# Patient Record
Sex: Male | Born: 1990
Health system: Southern US, Community
[De-identification: ages and names within clinical notes are randomized; demographics above are authoritative.]

## PROBLEM LIST (undated history)

## (undated) DIAGNOSIS — L309 Dermatitis, unspecified: Secondary | ICD-10-CM

## (undated) DIAGNOSIS — Z21 Asymptomatic human immunodeficiency virus [HIV] infection status: Secondary | ICD-10-CM

## (undated) DIAGNOSIS — J45909 Unspecified asthma, uncomplicated: Secondary | ICD-10-CM

## (undated) DIAGNOSIS — B20 Human immunodeficiency virus [HIV] disease: Secondary | ICD-10-CM

## (undated) HISTORY — DX: Unspecified asthma, uncomplicated: J45.909

## (undated) HISTORY — DX: Asymptomatic human immunodeficiency virus (hiv) infection status: Z21

## (undated) HISTORY — DX: Human immunodeficiency virus (HIV) disease: B20

## (undated) HISTORY — PX: COSMETIC SURGERY: SHX468

---
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
## 2009-06-11 ENCOUNTER — Emergency Department (HOSPITAL_COMMUNITY): Admission: EM | Admit: 2009-06-11 | Discharge: 2009-06-11 | Payer: Self-pay | Admitting: Family Medicine

## 2009-12-23 ENCOUNTER — Emergency Department (HOSPITAL_COMMUNITY): Admission: EM | Admit: 2009-12-23 | Discharge: 2009-12-23 | Payer: Self-pay | Admitting: Emergency Medicine

## 2009-12-31 ENCOUNTER — Encounter: Admission: RE | Admit: 2009-12-31 | Discharge: 2009-12-31 | Payer: Self-pay | Admitting: Pediatrics

## 2011-11-01 ENCOUNTER — Ambulatory Visit (INDEPENDENT_AMBULATORY_CARE_PROVIDER_SITE_OTHER): Payer: 59

## 2011-11-01 DIAGNOSIS — S339XXA Sprain of unspecified parts of lumbar spine and pelvis, initial encounter: Secondary | ICD-10-CM

## 2011-11-01 DIAGNOSIS — S63509A Unspecified sprain of unspecified wrist, initial encounter: Secondary | ICD-10-CM

## 2012-03-21 ENCOUNTER — Ambulatory Visit: Payer: Self-pay | Admitting: Internal Medicine

## 2014-01-02 ENCOUNTER — Ambulatory Visit: Payer: 59 | Admitting: Physician Assistant

## 2014-01-02 VITALS — BP 118/64 | HR 83 | Temp 98.2°F | Resp 16 | Ht 67.0 in | Wt 154.8 lb

## 2014-01-02 DIAGNOSIS — Z0289 Encounter for other administrative examinations: Secondary | ICD-10-CM

## 2014-01-02 NOTE — Progress Notes (Signed)
   Subjective:    Patient ID: Jay Smith, male    DOB: [birth year], 23 y.o.   MRN: [id number]  HPI   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  He reports he is in good health.  He does have a history of asthma - currently treat with prn albuterol only.  He reports he uses albuterol rarely - maybe a couple times per year.  His inhaler is still in date, and he does carry it with him.  He is not taking any other medication.    He does wear glasses.  Last eye exam 3 months ago  No surgeries.  No hx heart disease, diabetes, seizure, syncope  No smoker.  Rare etoh - maybe 3 drinks at a time 1x/month.  No other substances   Review of Systems  Constitutional: Negative.   HENT: Negative.   Respiratory: Negative.   Cardiovascular: Negative.   Musculoskeletal: Negative.   Skin: Negative.        Objective:   Physical Exam  Vitals reviewed. Constitutional: He is oriented to person, place, and time. He appears well-developed and well-nourished. No distress.  HENT:  Head: Normocephalic and atraumatic.  Right Ear: Tympanic membrane and ear canal normal.  Left Ear: Tympanic membrane and ear canal normal.  Mouth/Throat: Uvula is midline, oropharynx is clear and moist and mucous membranes are normal.  Eyes: Conjunctivae and EOM are normal. Pupils are equal, round, and reactive to light.  Neck: Normal range of motion. Neck supple.  Cardiovascular: Normal rate, regular rhythm, normal heart sounds and intact distal pulses.   Pulmonary/Chest: Effort normal and breath sounds normal. He has no wheezes. He has no rales.  Abdominal: Soft. Bowel sounds are normal. There is no tenderness. Hernia confirmed negative in the right inguinal area and confirmed negative in the left inguinal area.  Musculoskeletal: Normal range of motion. He exhibits no edema.  Lymphadenopathy:    He has no cervical adenopathy.  Neurological: He is alert and oriented to person, place, and time. He has  normal reflexes.  Skin: Skin is warm and dry.  Psychiatric: He has a normal mood and affect. His behavior is normal.     Visual Acuity Screening   Right eye Left eye Both eyes  Without correction:     With correction: 20/40 20/30 20/25   Comments: Peripheral Vision:Right eye 85 degrees.Left eye 85 degrees.The patient CAN distinguish the colors red, amber and green.  Hearing Screening Comments: The patient was able to hear a forced whisper from 10 feet.      Assessment & Plan:  Health examination of defined subpopulation   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  He reports he is in good health.  Uses albuterol rarely for asthma but carries his inhaler with him.  Exam is normal.  He is wearing corrective lenses.  Ok to certify for 2 yrs.  Card and PPW completed.  Follow up as needed   E. Frances FurbishElizabeth Alahia Whicker MHS, PA-C [facility] 3/26/201511:36 AM

## 2014-08-19 ENCOUNTER — Encounter (HOSPITAL_COMMUNITY): Payer: Self-pay | Admitting: *Deleted

## 2014-08-19 ENCOUNTER — Emergency Department (HOSPITAL_COMMUNITY)
Admission: EM | Admit: 2014-08-19 | Discharge: 2014-08-19 | Disposition: A | Discharge status: Home / Self Care | Payer: 59 | Source: Home / Self Care | Attending: Family Medicine | Admitting: Family Medicine

## 2014-08-19 DIAGNOSIS — J020 Streptococcal pharyngitis: Secondary | ICD-10-CM

## 2014-08-19 LAB — POCT RAPID STREP A: Streptococcus, Group A Screen (Direct): POSITIVE — AB

## 2014-08-19 MED ORDER — ACETAMINOPHEN 325 MG PO TABS
650.0000 mg | ORAL_TABLET | Freq: Once | ORAL | Status: AC
  Administered 2014-08-19: 650 mg via ORAL

## 2014-08-19 MED ORDER — ONDANSETRON HCL 4 MG PO TABS: 4.0000 mg | ORAL_TABLET | Freq: Four times a day (QID) | ORAL | Status: DC

## 2014-08-19 MED ORDER — ACETAMINOPHEN 325 MG PO TABS
ORAL_TABLET | ORAL | Status: AC
  Filled 2014-08-19: qty 2

## 2014-08-19 MED ORDER — MAGIC MOUTHWASH W/LIDOCAINE: 5.0000 mL | Freq: Four times a day (QID) | ORAL | Status: DC | PRN

## 2014-08-19 MED ORDER — AMOXICILLIN 500 MG PO CAPS: 500.0000 mg | ORAL_CAPSULE | Freq: Two times a day (BID) | ORAL | Status: DC

## 2014-08-19 NOTE — ED Provider Notes (Signed)
CSN: [id number]     Arrival date & time 08/19/14  1813 History   First MD Initiated Contact with Patient 08/19/14 1842     Chief Complaint  Patient presents with  . Fever   (Consider location/radiation/quality/duration/timing/severity/associated sxs/prior Treatment)  HPI   Patient is a 23 year old male presenting tonight with his mother with complaints of a sore throat and headache that began this morning. Patient reports positive nausea but no vomiting. States it "hurts all over". Patient is ill-appearing, but in no acute distress.  Patient has a history of asthma but denies any shortness of breath and states has not had to use his inhaler for the past several months. Patient is taken no Tylenol or ibuprofen prior to arrival for fever. Reports positive photophobia but no neck stiffness.  Past Medical History  Diagnosis Date  . Asthma    Past Surgical History  Procedure Laterality Date  . Cosmetic surgery     Family History  Problem Relation Age of Onset  . Diabetes Brother   . Diabetes Maternal Grandmother   . Hypertension Maternal Grandmother   . Diabetes Maternal Grandfather   . Hypertension Maternal Grandfather   . Heart disease Maternal Grandfather   . Diabetes Paternal Grandfather   . Hypertension Paternal Grandfather    History  Substance Use Topics  . Smoking status: Never Smoker   . Smokeless tobacco: Not on file  . Alcohol Use: No    Review of Systems  Constitutional: Positive for fever, chills and fatigue.  HENT: Negative for congestion, drooling, ear pain, facial swelling, postnasal drip, rhinorrhea and trouble swallowing.        Sore throat.  Eyes: Positive for photophobia. Negative for visual disturbance.  Respiratory: Negative.  Negative for shortness of breath and wheezing.   Cardiovascular: Negative.   Gastrointestinal: Positive for nausea. Negative for vomiting, abdominal pain and diarrhea.  Endocrine: Negative.   Genitourinary: Negative.    Musculoskeletal: Negative for gait problem, neck pain and neck stiffness.       Reports generalized body aches.  Skin: Negative.  Negative for color change, pallor, rash and wound.  Allergic/Immunologic: Negative.   Neurological: Positive for headaches. Negative for tremors, weakness, light-headedness and numbness.  Hematological: Negative.   Psychiatric/Behavioral: Negative.     Allergies  Hydrocodone  Home Medications   Prior to Admission medications   Medication Sig Start Date End Date Taking? Authorizing Provider  albuterol (PROVENTIL HFA;VENTOLIN HFA) 108 (90 BASE) MCG/ACT inhaler Inhale 2 puffs into the lungs every 6 (six) hours as needed for wheezing or shortness of breath.    Historical Provider, MD  Alum & Mag Hydroxide-Simeth (MAGIC MOUTHWASH W/LIDOCAINE) SOLN Take 5 mLs by mouth 4 (four) times daily as needed for mouth pain (Swish, gargle, and spit one to 2 teaspoonfuls every 6 hours as needed. May be swallowed if desired.). 08/19/14   Servando Salinaatherine H Rossi, NP  amoxicillin (AMOXIL) 500 MG capsule Take 1 capsule (500 mg total) by mouth 2 (two) times daily. 08/19/14   Servando Salinaatherine H Rossi, NP  ondansetron (ZOFRAN) 4 MG tablet Take 1 tablet (4 mg total) by mouth every 6 (six) hours. 08/19/14   Servando Salinaatherine H Rossi, NP   BP 124/82 mmHg  Pulse 110  Temp(Src) 102.6 F (39.2 C) (Oral)  Resp 20  SpO2 100%   Physical Exam  Constitutional: He is oriented to person, place, and time. He appears well-developed and well-nourished. No distress.  HENT:  Head: Normocephalic and atraumatic.  Right Ear: External ear normal.  Left Ear: External ear normal.  Nose: Nose normal.  Mouth/Throat: Oropharynx is clear and moist. No oropharyngeal exudate.  Posterior pharynx beefy red in appearance. No evidence of patches or exudate.    Eyes: Conjunctivae are normal. Pupils are equal, round, and reactive to light. Right eye exhibits no discharge. Left eye exhibits no discharge. No scleral icterus.  Neck:  Normal range of motion. Neck supple.  No nuchal rigidity. Mild bilateral cervical lymphadenopathy present.  Cardiovascular: Normal rate, regular rhythm, normal heart sounds and intact distal pulses.  Exam reveals no gallop and no friction rub.   No murmur heard. Pulmonary/Chest: Effort normal and breath sounds normal. No respiratory distress. He has no wheezes. He has no rales. He exhibits no tenderness.  No adventitious breath sounds noted.  Abdominal: Soft. Bowel sounds are normal. He exhibits no distension and no mass. There is no tenderness. There is no rebound and no guarding.  Musculoskeletal: Normal range of motion.  Lymphadenopathy:    He has cervical adenopathy.  Neurological: He is alert and oriented to person, place, and time.  Skin: Skin is warm and dry. He is not diaphoretic.  Nursing note and vitals reviewed.   ED Course  Procedures (including critical care time) Labs Review Labs Reviewed  POCT RAPID STREP A ([facility] ONLY) - Abnormal; Notable for the following:    Streptococcus, Group A Screen (Direct) POSITIVE (*)    All other components within normal limits    Imaging Review No results found.   MDM   1. Strep pharyngitis    Meds ordered this encounter  Medications  . acetaminophen (TYLENOL) tablet 650 mg    Sig:   . amoxicillin (AMOXIL) 500 MG capsule    Sig: Take 1 capsule (500 mg total) by mouth 2 (two) times daily.    Dispense:  20 capsule    Refill:  0  . ondansetron (ZOFRAN) 4 MG tablet    Sig: Take 1 tablet (4 mg total) by mouth every 6 (six) hours.    Dispense:  12 tablet    Refill:  0  . Alum & Mag Hydroxide-Simeth (MAGIC MOUTHWASH W/LIDOCAINE) SOLN    Sig: Take 5 mLs by mouth 4 (four) times daily as needed for mouth pain (Swish, gargle, and spit one to 2 teaspoonfuls every 6 hours as needed. May be swallowed if desired.).    Dispense:  120 mL    Refill:  0   Verified prescription for magic mouthwash over phone with pharmacist. Patient  declined offer of penicillin injection after reporting he would have trouble swallowing from throat discomfort.   The patient verbalizes understanding and agrees to plan of care.        Servando Salinaatherine H Rossi, NP 08/19/14 2028

## 2014-08-19 NOTE — Discharge Instructions (Signed)

## 2014-08-19 NOTE — ED Notes (Signed)
Pt  Reports    Fever    Body  Aches        Headache     And   sorethroat          With  Sudden  Onset  This  Am      Pt  Sitting  Upright on the  Exam table  Speaking  In  Complete  sentances             Pt  Has  A  History  Of  Asthma          Takes  Albuterol

## 2014-08-24 ENCOUNTER — Ambulatory Visit (INDEPENDENT_AMBULATORY_CARE_PROVIDER_SITE_OTHER): Payer: 59 | Admitting: Family Medicine

## 2014-08-24 VITALS — BP 113/74 | HR 56 | Temp 98.4°F | Resp 12 | Ht 65.25 in | Wt 147.2 lb

## 2014-08-24 DIAGNOSIS — K130 Diseases of lips: Secondary | ICD-10-CM

## 2014-08-24 DIAGNOSIS — G4452 New daily persistent headache (NDPH): Secondary | ICD-10-CM

## 2014-08-24 DIAGNOSIS — K0510 Chronic gingivitis, plaque induced: Secondary | ICD-10-CM

## 2014-08-24 MED ORDER — CEPHALEXIN 500 MG PO CAPS: 500.0000 mg | ORAL_CAPSULE | Freq: Three times a day (TID) | ORAL | Status: DC

## 2014-08-24 MED ORDER — BUTALBITAL-APAP-CAFFEINE 50-325-40 MG PO TABS: 1.0000 | ORAL_TABLET | Freq: Four times a day (QID) | ORAL | Status: DC | PRN

## 2014-08-24 NOTE — Patient Instructions (Signed)
Headaches, Frequently Asked Questions MIGRAINE HEADACHES Q: What is migraine? What causes it? How can I treat it? A: Generally, migraine headaches begin as a dull ache. Then they develop into a constant, throbbing, and pulsating pain. You may experience pain at the temples. You may experience pain at the front or back of one or both sides of the head. The pain is usually accompanied by a combination of:  Nausea.  Vomiting.  Sensitivity to light and noise. Some people (about 15%) experience an aura (see below) before an attack. The cause of migraine is believed to be chemical reactions in the brain. Treatment for migraine may include over-the-counter or prescription medications. It may also include self-help techniques. These include relaxation training and biofeedback.  Q: What is an aura? A: About 15% of people with migraine get an "aura". This is a sign of neurological symptoms that occur before a migraine headache. You may see wavy or jagged lines, dots, or flashing lights. You might experience tunnel vision or blind spots in one or both eyes. The aura can include visual or auditory hallucinations (something imagined). It may include disruptions in smell (such as strange odors), taste or touch. Other symptoms include:  Numbness.  A "pins and needles" sensation.  Difficulty in recalling or speaking the correct word. These neurological events may last as long as 60 minutes. These symptoms will fade as the headache begins. Q: What is a trigger? A: Certain physical or environmental factors can lead to or "trigger" a migraine. These include:  Foods.  Hormonal changes.  Weather.  Stress. It is important to remember that triggers are different for everyone. To help prevent migraine attacks, you need to figure out which triggers affect you. Keep a headache diary. This is a good way to track triggers. The diary will help you talk to your healthcare professional about your condition. Q: Does  weather affect migraines? A: Bright sunshine, hot, humid conditions, and drastic changes in barometric pressure may lead to, or "trigger," a migraine attack in some people. But studies have shown that weather does not act as a trigger for everyone with migraines. Q: What is the link between migraine and hormones? A: Hormones start and regulate many of your body's functions. Hormones keep your body in balance within a constantly changing environment. The levels of hormones in your body are unbalanced at times. Examples are during menstruation, pregnancy, or menopause. That can lead to a migraine attack. In fact, about three quarters of all women with migraine report that their attacks are related to the menstrual cycle.  Q: Is there an increased risk of stroke for migraine sufferers? A: The likelihood of a migraine attack causing a stroke is very remote. That is not to say that migraine sufferers cannot have a stroke associated with their migraines. In persons under age 40, the most common associated factor for stroke is migraine headache. But over the course of a person's normal life span, the occurrence of migraine headache may actually be associated with a reduced risk of dying from cerebrovascular disease due to stroke.  Q: What are acute medications for migraine? A: Acute medications are used to treat the pain of the headache after it has started. Examples over-the-counter medications, NSAIDs, ergots, and triptans.  Q: What are the triptans? A: Triptans are the newest class of abortive medications. They are specifically targeted to treat migraine. Triptans are vasoconstrictors. They moderate some chemical reactions in the brain. The triptans work on receptors in your brain. Triptans help   to restore the balance of a neurotransmitter called serotonin. Fluctuations in levels of serotonin are thought to be a main cause of migraine.  Q: Are over-the-counter medications for migraine effective? A:  Over-the-counter, or "OTC," medications may be effective in relieving mild to moderate pain and associated symptoms of migraine. But you should see your caregiver before beginning any treatment regimen for migraine.  Q: What are preventive medications for migraine? A: Preventive medications for migraine are sometimes referred to as "prophylactic" treatments. They are used to reduce the frequency, severity, and length of migraine attacks. Examples of preventive medications include antiepileptic medications, antidepressants, beta-blockers, calcium channel blockers, and NSAIDs (nonsteroidal anti-inflammatory drugs). Q: Why are anticonvulsants used to treat migraine? A: During the past few years, there has been an increased interest in antiepileptic drugs for the prevention of migraine. They are sometimes referred to as "anticonvulsants". Both epilepsy and migraine may be caused by similar reactions in the brain.  Q: Why are antidepressants used to treat migraine? A: Antidepressants are typically used to treat people with depression. They may reduce migraine frequency by regulating chemical levels, such as serotonin, in the brain.  Q: What alternative therapies are used to treat migraine? A: The term "alternative therapies" is often used to describe treatments considered outside the scope of conventional Western medicine. Examples of alternative therapy include acupuncture, acupressure, and yoga. Another common alternative treatment is herbal therapy. Some herbs are believed to relieve headache pain. Always discuss alternative therapies with your caregiver before proceeding. Some herbal products contain arsenic and other toxins. TENSION HEADACHES Q: What is a tension-type headache? What causes it? How can I treat it? A: Tension-type headaches occur randomly. They are often the result of temporary stress, anxiety, fatigue, or anger. Symptoms include soreness in your temples, a tightening band-like sensation  around your head (a "vice-like" ache). Symptoms can also include a pulling feeling, pressure sensations, and contracting head and neck muscles. The headache begins in your forehead, temples, or the back of your head and neck. Treatment for tension-type headache may include over-the-counter or prescription medications. Treatment may also include self-help techniques such as relaxation training and biofeedback. CLUSTER HEADACHES Q: What is a cluster headache? What causes it? How can I treat it? A: Cluster headache gets its name because the attacks come in groups. The pain arrives with little, if any, warning. It is usually on one side of the head. A tearing or bloodshot eye and a runny nose on the same side of the headache may also accompany the pain. Cluster headaches are believed to be caused by chemical reactions in the brain. They have been described as the most severe and intense of any headache type. Treatment for cluster headache includes prescription medication and oxygen. SINUS HEADACHES Q: What is a sinus headache? What causes it? How can I treat it? A: When a cavity in the bones of the face and skull (a sinus) becomes inflamed, the inflammation will cause localized pain. This condition is usually the result of an allergic reaction, a tumor, or an infection. If your headache is caused by a sinus blockage, such as an infection, you will probably have a fever. An x-ray will confirm a sinus blockage. Your caregiver's treatment might include antibiotics for the infection, as well as antihistamines or decongestants.  REBOUND HEADACHES Q: What is a rebound headache? What causes it? How can I treat it? A: A pattern of taking acute headache medications too often can lead to a condition known as "rebound headache."   A pattern of taking too much headache medication includes taking it more than 2 days per week or in excessive amounts. That means more than the label or a caregiver advises. With rebound  headaches, your medications not only stop relieving pain, they actually begin to cause headaches. Doctors treat rebound headache by tapering the medication that is being overused. Sometimes your caregiver will gradually substitute a different type of treatment or medication. Stopping may be a challenge. Regularly overusing a medication increases the potential for serious side effects. Consult a caregiver if you regularly use headache medications more than 2 days per week or more than the label advises. ADDITIONAL QUESTIONS AND ANSWERS Q: What is biofeedback? A: Biofeedback is a self-help treatment. Biofeedback uses special equipment to monitor your body's involuntary physical responses. Biofeedback monitors:  Breathing.  Pulse.  Heart rate.  Temperature.  Muscle tension.  Brain activity. Biofeedback helps you refine and perfect your relaxation exercises. You learn to control the physical responses that are related to stress. Once the technique has been mastered, you do not need the equipment any more. Q: Are headaches hereditary? A: Four out of five (80%) of people that suffer report a family history of migraine. Scientists are not sure if this is genetic or a family predisposition. Despite the uncertainty, a child has a 50% chance of having migraine if one parent suffers. The child has a 75% chance if both parents suffer.  Q: Can children get headaches? A: By the time they reach high school, most young people have experienced some type of headache. Many safe and effective approaches or medications can prevent a headache from occurring or stop it after it has begun.  Q: What type of doctor should I see to diagnose and treat my headache? A: Start with your primary caregiver. Discuss his or her experience and approach to headaches. Discuss methods of classification, diagnosis, and treatment. Your caregiver may decide to recommend you to a headache specialist, depending upon your symptoms or other  physical conditions. Having diabetes, allergies, etc., may require a more comprehensive and inclusive approach to your headache. The National Headache Foundation will provide, upon request, a list of [facility] physician members in your state. Document Released: 12/17/2003 Document Revised: 12/19/2011 Document Reviewed: 05/26/2008 [facility] Patient Information 2015 [employer], [place]. This information is not intended to replace advice given to you by your health care provider. Make sure you discuss any questions you have with your health care provider. Gingivitis Gingivitis is a form of gum (periodontal) disease that causes redness, soreness, and swelling (inflammation) of your gums. CAUSES The most common cause of gingivitis is poor oral hygiene. A sticky substance made of bacteria, mucus, and food particles (plaque), is deposited on the exposed part of teeth. As plaque builds up, it reacts with the saliva in your mouth to form something called  tartar. Tartar is a hard deposit that becomes trapped around the base of the tooth. Plaque and tartar irritate the gums, leading to the formation of gingivitis. Other factors that increase your risk for gingivitis include:   Tobacco use.  Diabetes.  Older age.  Certain medications.  Certain viral or fungal infections.  Dry mouth.  Hormonal changes such as during pregnancy.  Poor nutrition.  Substance abuse.  Poor fitting dental restorations or appliances. SYMPTOMS You may notice inflammation of the soft tissue (gingiva) around the teeth. When these tissues become inflamed, they bleed easily, especially during flossing or brushing. The gums may also be:   Tender to the  touch.  Bright red, purple red, or have a shiny appearance.  Swollen.  Wearing away from the teeth (receding), which exposes more of the tooth. Bad breath is often present. Continued infection around teeth can eventually cause cavities and loosen teeth. This may lead to eventual tooth  loss. DIAGNOSIS A medical and dental history will be taken. Your mouth, teeth, and gums will be examined. Your dentist will look for soft, swollen purple-red, irritated gums. There may be deposits of plaque and tartar at the base of the teeth. Your gums will be looked at for the degree of redness, puffiness, and bleeding tendencies. Your dentist will see if any of the teeth are loose. X-rays may be taken to see if the inflammation has spread to the supporting structures of the teeth. TREATMENT The goal is to reduce and reverse the inflammation. Proper treatment can usually reverse the symptoms of gingivitis and prevent further progression of the disease. Have your teeth cleaned. During the cleaning, all plaque and tartar will be removed. Instruction for proper home care will be given. You will need regular professional cleanings and check-ups in the future. HOME CARE INSTRUCTIONS  Brush your teeth twice a day and floss at least once per day. When flossing, it is best to floss first then brush.  Limit sugar between meals and maintain a well-balanced diet.  Even the best dental hygiene will not prevent plaque from developing. It is necessary for you to see your dentist on a regular basis for cleaning and regular checkups.  Your dentist can recommend proper oral hygiene and mouth care and suggest special toothpastes or mouth rinses.  Stop smoking. SEEK DENTAL OR MEDICAL CARE IF:  You have painful, reddened tissue around your teeth, or you have puffy swollen gums.  You have difficulty chewing.  You notice any loose or infected teeth.  You have swollen glands.  Your gums bleed easily when you brush your teeth or are very tender to the touch. Document Released: 03/22/2001 Document Revised: 12/19/2011 Document Reviewed: 12/31/2010 [facility] Patient Information 2015 [place], [place]. This information is not intended to replace advice given to you by your health care provider. Make sure you discuss  any questions you have with your health care provider.

## 2014-08-24 NOTE — Progress Notes (Signed)
Subjective:    Patient ID: Jay Smith, male    DOB: 1991-01-03, 23 y.o.   MRN: [id number]  HPI Chief Complaint  Patient presents with   Headache    still not better from last visit   This chart was scribed for Elvina SidleKurt Lauenstein, MD by Andrew Auaven Small, ED Scribe. This patient was seen in room 14 and the patient's care was started at 4:21 PM.  HPI Comments: Jay Smith is a 23 y.o. male who presents to the [facility] complaining of a HA that began about 5 days ago.  Pt reports symptoms began with a sore throat that worsened to head pain that pt states is worse than a HA, gum irritation, and a sore at the corner of his mouth and nausea. Pt was seen at urgent care 5 days and was prescribed amoxicillin for strep throat without relief. Pt has also tried taken tylenol, aleve, 800 mg ibuprofen, and Excedrin without relief. Pt denies emesis and cough . Pt is allergic to hydrocodone  Pt works at [employer].   Past Medical History  Diagnosis Date   Asthma    Allergies  Allergen Reactions   Hydrocodone Swelling   Prior to Admission medications   Medication Sig Start Date End Date Taking? Authorizing Provider  albuterol (PROVENTIL HFA;VENTOLIN HFA) 108 (90 BASE) MCG/ACT inhaler Inhale 2 puffs into the lungs every 6 (six) hours as needed for wheezing or shortness of breath.   Yes Historical Provider, MD  Alum & Mag Hydroxide-Simeth (MAGIC MOUTHWASH W/LIDOCAINE) SOLN Take 5 mLs by mouth 4 (four) times daily as needed for mouth pain (Swish, gargle, and spit one to 2 teaspoonfuls every 6 hours as needed. May be swallowed if desired.). 08/19/14  Yes Servando Salinaatherine H Rossi, NP  amoxicillin (AMOXIL) 500 MG capsule Take 1 capsule (500 mg total) by mouth 2 (two) times daily. 08/19/14  Yes Servando Salinaatherine H Rossi, NP  ondansetron (ZOFRAN) 4 MG tablet Take 1 tablet (4 mg total) by mouth every 6 (six) hours. 08/19/14  Yes Servando Salinaatherine H Rossi, NP    Review of Systems  HENT:  Positive for sore throat. Negative for sinus pressure.   Respiratory: Negative for cough.   Gastrointestinal: Positive for nausea. Negative for vomiting.  Neurological: Positive for headaches.       Objective:   Physical Exam  Constitutional: He is oriented to person, place, and time. He appears well-developed and well-nourished. No distress.  HENT:  Head: Normocephalic and atraumatic.  Cheilitis at corner of right mouth. Pt also has gingivitis.   Eyes: Conjunctivae and EOM are normal.  Neck: Neck supple.  Cardiovascular: Normal rate.   Pulmonary/Chest: Effort normal.  Musculoskeletal: Normal range of motion.  Neurological: He is alert and oriented to person, place, and time.  Skin: Skin is warm and dry.  Psychiatric: He has a normal mood and affect. His behavior is normal.  Nursing note and vitals reviewed.  Filed Vitals:   08/24/14 1556  BP: 113/74  Pulse: 56  Temp: 98.4 F (36.9 C)  Resp: 12    Assessment & Plan:   1. Gingivitis   2. Angular cheilitis   3. New daily persistent headache    Meds ordered this encounter  Medications   cephALEXin (KEFLEX) 500 MG capsule    Sig: Take 1 capsule (500 mg total) by mouth 3 (three) times daily.    Dispense:  21 capsule    Refill:  0   butalbital-acetaminophen-caffeine (FIORICET) 50-325-40  MG per tablet    Sig: Take 1-2 tablets by mouth every 6 (six) hours as needed for headache.    Dispense:  12 tablet    Refill:  0    I personally performed the services described in this documentation, which was scribed in my presence. The recorded information has been reviewed and is accurate.  Elvina SidleKurt Lauenstein, MD

## 2015-02-12 ENCOUNTER — Emergency Department (HOSPITAL_BASED_OUTPATIENT_CLINIC_OR_DEPARTMENT_OTHER): Payer: 59

## 2015-02-12 ENCOUNTER — Encounter (HOSPITAL_BASED_OUTPATIENT_CLINIC_OR_DEPARTMENT_OTHER): Payer: Self-pay | Admitting: *Deleted

## 2015-02-12 ENCOUNTER — Emergency Department (HOSPITAL_BASED_OUTPATIENT_CLINIC_OR_DEPARTMENT_OTHER)
Admission: EM | Admit: 2015-02-12 | Discharge: 2015-02-12 | Disposition: A | Discharge status: Home / Self Care | Payer: 59 | Attending: Emergency Medicine | Admitting: Emergency Medicine

## 2015-02-12 DIAGNOSIS — Y998 Other external cause status: Secondary | ICD-10-CM | POA: Insufficient documentation

## 2015-02-12 DIAGNOSIS — Y9289 Other specified places as the place of occurrence of the external cause: Secondary | ICD-10-CM | POA: Diagnosis not present

## 2015-02-12 DIAGNOSIS — Z79899 Other long term (current) drug therapy: Secondary | ICD-10-CM | POA: Diagnosis not present

## 2015-02-12 DIAGNOSIS — Z792 Long term (current) use of antibiotics: Secondary | ICD-10-CM | POA: Diagnosis not present

## 2015-02-12 DIAGNOSIS — X58XXXA Exposure to other specified factors, initial encounter: Secondary | ICD-10-CM | POA: Diagnosis not present

## 2015-02-12 DIAGNOSIS — R21 Rash and other nonspecific skin eruption: Secondary | ICD-10-CM | POA: Diagnosis present

## 2015-02-12 DIAGNOSIS — Z872 Personal history of diseases of the skin and subcutaneous tissue: Secondary | ICD-10-CM | POA: Insufficient documentation

## 2015-02-12 DIAGNOSIS — S59902A Unspecified injury of left elbow, initial encounter: Secondary | ICD-10-CM | POA: Insufficient documentation

## 2015-02-12 DIAGNOSIS — J45909 Unspecified asthma, uncomplicated: Secondary | ICD-10-CM | POA: Insufficient documentation

## 2015-02-12 DIAGNOSIS — Y9389 Activity, other specified: Secondary | ICD-10-CM | POA: Insufficient documentation

## 2015-02-12 HISTORY — DX: Dermatitis, unspecified: L30.9

## 2015-02-12 LAB — WET PREP, GENITAL
Clue Cells Wet Prep HPF POC: NONE SEEN
Trich, Wet Prep: NONE SEEN
WBC, Wet Prep HPF POC: NONE SEEN
Yeast Wet Prep HPF POC: NONE SEEN

## 2015-02-12 MED ORDER — PENICILLIN G BENZATHINE 1200000 UNIT/2ML IM SUSP
2.4000 10*6.[IU] | Freq: Once | INTRAMUSCULAR | Status: AC
  Administered 2015-02-12: 2.4 10*6.[IU] via INTRAMUSCULAR
  Filled 2015-02-12: qty 4

## 2015-02-12 MED ORDER — HYDROCORTISONE 2.5 % EX LOTN: TOPICAL_LOTION | Freq: Two times a day (BID) | CUTANEOUS | Status: DC

## 2015-02-12 NOTE — ED Provider Notes (Signed)
CSN: [id number]     Arrival date & time 02/12/15  1755 History   First MD Initiated Contact with Patient 02/12/15 1833     Chief Complaint  Patient presents with  . Rash     (Consider location/radiation/quality/duration/timing/severity/associated sxs/prior Treatment) HPI    PCP: No PCP Per Patient Blood pressure 117/59, pulse 63, temperature 98.5 F (36.9 C), temperature source Oral, resp. rate 18, height 5\' 7"  (1.702 m), weight 147 lb (66.679 kg), SpO2 100 %.  Jay Smith is a 24 y.o.male with a significant PMH of  Asthma and eczema presents to the ER with complaints of  Rash to his head, chest, back and inguinal region. The rash started 2 weeks ago and has been progressively worsening. It does not cause any pain but can sometimes be itchy. He has not had any fevers with it. He is sexually active with males but reports always using protection with  Condoms. He denies having any penile discharge, penile or testicular pain. He denies having had this rash before. He is also complaining of his left arm  Having decreased range of motion. Denies any pain or injury.  Negative Review of Symptoms:  Fevers, nausea, vomiting, diarrhea, abdominal pain, headaches, neck pain, lower chimney swelling, rectal pain , abnormal bleeding.     Past Medical History  Diagnosis Date  . Asthma   . Eczema    Past Surgical History  Procedure Laterality Date  . Cosmetic surgery     Family History  Problem Relation Age of Onset  . Diabetes Brother   . Diabetes Maternal Grandmother   . Hypertension Maternal Grandmother   . Diabetes Maternal Grandfather   . Hypertension Maternal Grandfather   . Heart disease Maternal Grandfather   . Diabetes Paternal Grandfather   . Hypertension Paternal Grandfather    History  Substance Use Topics  . Smoking status: Never Smoker   . Smokeless tobacco: Not on file  . Alcohol Use: No    Review of Systems  10 Systems reviewed and are negative for acute  change except as noted in the HPI.    Allergies  Hydrocodone  Home Medications   Prior to Admission medications   Medication Sig Start Date End Date Taking? Authorizing Provider  albuterol (PROVENTIL HFA;VENTOLIN HFA) 108 (90 BASE) MCG/ACT inhaler Inhale 2 puffs into the lungs every 6 (six) hours as needed for wheezing or shortness of breath.    Historical Provider, MD  Alum & Mag Hydroxide-Simeth (MAGIC MOUTHWASH W/LIDOCAINE) SOLN Take 5 mLs by mouth 4 (four) times daily as needed for mouth pain (Swish, gargle, and spit one to 2 teaspoonfuls every 6 hours as needed. May be swallowed if desired.). 08/19/14   Servando Salinaatherine H Rossi, NP  butalbital-acetaminophen-caffeine (FIORICET) [id number] MG per tablet Take 1-2 tablets by mouth every 6 (six) hours as needed for headache. 08/24/14 08/24/15  Elvina SidleKurt Lauenstein, MD  cephALEXin (KEFLEX) 500 MG capsule Take 1 capsule (500 mg total) by mouth 3 (three) times daily. 08/24/14   Elvina SidleKurt Lauenstein, MD  hydrocortisone 2.5 % lotion Apply topically 2 (two) times daily. 02/12/15   Buren Havey Neva SeatGreene, PA-C  ondansetron (ZOFRAN) 4 MG tablet Take 1 tablet (4 mg total) by mouth every 6 (six) hours. 08/19/14   Servando Salinaatherine H Rossi, NP   BP 117/59 mmHg  Pulse 63  Temp(Src) 98.5 F (36.9 C) (Oral)  Resp 18  Ht 5\' 7"  (1.702 m)  Wt 147 lb (66.679 kg)  BMI 23.02 kg/m2  SpO2 100% Physical Exam  Constitutional: He appears well-developed and well-nourished. No distress.  HENT:  Head: Normocephalic and atraumatic.  Eyes: Pupils are equal, round, and reactive to light.  Neck: Normal range of motion. Neck supple.  Cardiovascular: Normal rate and regular rhythm.   Pulmonary/Chest: Effort normal.  Abdominal: Soft.  Genitourinary: Testes normal and penis normal.  Musculoskeletal:       Left elbow: He exhibits decreased range of motion. He exhibits no swelling, no effusion, no deformity and no laceration. No tenderness found. No radial head, no medial epicondyle, no lateral  epicondyle and no olecranon process tenderness noted.  Pt has no pain, swelling, induration or rash to left elbow. He will not allow the arm to straighten past 90 degrees extension. No other abnormal findings.  Neurological: He is alert.  Skin: Skin is warm and dry. Rash (large macular rash that do not coalesce to chest, scalp, face, hands, legs, inguinal region, back) noted.  Nursing note and vitals reviewed.   ED Course  Procedures (including critical care time) Labs Review Labs Reviewed  WET PREP, GENITAL  RPR  HIV ANTIBODY (ROUTINE TESTING)  GC/CHLAMYDIA PROBE AMP ([place])    Imaging Review Dg Elbow Complete Left  02/12/2015   CLINICAL DATA:  Left arm pain after waking up this morning.  EXAM: LEFT ELBOW - COMPLETE 3+ VIEW  COMPARISON:  None.  FINDINGS: There is no evidence of fracture, dislocation, or joint effusion. There is no evidence of arthropathy or other focal bone abnormality. Soft tissues are unremarkable.  IMPRESSION: Negative.   Electronically Signed   By: Elige KoHetal  Patel   On: 02/12/2015 19:33     EKG Interpretation None      MDM   Final diagnoses:  Rash  Elbow injury, left, initial encounter    Clinical concern for possible syphilis, will treat with 2.4 mg Penicillin. RPR, HIV, GC and wet prep done - pending Will start patient on hydrocortisone cream for home to place on rash as well.  Given referral to Dr. Victorino DikeHewitt for elbow lock. No signs of infection, injury, trauma. Unsure of why the arm won't straighten. Put him in a shoulder sling and he will f/u. Advised to return ASAP if he  Develops pain, redness, swelling, decreased ROM.  23 y.o.Jay Smith's evaluation in the Emergency Department is complete. It has been determined that no acute conditions requiring further emergency intervention are present at this time. The patient/guardian have been advised of the diagnosis and plan. We have discussed signs and symptoms that warrant return to the ED, such  as changes or worsening in symptoms.  Vital signs are stable at discharge. Filed Vitals:   02/12/15 1938  BP: 117/59  Pulse: 63  Temp:   Resp: 18    Patient/guardian has voiced understanding and agreed to follow-up with the PCP or specialist.   Marlon Peliffany Jemell Town, PA-C 02/12/15 [phone]  Vanetta MuldersScott Zackowski, MD 02/13/15 1354

## 2015-02-12 NOTE — ED Notes (Signed)
Patient transported to X-ray 

## 2015-02-12 NOTE — Discharge Instructions (Signed)

## 2015-02-12 NOTE — ED Notes (Addendum)
Rash to his face and chest for almost 2 weeks. Hx of eczema. States he is unable to extend his left arm. No injury.

## 2015-02-13 LAB — GC/CHLAMYDIA PROBE AMP (~~LOC~~) NOT AT ARMC
CHLAMYDIA, DNA PROBE: NEGATIVE
NEISSERIA GONORRHEA: NEGATIVE

## 2015-02-14 LAB — HIV ANTIBODY (ROUTINE TESTING W REFLEX): HIV Screen 4th Generation wRfx: NONREACTIVE

## 2015-02-17 ENCOUNTER — Telehealth (HOSPITAL_BASED_OUTPATIENT_CLINIC_OR_DEPARTMENT_OTHER): Payer: Self-pay | Admitting: Emergency Medicine

## 2015-02-17 LAB — RPR, QUANT+TP ABS (REFLEX)
Rapid Plasma Reagin, Quant: 1:256 {titer} — ABNORMAL HIGH
TREPONEMA PALLIDUM AB: POSITIVE — AB

## 2015-02-17 LAB — RPR: RPR Ser Ql: REACTIVE — AB

## 2015-02-18 ENCOUNTER — Telehealth (HOSPITAL_BASED_OUTPATIENT_CLINIC_OR_DEPARTMENT_OTHER): Payer: Self-pay | Admitting: Emergency Medicine

## 2015-02-18 NOTE — Telephone Encounter (Signed)
+   RPR , did receive PCN 2.4 million units once, will notify pt

## 2015-02-18 NOTE — Telephone Encounter (Signed)
notiifed + RPR , did receive Bicillin in the ED, edcuated re abstinence and notification of sexual partner(s)

## 2015-03-18 ENCOUNTER — Emergency Department (HOSPITAL_BASED_OUTPATIENT_CLINIC_OR_DEPARTMENT_OTHER)
Admission: EM | Admit: 2015-03-18 | Discharge: 2015-03-18 | Disposition: A | Discharge status: Home / Self Care | Payer: 59 | Attending: Emergency Medicine | Admitting: Emergency Medicine

## 2015-03-18 ENCOUNTER — Encounter (HOSPITAL_BASED_OUTPATIENT_CLINIC_OR_DEPARTMENT_OTHER): Payer: Self-pay | Admitting: *Deleted

## 2015-03-18 ENCOUNTER — Emergency Department (HOSPITAL_BASED_OUTPATIENT_CLINIC_OR_DEPARTMENT_OTHER): Payer: 59

## 2015-03-18 DIAGNOSIS — Y9289 Other specified places as the place of occurrence of the external cause: Secondary | ICD-10-CM | POA: Insufficient documentation

## 2015-03-18 DIAGNOSIS — J45909 Unspecified asthma, uncomplicated: Secondary | ICD-10-CM | POA: Diagnosis not present

## 2015-03-18 DIAGNOSIS — S99921A Unspecified injury of right foot, initial encounter: Secondary | ICD-10-CM | POA: Diagnosis present

## 2015-03-18 DIAGNOSIS — S92421A Displaced fracture of distal phalanx of right great toe, initial encounter for closed fracture: Secondary | ICD-10-CM | POA: Insufficient documentation

## 2015-03-18 DIAGNOSIS — Y9389 Activity, other specified: Secondary | ICD-10-CM | POA: Diagnosis not present

## 2015-03-18 DIAGNOSIS — Y998 Other external cause status: Secondary | ICD-10-CM | POA: Insufficient documentation

## 2015-03-18 DIAGNOSIS — W208XXA Other cause of strike by thrown, projected or falling object, initial encounter: Secondary | ICD-10-CM | POA: Diagnosis not present

## 2015-03-18 DIAGNOSIS — Z872 Personal history of diseases of the skin and subcutaneous tissue: Secondary | ICD-10-CM | POA: Diagnosis not present

## 2015-03-18 DIAGNOSIS — S92911A Unspecified fracture of right toe(s), initial encounter for closed fracture: Secondary | ICD-10-CM

## 2015-03-18 MED ORDER — IBUPROFEN 800 MG PO TABS
800.0000 mg | ORAL_TABLET | Freq: Once | ORAL | Status: AC
  Administered 2015-03-18: 800 mg via ORAL
  Filled 2015-03-18: qty 1

## 2015-03-18 MED ORDER — TRAMADOL HCL 50 MG PO TABS: 50.0000 mg | ORAL_TABLET | Freq: Four times a day (QID) | ORAL | Status: DC | PRN

## 2015-03-18 MED ORDER — TRAMADOL HCL 50 MG PO TABS
50.0000 mg | ORAL_TABLET | Freq: Once | ORAL | Status: AC
  Administered 2015-03-18: 50 mg via ORAL
  Filled 2015-03-18: qty 1

## 2015-03-18 NOTE — Discharge Instructions (Signed)
Please follow up with your primary care physician in 1-2 days. If you do not have one please call the [facility] number listed above. Please follow up with Dr. Victorino Dikehewitt to schedule a follow up appointment.  Please take pain medication and/or muscle relaxants as prescribed and as needed for pain. Please do not drive on narcotic pain medication or on muscle relaxants. Please read all discharge instructions and return precautions.   Toe Fracture Your caregiver has diagnosed you as having a fractured toe. A toe fracture is a break in the bone of a toe. "Buddy taping" is a way of splinting your broken toe, by taping the broken toe to the toe next to it. This "buddy taping" will keep the injured toe from moving beyond normal range of motion. Buddy taping also helps the toe heal in a more normal alignment. It may take 6 to 8 weeks for the toe injury to heal. HOME CARE INSTRUCTIONS   Leave your toes taped together for as long as directed by your caregiver or until you see a doctor for a follow-up examination. You can change the tape after bathing. Always use a small piece of gauze or cotton between the toes when taping them together. This will help the skin stay dry and prevent infection.  Apply ice to the injury for 15-20 minutes each hour while awake for the first 2 days. Put the ice in a plastic bag and place a towel between the bag of ice and your skin.  After the first 2 days, apply heat to the injured area. Use heat for the next 2 to 3 days. Place a heating pad on the foot or soak the foot in warm water as directed by your caregiver.  Keep your foot elevated as much as possible to lessen swelling.  Wear sturdy, supportive shoes. The shoes should not pinch the toes or fit tightly against the toes.  Your caregiver may prescribe a rigid shoe if your foot is very swollen.  Your may be given crutches if the pain is too great and it hurts too much to walk.  Only take over-the-counter  or prescription medicines for pain, discomfort, or fever as directed by your caregiver.  If your caregiver has given you a follow-up appointment, it is very important to keep that appointment. Not keeping the appointment could result in a chronic or permanent injury, pain, and disability. If there is any problem keeping the appointment, you must call back to this facility for assistance. SEEK MEDICAL CARE IF:   You have increased pain or swelling, not relieved with medications.  The pain does not get better after 1 week.  Your injured toe is cold when the others are warm. SEEK IMMEDIATE MEDICAL CARE IF:   The toe becomes cold, numb, or white.  The toe becomes hot (inflamed) and red. Document Released: 09/23/2000 Document Revised: 12/19/2011 Document Reviewed: 05/12/2008 [facility] Patient Information 2015 [employer] [place]. This information is not intended to replace advice given to you by your health care provider. Make sure you discuss any questions you have with your health care provider.

## 2015-03-18 NOTE — ED Notes (Signed)
Patient transported to X-ray 

## 2015-03-18 NOTE — ED Notes (Signed)
States he dropped 50# propane tank on his right foot. Bruising noted. 2+ pedals.

## 2015-03-18 NOTE — ED Provider Notes (Signed)
CSN: [id number]     Arrival date & time 03/18/15  1804 History   First MD Initiated Contact with Patient 03/18/15 1805     No chief complaint on file.    (Consider location/radiation/quality/duration/timing/severity/associated sxs/prior Treatment) HPI Comments: Patient is a 24 yo M presenting to the ED for evaluation of right foot pain after he dropped a 50# propane tank on his foot earlier this afternoon. He was wearing shoes at the time. He had immediate pain and has since noticed swelling and bruising. He took tylenol with little to no improvement. No modifying factors identified. No previous fractures to the foot in the past.   Patient is a 24 y.o. male presenting with foot injury. The history is provided by the patient.  Foot Injury Location:  Foot Injury: yes   Foot location:  R foot Pain details:    Radiates to:  Does not radiate   Onset quality:  Sudden   Timing:  Constant Chronicity:  New Prior injury to area:  No Relieved by:  Acetaminophen Worsened by:  Bearing weight Ineffective treatments:  None tried Associated symptoms: swelling   Associated symptoms: no fever and no tingling     Past Medical History  Diagnosis Date  . Asthma   . Eczema    Past Surgical History  Procedure Laterality Date  . Cosmetic surgery     Family History  Problem Relation Age of Onset  . Diabetes Brother   . Diabetes Maternal Grandmother   . Hypertension Maternal Grandmother   . Diabetes Maternal Grandfather   . Hypertension Maternal Grandfather   . Heart disease Maternal Grandfather   . Diabetes Paternal Grandfather   . Hypertension Paternal Grandfather    History  Substance Use Topics  . Smoking status: Never Smoker   . Smokeless tobacco: Not on file  . Alcohol Use: No    Review of Systems  Constitutional: Negative for fever.  Skin: Positive for color change and wound.  All other systems reviewed and are negative.     Allergies  Hydrocodone  Home Medications    Prior to Admission medications   Medication Sig Start Date End Date Taking? Authorizing Provider  traMADol (ULTRAM) 50 MG tablet Take 1 tablet (50 mg total) by mouth every 6 (six) hours as needed. 03/18/15   Arhan Mcmanamon, PA-C   BP 112/63 mmHg  Pulse 78  Temp(Src) 99.5 F (37.5 C) (Oral)  Resp 18  Ht  (1.702 m)  Wt 150 lb (68.04 kg)  BMI 23.49 kg/m2  SpO2 99% Physical Exam  Constitutional: He is oriented to person, place, and time. He appears well-developed and well-nourished. No distress.  HENT:  Head: Normocephalic and atraumatic.  Right Ear: External ear normal.  Left Ear: External ear normal.  Nose: Nose normal.  Mouth/Throat: Oropharynx is clear and moist.  Eyes: Conjunctivae are normal.  Neck: Normal range of motion. Neck supple.  No nuchal rigidity.   Cardiovascular: Normal rate.   Pulmonary/Chest: Effort normal.  Abdominal: Soft.  Musculoskeletal: Normal range of motion.  Neurological: He is alert and oriented to person, place, and time.  Skin: Skin is warm and dry. He is not diaphoretic.  Psychiatric: He has a normal mood and affect.  Nursing note and vitals reviewed.   ED Course  Procedures (including critical care time) Labs Review Labs Reviewed - No data to display  Imaging Review Dg Foot Complete Right  03/18/2015   CLINICAL DATA:  24 year old male with history of trauma to the  right foot earlier today, when a heavy object fell on the foot. Pain is diffuse, but most severe over the great toe.  EXAM: RIGHT FOOT COMPLETE - 3+ VIEW  COMPARISON:  No priors.  FINDINGS: Minimally displaced fracture of the lateral aspect of the base of the first distal phalanx with overlying soft tissue swelling. No other acute displaced fracture, subluxation or dislocation noted in the foot.  IMPRESSION: 1. Minimally displaced fracture of the lateral aspect of the base of the first distal phalanx.   Electronically Signed   By: Trudie Reedaniel  Entrikin M.D.   On: 03/18/2015 19:21      EKG Interpretation None      MDM   Final diagnoses:  Phalanx fracture, foot, right, closed, initial encounter    Filed Vitals:   03/18/15 1809  BP: 112/63  Pulse: 78  Temp: 99.5 F (37.5 C)  Resp: 18   Patient X-Ray with closed fracture. Neurovascularly intact. Normal sensation. No evidence of compartment syndrome. Pain managed in ED. Pt advised to follow up with PCP and/or ortho for recheck. Patient given post op shoe (has crutches) while in ED, conservative therapy recommended and discussed. Patient will be dc home &  is agreeable with above plan.     Francee PiccoloJennifer Sunset Joshi, PA-C 03/18/15 2112  Rolland PorterMark James, MD 03/27/15 [phone]

## 2015-06-17 ENCOUNTER — Emergency Department (HOSPITAL_BASED_OUTPATIENT_CLINIC_OR_DEPARTMENT_OTHER)
Admission: EM | Admit: 2015-06-17 | Discharge: 2015-06-17 | Disposition: A | Discharge status: Home / Self Care | Payer: 59 | Attending: Emergency Medicine | Admitting: Emergency Medicine

## 2015-06-17 ENCOUNTER — Encounter (HOSPITAL_BASED_OUTPATIENT_CLINIC_OR_DEPARTMENT_OTHER): Payer: Self-pay

## 2015-06-17 DIAGNOSIS — L02213 Cutaneous abscess of chest wall: Secondary | ICD-10-CM | POA: Diagnosis not present

## 2015-06-17 DIAGNOSIS — J45909 Unspecified asthma, uncomplicated: Secondary | ICD-10-CM | POA: Diagnosis not present

## 2015-06-17 MED ORDER — OXYCODONE-ACETAMINOPHEN 5-325 MG PO TABS: 2.0000 | ORAL_TABLET | ORAL | Status: DC | PRN

## 2015-06-17 MED ORDER — OXYCODONE-ACETAMINOPHEN 5-325 MG PO TABS
1.0000 | ORAL_TABLET | Freq: Once | ORAL | Status: AC
  Administered 2015-06-17: 1 via ORAL
  Filled 2015-06-17: qty 1

## 2015-06-17 MED ORDER — LIDOCAINE-EPINEPHRINE (PF) 2 %-1:200000 IJ SOLN
20.0000 mL | Freq: Once | INTRAMUSCULAR | Status: AC
  Administered 2015-06-17: 20 mL
  Filled 2015-06-17: qty 20

## 2015-06-17 MED ORDER — SULFAMETHOXAZOLE-TRIMETHOPRIM 800-160 MG PO TABS: 1.0000 | ORAL_TABLET | Freq: Two times a day (BID) | ORAL | Status: AC

## 2015-06-17 NOTE — ED Provider Notes (Signed)
CSN: [id number]     Arrival date & time 06/17/15  1407 History   First MD Initiated Contact with Patient 06/17/15 1449     Chief Complaint  Patient presents with  . Abscess     (Consider location/radiation/quality/duration/timing/severity/associated sxs/prior Treatment) HPI Comments: Jay Smith is a 24 y.o M with no significant past medical history who presents to the emergency department today complaining of abscess on right chest wall. Patient states that 2 days ago he noticed an ingrown hair and began to manipulate it to pop it. Over the last 2 days the area has become very large and swollen and turned red. Patient states the pain is 9 of 10 and keeping him awake at night. He has been applying hot compresses which provided some relief but patient still has pain. No drainage. Denies fever, chills, vomiting, weakness, numbness, tingling, chest pain, shortness of breath.  Patient is a 24 y.o. male presenting with abscess. The history is provided by the patient.  Abscess   Past Medical History  Diagnosis Date  . Asthma   . Eczema    Past Surgical History  Procedure Laterality Date  . Cosmetic surgery     Family History  Problem Relation Age of Onset  . Diabetes Brother   . Diabetes Maternal Grandmother   . Hypertension Maternal Grandmother   . Diabetes Maternal Grandfather   . Hypertension Maternal Grandfather   . Heart disease Maternal Grandfather   . Diabetes Paternal Grandfather   . Hypertension Paternal Grandfather    Social History  Substance Use Topics  . Smoking status: Never Smoker   . Smokeless tobacco: None  . Alcohol Use: No    Review of Systems  All other systems reviewed and are negative.     Allergies  Hydrocodone  Home Medications   Prior to Admission medications   Medication Sig Start Date End Date Taking? Authorizing Provider  oxyCODONE-acetaminophen (PERCOCET/ROXICET) 5-325 MG per tablet Take 2 tablets by mouth every 4 (four) hours as  needed for severe pain. 06/17/15   Abdalrahman Clementson Tripp Monic Engelmann, PA-C  sulfamethoxazole-trimethoprim (BACTRIM DS,SEPTRA DS) 800-160 MG per tablet Take 1 tablet by mouth 2 (two) times daily. 06/17/15 06/24/15  Kaulana Brindle Tripp Erienne Spelman, PA-C   BP 103/47 mmHg  Pulse 64  Temp(Src) 98.4 F (36.9 C) (Oral)  Resp 16  Ht  (1.702 m)  Wt 141 lb (63.957 kg)  BMI 22.08 kg/m2  SpO2 98% Physical Exam  Constitutional: He is oriented to person, place, and time. He appears well-developed and well-nourished.  HENT:  Head: Normocephalic and atraumatic.  Eyes: Conjunctivae are normal. Pupils are equal, round, and reactive to light. Right eye exhibits no discharge. Left eye exhibits no discharge. No scleral icterus.  Cardiovascular: Normal rate.   Pulmonary/Chest: Effort normal and breath sounds normal. No respiratory distress. He has no wheezes. He has no rales. He exhibits no tenderness.  Abdominal: Soft.  Neurological: He is alert and oriented to person, place, and time. Coordination normal.  Skin: Skin is warm and dry. No rash noted. No erythema. No pallor.  3 cm x 2 cm fluctuant and indurated mass on right chest wall adjacent to the sternum. Mass is erythematous and warm to the touch. No drainage noted.  Psychiatric: He has a normal mood and affect. His behavior is normal.  Nursing note and vitals reviewed.   ED Course  Procedures (including critical care time)  INCISION AND DRAINAGE Performed by: Dub Mikes Consent: Verbal consent obtained. Risks and benefits:  risks, benefits and alternatives were discussed Type: abscess  Body area: right chest wall  Anesthesia: local infiltration  Incision was made with a scalpel.  Local anesthetic: lidocaine 2% with  epinephrine  Anesthetic total: 10 ml  Complexity: complex Blunt dissection to break up loculations  Drainage: purulent  Drainage amount: minimal  Packing material: none  Patient tolerance: Patient tolerated the procedure  well with no immediate complications.    Labs Review Labs Reviewed - No data to display  Imaging Review No results found. I have personally reviewed and evaluated these images and lab results as part of my medical decision-making.   EKG Interpretation None      MDM   Final diagnoses:  Abscess of chest wall    Patient seen for abscess on right chest wall onset 2 days ago. Abscess was drained with minimal purulent discharge. Will place patient on Bactrim. Recommend wound recheck in 3 days. Return precautions outlined in patient discharge instructions.    Jay Kinsman [place], PA-C 06/17/15 2222  Mirian Mo, MD 06/29/15 [phone]

## 2015-06-17 NOTE — Discharge Instructions (Signed)
Abscess An abscess is an infected area that contains a collection of pus and debris.It can occur in almost any part of the body. An abscess is also known as a furuncle or boil. CAUSES  An abscess occurs when tissue gets infected. This can occur from blockage of oil or sweat glands, infection of hair follicles, or a minor injury to the skin. As the body tries to fight the infection, pus collects in the area and creates pressure under the skin. This pressure causes pain. People with weakened immune systems have difficulty fighting infections and get certain abscesses more often.  SYMPTOMS Usually an abscess develops on the skin and becomes a painful mass that is red, warm, and tender. If the abscess forms under the skin, you may feel a moveable soft area under the skin. Some abscesses break open (rupture) on their own, but most will continue to get worse without care. The infection can spread deeper into the body and eventually into the bloodstream, causing you to feel ill.  DIAGNOSIS  Your caregiver will take your medical history and perform a physical exam. A sample of fluid may also be taken from the abscess to determine what is causing your infection. TREATMENT  Your caregiver may prescribe antibiotic medicines to fight the infection. However, taking antibiotics alone usually does not cure an abscess. Your caregiver may need to make a small cut (incision) in the abscess to drain the pus. In some cases, gauze is packed into the abscess to reduce pain and to continue draining the area. HOME CARE INSTRUCTIONS   Only take over-the-counter or prescription medicines for pain, discomfort, or fever as directed by your caregiver.  If you were prescribed antibiotics, take them as directed. Finish them even if you start to feel better.  If gauze is used, follow your caregiver's directions for changing the gauze.  To avoid spreading the infection:  Keep your draining abscess covered with a  bandage.  Wash your hands well.  Do not share personal care items, towels, or whirlpools with others.  Avoid skin contact with others.  Keep your skin and clothes clean around the abscess.  Keep all follow-up appointments as directed by your caregiver. SEEK MEDICAL CARE IF:   You have increased pain, swelling, redness, fluid drainage, or bleeding.  You have muscle aches, chills, or a general ill feeling.  You have a fever. MAKE SURE YOU:   Understand these instructions.  Will watch your condition.  Will get help right away if you are not doing well or get worse. Document Released: 07/06/2005 Document Revised: 03/27/2012 Document Reviewed: 12/09/2011 [facility] Patient Information 2015 [place], [place]. This information is not intended to replace advice given to you by your health care provider. Make sure you discuss any questions you have with your health care provider.  Keep site clean and dry. May wash with mild soap and water. Return in 3 days for wound re-check. Return if fevers, chills, vomiting, or clear signs of skin infection occur.

## 2015-06-17 NOTE — ED Notes (Signed)
C/o "knot on my chest" x 2 days

## 2015-08-17 ENCOUNTER — Encounter (HOSPITAL_BASED_OUTPATIENT_CLINIC_OR_DEPARTMENT_OTHER): Payer: Self-pay | Admitting: *Deleted

## 2015-08-17 ENCOUNTER — Emergency Department (HOSPITAL_BASED_OUTPATIENT_CLINIC_OR_DEPARTMENT_OTHER): Payer: 59

## 2015-08-17 ENCOUNTER — Emergency Department (HOSPITAL_BASED_OUTPATIENT_CLINIC_OR_DEPARTMENT_OTHER)
Admission: EM | Admit: 2015-08-17 | Discharge: 2015-08-17 | Disposition: A | Discharge status: Home / Self Care | Payer: 59 | Attending: Emergency Medicine | Admitting: Emergency Medicine

## 2015-08-17 DIAGNOSIS — R0781 Pleurodynia: Secondary | ICD-10-CM

## 2015-08-17 DIAGNOSIS — R0789 Other chest pain: Secondary | ICD-10-CM | POA: Insufficient documentation

## 2015-08-17 DIAGNOSIS — Z872 Personal history of diseases of the skin and subcutaneous tissue: Secondary | ICD-10-CM | POA: Diagnosis not present

## 2015-08-17 DIAGNOSIS — J45901 Unspecified asthma with (acute) exacerbation: Secondary | ICD-10-CM | POA: Insufficient documentation

## 2015-08-17 DIAGNOSIS — R079 Chest pain, unspecified: Secondary | ICD-10-CM | POA: Diagnosis present

## 2015-08-17 LAB — D-DIMER, QUANTITATIVE (NOT AT ARMC)

## 2015-08-17 LAB — TROPONIN I

## 2015-08-17 MED ORDER — IBUPROFEN 800 MG PO TABS: 800.0000 mg | ORAL_TABLET | Freq: Three times a day (TID) | ORAL | Status: DC | PRN

## 2015-08-17 MED ORDER — OXYCODONE-ACETAMINOPHEN 5-325 MG PO TABS
1.0000 | ORAL_TABLET | Freq: Once | ORAL | Status: AC
  Administered 2015-08-17: 1 via ORAL
  Filled 2015-08-17: qty 1

## 2015-08-17 MED ORDER — IBUPROFEN 800 MG PO TABS
800.0000 mg | ORAL_TABLET | Freq: Once | ORAL | Status: AC
  Administered 2015-08-17: 800 mg via ORAL
  Filled 2015-08-17: qty 1

## 2015-08-17 NOTE — Discharge Instructions (Signed)
You were seen today for chest pain. Your workup is reassuring. Your EKG, chest x-ray, and basic labwork is reassuring. Your pain may be related to inflammation.  See return precautions below.  Nonspecific Chest Pain  Chest pain can be caused by many different conditions. There is always a chance that your pain could be related to something serious, such as a heart attack or a blood clot in your lungs. Chest pain can also be caused by conditions that are not life-threatening. If you have chest pain, it is very important to follow up with your health care provider. CAUSES  Chest pain can be caused by:  Heartburn.  Pneumonia or bronchitis.  Anxiety or stress.  Inflammation around your heart (pericarditis) or lung (pleuritis or pleurisy).  A blood clot in your lung.  A collapsed lung (pneumothorax). It can develop suddenly on its own (spontaneous pneumothorax) or from trauma to the chest.  Shingles infection (varicella-zoster virus).  Heart attack.  Damage to the bones, muscles, and cartilage that make up your chest wall. This can include:  Bruised bones due to injury.  Strained muscles or cartilage due to frequent or repeated coughing or overwork.  Fracture to one or more ribs.  Sore cartilage due to inflammation (costochondritis). RISK FACTORS  Risk factors for chest pain may include:  Activities that increase your risk for trauma or injury to your chest.  Respiratory infections or conditions that cause frequent coughing.  Medical conditions or overeating that can cause heartburn.  Heart disease or family history of heart disease.  Conditions or health behaviors that increase your risk of developing a blood clot.  Having had chicken pox (varicella zoster). SIGNS AND SYMPTOMS Chest pain can feel like:  Burning or tingling on the surface of your chest or deep in your chest.  Crushing, pressure, aching, or squeezing pain.  Dull or sharp pain that is worse when you  move, cough, or take a deep breath.  Pain that is also felt in your back, neck, shoulder, or arm, or pain that spreads to any of these areas. Your chest pain may come and go, or it may stay constant. DIAGNOSIS Lab tests or other studies may be needed to find the cause of your pain. Your health care provider may have you take a test called an ambulatory ECG (electrocardiogram). An ECG records your heartbeat patterns at the time the test is performed. You may also have other tests, such as:  Transthoracic echocardiogram (TTE). During echocardiography, sound waves are used to create a picture of all of the heart structures and to look at how blood flows through your heart.  Transesophageal echocardiogram (TEE).This is a more advanced imaging test that obtains images from inside your body. It allows your health care provider to see your heart in finer detail.  Cardiac monitoring. This allows your health care provider to monitor your heart rate and rhythm in real time.  Holter monitor. This is a portable device that records your heartbeat and can help to diagnose abnormal heartbeats. It allows your health care provider to track your heart activity for several days, if needed.  Stress tests. These can be done through exercise or by taking medicine that makes your heart beat more quickly.  Blood tests.  Imaging tests. TREATMENT  Your treatment depends on what is causing your chest pain. Treatment may include:  Medicines. These may include:  Acid blockers for heartburn.  Anti-inflammatory medicine.  Pain medicine for inflammatory conditions.  Antibiotic medicine, if an infection  is present.  Medicines to dissolve blood clots.  Medicines to treat coronary artery disease.  Supportive care for conditions that do not require medicines. This may include:  Resting.  Applying heat or cold packs to injured areas.  Limiting activities until pain decreases. HOME CARE INSTRUCTIONS  If you  were prescribed an antibiotic medicine, finish it all even if you start to feel better.  Avoid any activities that bring on chest pain.  Do not use any tobacco products, including cigarettes, chewing tobacco, or electronic cigarettes. If you need help quitting, ask your health care provider.  Do not drink alcohol.  Take medicines only as directed by your health care provider.  Keep all follow-up visits as directed by your health care provider. This is important. This includes any further testing if your chest pain does not go away.  If heartburn is the cause for your chest pain, you may be told to keep your head raised (elevated) while sleeping. This reduces the chance that acid will go from your stomach into your esophagus.  Make lifestyle changes as directed by your health care provider. These may include:  Getting regular exercise. Ask your health care provider to suggest some activities that are safe for you.  Eating a heart-healthy diet. A registered dietitian can help you to learn healthy eating options.  Maintaining a healthy weight.  Managing diabetes, if necessary.  Reducing stress. SEEK MEDICAL CARE IF:  Your chest pain does not go away after treatment.  You have a rash with blisters on your chest.  You have a fever. SEEK IMMEDIATE MEDICAL CARE IF:   Your chest pain is worse.  You have an increasing cough, or you cough up blood.  You have severe abdominal pain.  You have severe weakness.  You faint.  You have chills.  You have sudden, unexplained chest discomfort.  You have sudden, unexplained discomfort in your arms, back, neck, or jaw.  You have shortness of breath at any time.  You suddenly start to sweat, or your skin gets clammy.  You feel nauseous or you vomit.  You suddenly feel light-headed or dizzy.  Your heart begins to beat quickly, or it feels like it is skipping beats. These symptoms may represent a serious problem that is an  emergency. Do not wait to see if the symptoms will go away. Get medical help right away. Call your local emergency services (911 in the [place] Do not drive yourself to the hospital.   This information is not intended to replace advice given to you by your health care provider. Make sure you discuss any questions you have with your health care provider.   Document Released: 07/06/2005 Document Revised: 10/17/2014 Document Reviewed: 05/02/2014 Elsevier Interactive Patient Education [employer]

## 2015-08-17 NOTE — ED Notes (Signed)
SOB and pain in the center of his chest since last night. Sudden onset. No better this after he started moving around.

## 2015-08-17 NOTE — ED Provider Notes (Signed)
CSN: [id number]     Arrival date & time 08/17/15  1519 History   By signing my name below, I, Murriel Hopper, attest that this documentation has been prepared under the direction and in the presence of Shon Baton, MD. Electronically Signed: Murriel Hopper, ED Scribe. 08/17/2015. 3:44 PM.    Chief Complaint  Patient presents with  . Shortness of Breath     Patient is a 24 y.o. male presenting with shortness of breath. The history is provided by the patient. No language interpreter was used.  Shortness of Breath Associated symptoms: chest pain   Associated symptoms: no abdominal pain, no cough and no fever    HPI Comments: AQUAN KOPE is a 24 y.o. male who presents to the Emergency Department complaining of constant, worsening, 8/10 central chest pain with associated SOB that has been present since last night. Pt reports he was at his home peeling shrimp when he first noticed it. Pt states he laid down for a while after his pain began and for 30-45 seconds his pain went away, but reports it has been constant since then. Pt states his SOB and chest pain increases with deep breathing. Pt states his asthma is under control, and reports he hasn't had an asthma attack in 10 years. Pt denies cough or fever. Pt denies any heavy lifting recently. Pt denies taking any medications daily. Pt states he is allergic to hydrocodone.   Past Medical History  Diagnosis Date  . Asthma   . Eczema    Past Surgical History  Procedure Laterality Date  . Cosmetic surgery     Family History  Problem Relation Age of Onset  . Diabetes Brother   . Diabetes Maternal Grandmother   . Hypertension Maternal Grandmother   . Diabetes Maternal Grandfather   . Hypertension Maternal Grandfather   . Heart disease Maternal Grandfather   . Diabetes Paternal Grandfather   . Hypertension Paternal Grandfather    Social History  Substance Use Topics  . Smoking status: Never Smoker   . Smokeless tobacco: None   . Alcohol Use: No    Review of Systems  Constitutional: Negative for fever.  Respiratory: Positive for shortness of breath. Negative for cough.   Cardiovascular: Positive for chest pain. Negative for leg swelling.  Gastrointestinal: Negative for abdominal pain.  All other systems reviewed and are negative.     Allergies  Hydrocodone  Home Medications   Prior to Admission medications   Medication Sig Start Date End Date Taking? Authorizing Provider  ibuprofen (ADVIL,MOTRIN) 800 MG tablet Take 1 tablet (800 mg total) by mouth every 8 (eight) hours as needed. 08/17/15   Shon Baton, MD  oxyCODONE-acetaminophen (PERCOCET/ROXICET) 5-325 MG per tablet Take 2 tablets by mouth every 4 (four) hours as needed for severe pain. 06/17/15   Samantha Tripp Dowless, PA-C   BP 128/76 mmHg  Pulse 67  Temp(Src) 98 F (36.7 C) (Oral)  Resp 18  Ht  (1.702 m)  Wt 141 lb (63.957 kg)  BMI 22.08 kg/m2  SpO2 100% Physical Exam  Constitutional: He is oriented to person, place, and time. He appears well-developed and well-nourished.  HENT:  Head: Normocephalic and atraumatic.  Cardiovascular: Normal rate, regular rhythm and normal heart sounds.   No murmur heard. Pulmonary/Chest: Effort normal and breath sounds normal. No respiratory distress. He has no wheezes. He exhibits no tenderness.  Abdominal: Soft. Bowel sounds are normal. There is no tenderness. There is no rebound.  Musculoskeletal: He  exhibits no edema.  Neurological: He is alert and oriented to person, place, and time.  Skin: Skin is warm and dry.  Psychiatric: He has a normal mood and affect.  Nursing note and vitals reviewed.   ED Course  Procedures (including critical care time)  DIAGNOSTIC STUDIES: Oxygen Saturation is 100% on room air, normal by my interpretation.    COORDINATION OF CARE: 3:43 PM Discussed treatment plan with pt at bedside and pt agreed to plan.   Labs Review Labs Reviewed  D-DIMER,  QUANTITATIVE ([facility])  TROPONIN I    Imaging Review Dg Chest 2 View  08/17/2015  CLINICAL DATA:  Central chest pain since last night. Shortness of breath. History of asthma EXAM: CHEST  2 VIEW COMPARISON:  12/31/2009 FINDINGS: The heart size and mediastinal contours are within normal limits. Both lungs are clear. The visualized skeletal structures are unremarkable. IMPRESSION: No active cardiopulmonary disease. Electronically Signed   By: Charlett NoseKevin  Dover M.D.   On: 08/17/2015 15:55   I have personally reviewed and evaluated these images and lab results as part of my medical decision-making.   EKG Interpretation   Date/Time:  Monday August 17 2015 16:28:15 EST Ventricular Rate:  71 PR Interval:  138 QRS Duration: 80 QT Interval:  362 QTC Calculation: 393 R Axis:   71 Text Interpretation:  Normal sinus rhythm Normal ECG Confirmed by Marinus Eicher   MD, Ceanna Wareing ([id number]) on 08/17/2015 3:30:21 PM      MDM   Final diagnoses:  Chest pain, pleuritic   Patient presents with chest pain or shortness of breath. Nontoxic on exam. Afebrile and vital signs are reassuring.  Chest pain is not reproducible. Patient has no evidence of wheezing or abnormal pulmonary exam. EKG is reassuring. Chest x-ray shows no evidence of pneumothorax or pneumonia. Low suspicion for bronchitis given absence of cough or abnormal pulmonary exam. Screening d-dimer sent and is negative. Troponin is negative. Patient given ibuprofen. On recheck, he continues to report persistent pain. Discussed the workup and results with the patient. Suspect costochondritis. Given that he is young and otherwise healthy, do not feel he needs further workup at this time. Patient was given one dose of oxycodone. Will be discharged with 800 mg of ibuprofen 3 times a day. He was given strict return precautions.  After history, exam, and medical workup I feel the patient has been appropriately medically screened and is safe for discharge home.  Pertinent diagnoses were discussed with the patient. Patient was given return precautions.  I personally performed the services described in this documentation, which was scribed in my presence. The recorded information has been reviewed and is accurate.   Shon Batonourtney F Saamiya Jeppsen, MD 08/17/15 [phone]

## 2015-11-01 ENCOUNTER — Encounter (HOSPITAL_BASED_OUTPATIENT_CLINIC_OR_DEPARTMENT_OTHER): Payer: Self-pay | Admitting: *Deleted

## 2015-11-01 ENCOUNTER — Emergency Department (HOSPITAL_BASED_OUTPATIENT_CLINIC_OR_DEPARTMENT_OTHER)
Admission: EM | Admit: 2015-11-01 | Discharge: 2015-11-01 | Disposition: A | Discharge status: Home / Self Care | Payer: 59 | Attending: Emergency Medicine | Admitting: Emergency Medicine

## 2015-11-01 ENCOUNTER — Emergency Department (HOSPITAL_BASED_OUTPATIENT_CLINIC_OR_DEPARTMENT_OTHER): Payer: 59

## 2015-11-01 DIAGNOSIS — M25512 Pain in left shoulder: Secondary | ICD-10-CM | POA: Diagnosis present

## 2015-11-01 DIAGNOSIS — J45909 Unspecified asthma, uncomplicated: Secondary | ICD-10-CM | POA: Insufficient documentation

## 2015-11-01 DIAGNOSIS — M79602 Pain in left arm: Secondary | ICD-10-CM | POA: Diagnosis not present

## 2015-11-01 DIAGNOSIS — Z872 Personal history of diseases of the skin and subcutaneous tissue: Secondary | ICD-10-CM | POA: Insufficient documentation

## 2015-11-01 MED ORDER — KETOROLAC TROMETHAMINE 60 MG/2ML IM SOLN
60.0000 mg | Freq: Once | INTRAMUSCULAR | Status: AC
  Administered 2015-11-01: 60 mg via INTRAMUSCULAR
  Filled 2015-11-01: qty 2

## 2015-11-01 MED ORDER — NAPROXEN 500 MG PO TABS: 500.0000 mg | ORAL_TABLET | Freq: Two times a day (BID) | ORAL | Status: DC

## 2015-11-01 NOTE — ED Notes (Signed)
Reports left arm pain x 3 days. Denies injury

## 2015-11-01 NOTE — Discharge Instructions (Signed)

## 2015-11-01 NOTE — ED Provider Notes (Signed)
CSN: [id number]     Arrival date & time 11/01/15  5284 History   First MD Initiated Contact with Patient 11/01/15 1010     Chief Complaint  Patient presents with  . Arm Pain     (Consider location/radiation/quality/duration/timing/severity/associated sxs/prior Treatment) HPI Comments: 3 days ago started having pain in left shoulder with radiation to lower arm Initially started in thumb, then radiated to shoulder Burning pain Started spontaneously 8/10 Nothing makes it better or worse Comes and goes Nonexertional No ABD pain/CP/SOB No fevers, chills, cough   Patient is a 25 y.o. male presenting with arm pain.  Arm Pain Pertinent negatives include no chest pain, no abdominal pain, no headaches and no shortness of breath.    Past Medical History  Diagnosis Date  . Asthma   . Eczema    Past Surgical History  Procedure Laterality Date  . Cosmetic surgery     Family History  Problem Relation Age of Onset  . Diabetes Brother   . Diabetes Maternal Grandmother   . Hypertension Maternal Grandmother   . Diabetes Maternal Grandfather   . Hypertension Maternal Grandfather   . Heart disease Maternal Grandfather   . Diabetes Paternal Grandfather   . Hypertension Paternal Grandfather    Social History  Substance Use Topics  . Smoking status: Never Smoker   . Smokeless tobacco: Never Used  . Alcohol Use: 0.0 oz/week    0 Standard drinks or equivalent per week     Comment: rare    Review of Systems  Constitutional: Negative for fever.  HENT: Negative for sore throat.   Eyes: Negative for visual disturbance.  Respiratory: Negative for shortness of breath.   Cardiovascular: Negative for chest pain.  Gastrointestinal: Negative for abdominal pain.  Genitourinary: Negative for difficulty urinating.  Musculoskeletal: Positive for myalgias. Negative for back pain, neck pain and neck stiffness.  Skin: Negative for rash.  Neurological: Negative for syncope, speech difficulty,  weakness, numbness and headaches.      Allergies  Hydrocodone  Home Medications   Prior to Admission medications   Medication Sig Start Date End Date Taking? Authorizing Provider  ibuprofen (ADVIL,MOTRIN) 800 MG tablet Take 1 tablet (800 mg total) by mouth every 8 (eight) hours as needed. 08/17/15   Shon Baton, MD  naproxen (NAPROSYN) 500 MG tablet Take 1 tablet (500 mg total) by mouth 2 (two) times daily with a meal. 11/01/15   Alvira Monday, MD  oxyCODONE-acetaminophen (PERCOCET/ROXICET) 5-325 MG per tablet Take 2 tablets by mouth every 4 (four) hours as needed for severe pain. 06/17/15   Samantha Tripp Dowless, PA-C   BP 122/79 mmHg  Pulse 81  Temp(Src) 98.3 F (36.8 C) (Oral)  Resp 16  Ht  (1.702 m)  Wt 141 lb (63.957 kg)  BMI 22.08 kg/m2 Physical Exam  Constitutional: He is oriented to person, place, and time. He appears well-developed and well-nourished. No distress.  HENT:  Head: Normocephalic and atraumatic.  Eyes: Conjunctivae and EOM are normal.  Neck: Normal range of motion.  Cardiovascular: Normal rate, regular rhythm, normal heart sounds and intact distal pulses.  Exam reveals no gallop and no friction rub.   No murmur heard. Pulmonary/Chest: Effort normal and breath sounds normal. No respiratory distress. He has no wheezes. He has no rales.  Abdominal: Soft. He exhibits no distension. There is no tenderness. There is no guarding.  Musculoskeletal: He exhibits no edema.  Full ROM and strength of left upper extremity, no swelling, no tenderness  Neurological: He is alert and oriented to person, place, and time. No cranial nerve deficit.  Skin: Skin is warm and dry. He is not diaphoretic.  Nursing note and vitals reviewed.   ED Course  Procedures (including critical care time) Labs Review Labs Reviewed - No data to display  Imaging Review Dg Chest 2 View  11/01/2015  CLINICAL DATA:  25 year old male with left upper extremity pain for 3 days  radiating to the shoulder. Initial encounter. EXAM: CHEST  2 VIEW COMPARISON:  08/17/2015 and earlier. FINDINGS: Lower lung volumes today. Mediastinal contours remain normal. Visualized tracheal air column is within normal limits. The lungs remain clear. No pneumothorax or pleural effusion. Bone mineralization is within normal limits. No osseous abnormality identified. IMPRESSION: Lower lung volumes, otherwise negative. No acute cardiopulmonary abnormality. Electronically Signed   By: Odessa Fleming M.D.   On: 11/01/2015 10:54   I have personally reviewed and evaluated these images and lab results as part of my medical decision-making.   EKG Interpretation   Date/Time:  Sunday November 01 2015 10:32:22 EST Ventricular Rate:  68 PR Interval:  134 QRS Duration: 90 QT Interval:  386 QTC Calculation: 410 R Axis:   76 Text Interpretation:  Normal sinus rhythm with sinus arrhythmia Normal ECG  Reconfirmed by [facility] MD, Novalee Horsfall ([id number]) on 11/01/2015 11:02:46 AM      MDM   Final diagnoses:  Left arm pain    25 year old male with a history of asthma and eczema presents with concern of burning left arm pain which began 3 days ago with pain radiating from shoulder to the thumb.  Screening EKG done which was invited by me and which showed no signs of acute ischemia. Chest x-ray obtained to screen for any abnormalities was within normal limits.   have low suspicion for cardiac etiology of pain given no chest pain, no shortness of breath, no nausea, no diaphoresis, young age with no cardiac risk factors.  pain by history, sounds radicular in likely neuropathic related to peripheral nerve compression, or potential cervical radiculopathy.  Patient has full strength, no red flags of cord compression, no trauma or neck pain and do not feel emergent imaging is indicated.   No other neurologic symptoms and doubt other intracranial abnormality.  Discussed need for close outpatient follow up with patient. Patient  discharged in stable condition with understanding of reasons to return.    Alvira Monday, MD 11/01/15 [phone]

## 2015-11-03 ENCOUNTER — Ambulatory Visit: Payer: 59 | Admitting: Family Medicine

## 2015-11-06 ENCOUNTER — Ambulatory Visit: Payer: 59 | Admitting: Family Medicine

## 2015-12-30 ENCOUNTER — Ambulatory Visit: Payer: 59 | Admitting: Family

## 2016-01-22 ENCOUNTER — Ambulatory Visit (INDEPENDENT_AMBULATORY_CARE_PROVIDER_SITE_OTHER): Payer: Self-pay | Admitting: Physician Assistant

## 2016-01-22 VITALS — BP 102/68 | HR 82 | Temp 98.0°F | Resp 16 | Ht 67.0 in | Wt 133.0 lb

## 2016-01-22 DIAGNOSIS — Z0289 Encounter for other administrative examinations: Secondary | ICD-10-CM

## 2016-01-22 DIAGNOSIS — Z021 Encounter for pre-employment examination: Secondary | ICD-10-CM

## 2016-01-22 NOTE — Progress Notes (Signed)
Commercial Driver Medical Examination   Jay Smith is a 25 y.o. male who presents today for a commercial driver fitness determination physical exam. The patient reports no problems. The following portions of the patient's history were reviewed and updated as appropriate: allergies, current medications, past family history, past medical history, past social history, past surgical history and problem list. Review of Systems Pertinent items are noted in HPI.   Objective:    Vision/hearing:  Visual Acuity Screening   Right eye Left eye Both eyes  Without correction:     With correction: 20/30 20/25 20/20   Comments: Peripheral Vision: Right eye 85 degrees. Left eye 85 degrees.  The patient can distinguish the colors red, amber and green.   Hearing Screening Comments: The patient was able to hear a forced whisper from 10 feet.    Applicant can recognize and distinguish among traffic control signals and devices showing standard red, green, and amber colors.  Corrective lenses required: Yes  Monocular Vision?: No  Hearing aid requirement: No  Physical Exam  Constitutional: He is oriented to person, place, and time. He appears well-developed. He does not appear ill.  Eyes: Conjunctivae and EOM are normal. Pupils are equal, round, and reactive to light.  Cardiovascular: Normal rate.   Pulmonary/Chest: Effort normal.  Abdominal: He exhibits no distension.  Musculoskeletal: Normal range of motion.  Neurological: He is alert and oriented to person, place, and time. He has normal strength and normal reflexes. No cranial nerve deficit or sensory deficit. Coordination and gait normal.  Skin: Skin is warm and dry. He is not diaphoretic.  Psychiatric: He has a normal mood and affect. His speech is normal and behavior is normal. Judgment and thought content normal. Cognition and memory are normal.  Nursing note and vitals reviewed.   BP 102/68 mmHg  Pulse 82  Temp(Src) 98 F  (36.7 C) (Oral)  Resp 16  Ht 5\' 7"  (1.702 m)  Wt 133 lb (60.328 kg)  BMI 20.83 kg/m2  SpO2 98%  Labs: Comments:  (URINE SG 1.015 PRO neg BLO neg GLU neg)  Assessment:    Healthy male exam.  Meets standards in 5949 CFR 391.41;  qualifies for 2 year certificate.    Plan:    Medical examiners certificate completed and printed. Return as needed.

## 2016-08-26 ENCOUNTER — Telehealth: Payer: Self-pay

## 2016-08-26 NOTE — Telephone Encounter (Signed)
Patient is calling in regards of DOT records.  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  I did see that it had already been scanned in to his file.  If he could get a copy of his packet and card that would be great.  I did tell him that he needs to come by and fill out a release form.  He said he should be able to come by today to fill it out.    Just a heads up, thank you!  [phone]

## 2016-09-05 ENCOUNTER — Ambulatory Visit (INDEPENDENT_AMBULATORY_CARE_PROVIDER_SITE_OTHER): Payer: Self-pay | Admitting: Family Medicine

## 2016-09-05 ENCOUNTER — Encounter: Payer: Self-pay | Admitting: Family Medicine

## 2016-09-05 DIAGNOSIS — M545 Low back pain, unspecified: Secondary | ICD-10-CM

## 2016-09-05 MED ORDER — OXYCODONE-ACETAMINOPHEN 5-325 MG PO TABS: 1.0000 | ORAL_TABLET | Freq: Four times a day (QID) | ORAL | 0 refills | Status: DC | PRN

## 2016-09-05 NOTE — Patient Instructions (Signed)
You have a severe lumbar strain. Aleve 2 tabs twice a day with food OR ibuprofen 600mg  three times a day with food for pain and inflammation (if you do not have stomach or kidney issues). Percocet as needed for severe pain (no driving on this medicine). Consider prednisone dose pack, prescription anti-inflammatory, muscle relaxant as well. Stay as active as possible. Do home exercises and stretches as directed - hold each for 20-30 seconds and do each one three times. Consider massage, chiropractor, physical therapy, and/or acupuncture. Physical therapy has been shown to be helpful while the others have mixed results. Strengthening of low back muscles, abdominal musculature are key for long term pain relief. Call me in a week to let me know how you're doing.

## 2016-09-06 ENCOUNTER — Ambulatory Visit: Payer: Self-pay | Admitting: Family Medicine

## 2016-09-06 DIAGNOSIS — M545 Low back pain, unspecified: Secondary | ICD-10-CM | POA: Insufficient documentation

## 2016-09-06 NOTE — Progress Notes (Signed)
PCP: No PCP Per Patient  Subjective:   HPI: Patient is a 25 y.o. male here for low back pain.  Patient reports he was at work on 11/24 moving furniture. Recalls moving a couch - had pain lateral that day but by the next day pain was very severe. Pain currently 10/10, sharp low back. No radiation into legs. No numbness or tingling. No bowel/bladder dysfunction. No prior issues with low back. Was taking ibuprofen.  Past Medical History:  Diagnosis Date  . Asthma   . Eczema     No current outpatient prescriptions on file prior to visit.   No current facility-administered medications on file prior to visit.     Past Surgical History:  Procedure Laterality Date  . COSMETIC SURGERY      Allergies  Allergen Reactions  . Hydrocodone Swelling    Social History   Social History  . Marital status: Single    Spouse name: N/A  . Number of children: N/A  . Years of education: N/A   Occupational History  . Not on file.   Social History Main Topics  . Smoking status: Never Smoker  . Smokeless tobacco: Never Used  . Alcohol use 0.0 oz/week     Comment: rare  . Drug use: No  . Sexual activity: Not on file   Other Topics Concern  . Not on file   Social History Narrative  . No narrative on file    Family History  Problem Relation Age of Onset  . Diabetes Brother   . Diabetes Maternal Grandmother   . Hypertension Maternal Grandmother   . Diabetes Maternal Grandfather   . Hypertension Maternal Grandfather   . Heart disease Maternal Grandfather   . Diabetes Paternal Grandfather   . Hypertension Paternal Grandfather     BP 110/68   Pulse 80   Ht 5\' 7"  (1.702 m)   Wt 135 lb (61.2 kg)   BMI 21.14 kg/m   Review of Systems: See HPI above.     Objective:  Physical Exam:  Gen: NAD, comfortable in exam room  Back: No gross deformity, scoliosis. TTP bilateral lumbar paraspinal region.  No midline or bony TTP. Very limited motion due to pain. Strength LEs  5/5 all muscle groups.   2+ MSRs in patellar and achilles tendons, equal bilaterally. Negative SLRs. Sensation intact to light touch bilaterally. Negative logroll bilateral hips   Assessment & Plan:  1. Low back pain - consistent with severe lumbar strain.  Start with aleve or ibuprofen, percocet as needed for severe pain.  Consider prednisone dose pack, physical therapy.  Call us in a week to let us know how he's doing.  Shown home exercises and stretches to do daily.

## 2016-09-06 NOTE — Assessment & Plan Note (Signed)
consistent with severe lumbar strain.  Start with aleve or ibuprofen, percocet as needed for severe pain.  Consider prednisone dose pack, physical therapy.  Call us in a week to let us know how he's doing.  Shown home exercises and stretches to do daily.

## 2017-03-28 ENCOUNTER — Emergency Department (HOSPITAL_BASED_OUTPATIENT_CLINIC_OR_DEPARTMENT_OTHER)
Admission: EM | Admit: 2017-03-28 | Discharge: 2017-03-28 | Disposition: A | Discharge status: Home / Self Care | Payer: Self-pay | Attending: Emergency Medicine | Admitting: Emergency Medicine

## 2017-03-28 ENCOUNTER — Encounter (HOSPITAL_BASED_OUTPATIENT_CLINIC_OR_DEPARTMENT_OTHER): Payer: Self-pay | Admitting: *Deleted

## 2017-03-28 DIAGNOSIS — J020 Streptococcal pharyngitis: Secondary | ICD-10-CM | POA: Insufficient documentation

## 2017-03-28 DIAGNOSIS — J45909 Unspecified asthma, uncomplicated: Secondary | ICD-10-CM | POA: Insufficient documentation

## 2017-03-28 LAB — RAPID STREP SCREEN (MED CTR MEBANE ONLY): Streptococcus, Group A Screen (Direct): POSITIVE — AB

## 2017-03-28 MED ORDER — MAGIC MOUTHWASH W/LIDOCAINE: 5.0000 mL | Freq: Three times a day (TID) | ORAL | 0 refills | Status: DC | PRN

## 2017-03-28 MED ORDER — PENICILLIN G BENZATHINE 1200000 UNIT/2ML IM SUSP
1.2000 10*6.[IU] | Freq: Once | INTRAMUSCULAR | Status: AC
  Administered 2017-03-28: 1.2 10*6.[IU] via INTRAMUSCULAR
  Filled 2017-03-28: qty 2

## 2017-03-28 MED ORDER — ACETAMINOPHEN 325 MG PO TABS
650.0000 mg | ORAL_TABLET | Freq: Once | ORAL | Status: AC
  Administered 2017-03-28: 650 mg via ORAL
  Filled 2017-03-28: qty 2

## 2017-03-28 MED ORDER — IBUPROFEN 600 MG PO TABS: 600.0000 mg | ORAL_TABLET | Freq: Four times a day (QID) | ORAL | 0 refills | Status: DC | PRN

## 2017-03-28 NOTE — ED Provider Notes (Signed)
[facility]   CSN: [id number] Arrival date & time: 03/28/17  1125     History   Chief Complaint Chief Complaint  Patient presents with  . Sore Throat    HPI Jay Smith is a 26 y.o. male.  HPI   Jay ProwsChristopher H Neeson is a 26 y.o. male, with a history of asthma, presenting to the ED with sore throat beginning last night. Patient states it is painful to swallow. Pain is severe, sharp, equal bilaterally, nonradiating. He has been taking NyQuil without relief. Also endorses fever. Denies N/V/D, CP, SOB, swelling, cough, abdominal pain, neck stiffness, rash, or any other complaints.   Past Medical History:  Diagnosis Date  . Asthma   . Eczema     Patient Active Problem List   Diagnosis Date Noted  . Low back pain 09/06/2016    Past Surgical History:  Procedure Laterality Date  . COSMETIC SURGERY         Home Medications    Prior to Admission medications   Medication Sig Start Date End Date Taking? Authorizing Provider  ibuprofen (ADVIL,MOTRIN) 600 MG tablet Take 1 tablet (600 mg total) by mouth every 6 (six) hours as needed. 03/28/17   Joy, Shawn C, PA-C  magic mouthwash w/lidocaine SOLN Take 5 mLs by mouth 3 (three) times daily as needed for mouth pain. 03/28/17   Joy, Hillard DankerShawn C, PA-C    Family History Family History  Problem Relation Age of Onset  . Diabetes Brother   . Diabetes Maternal Grandmother   . Hypertension Maternal Grandmother   . Diabetes Maternal Grandfather   . Hypertension Maternal Grandfather   . Heart disease Maternal Grandfather   . Diabetes Paternal Grandfather   . Hypertension Paternal Grandfather     Social History Social History  Substance Use Topics  . Smoking status: Never Smoker  . Smokeless tobacco: Never Used  . Alcohol use 0.0 oz/week     Comment: rare     Allergies   Hydrocodone   Review of Systems Review of Systems  Constitutional: Positive for fever.  HENT: Positive for sore  throat. Negative for ear pain, facial swelling, trouble swallowing and voice change.   Respiratory: Negative for cough and shortness of breath.   Cardiovascular: Negative for chest pain.  Gastrointestinal: Negative for abdominal pain, diarrhea, nausea and vomiting.  Musculoskeletal: Negative for neck pain and neck stiffness.  Skin: Negative for rash.  All other systems reviewed and are negative.    Physical Exam Updated Vital Signs BP 114/74 (BP Location: Left Arm)   Pulse 95   Temp 99.9 F (37.7 C) (Oral)   Resp 20   Ht 5\' 7"  (1.702 m)   Wt 63.5 kg (140 lb)   SpO2 100%   BMI 21.93 kg/m   Physical Exam  Constitutional: He appears well-developed and well-nourished. No distress.  HENT:  Head: Normocephalic and atraumatic.  Mouth/Throat: Uvula is midline and mucous membranes are normal. No trismus in the jaw. Posterior oropharyngeal edema and posterior oropharyngeal erythema present.  Patient handles oral secretions without difficulty. No swelling into soft tissue of the neck. No signs of RPA/PTA.  Eyes: Conjunctivae are normal.  Neck: Normal range of motion. Neck supple.  Cardiovascular: Normal rate, regular rhythm, normal heart sounds and intact distal pulses.   Pulmonary/Chest: Effort normal and breath sounds normal. No respiratory distress.  No increased work of breathing noted.   Abdominal: Soft. There is no tenderness. There is no guarding.  Musculoskeletal:  He exhibits no edema.  Lymphadenopathy:    He has cervical adenopathy.  Neurological: He is alert.  Skin: Skin is warm and dry. He is not diaphoretic.  Psychiatric: He has a normal mood and affect. His behavior is normal.  Nursing note and vitals reviewed.    ED Treatments / Results  Labs (all labs ordered are listed, but only abnormal results are displayed) Labs Reviewed  RAPID STREP SCREEN (NOT AT [facility]) - Abnormal; Notable for the following:       Result Value   Streptococcus, Group A Screen (Direct)  POSITIVE (*)    All other components within normal limits    EKG  EKG Interpretation None       Radiology No results found.  Procedures Procedures (including critical care time)  Medications Ordered in ED Medications  penicillin g benzathine (BICILLIN LA) 1200000 UNIT/2ML injection 1.2 Million Units (1.2 Million Units Intramuscular Given 03/28/17 1304)  acetaminophen (TYLENOL) tablet 650 mg (650 mg Oral Given 03/28/17 1334)     Initial Impression / Assessment and Plan / ED Course  I have reviewed the triage vital signs and the nursing notes.  Pertinent labs & imaging results that were available during my care of the patient were reviewed by me and considered in my medical decision making (see chart for details).     Patient presents with sore throat. Positive rapid strep. Treated with PCN in ED. Symptom control and importance of hydration discussed with patient and friend at bedside. They voiced disappointment that I wasn't "doing more." I explained the expected course following discharge and the role of NSAIDS, PCN, and hydration.  Patient noted to have a fever prior to discharge. He states he will manage this at home and is comfortable doing so. The patient was given further instructions for home care as well as return precautions. Patient voices understanding of these instructions, accepts the plan, and is comfortable with discharge.   Final Clinical Impressions(s) / ED Diagnoses   Final diagnoses:  Strep throat    New Prescriptions Discharge Medication List as of 03/28/2017 12:41 PM    START taking these medications   Details  ibuprofen (ADVIL,MOTRIN) 600 MG tablet Take 1 tablet (600 mg total) by mouth every 6 (six) hours as needed., Starting Tue 03/28/2017, Print    magic mouthwash w/lidocaine SOLN Take 5 mLs by mouth 3 (three) times daily as needed for mouth pain., Starting Tue 03/28/2017, Print         Joy, Shawn C, PA-C 03/29/17 1138    Melene Plan,  DO 03/29/17 1346

## 2017-03-28 NOTE — ED Triage Notes (Signed)
Sore throat x 2 days

## 2017-03-28 NOTE — Discharge Instructions (Signed)
You tested positive for strep throat. This was treated with penicillin in the ED. You should not need further treatment. The rest of your care is symptomatic.  Gargle with the Magic Mouthwash as needed. Spit it out. Do not swallow it.   Hand washing: Wash your hands throughout the day, but especially before and after touching the face, using the restroom, sneezing, coughing, or touching surfaces that have been coughed or sneezed upon. Hydration: Symptoms will be intensified and complicated by dehydration. Dehydration can also extend the duration of symptoms. Drink plenty of fluids and get plenty of rest. You should be drinking at least half a liter of water an hour to stay hydrated. Electrolyte drinks are also encouraged. You should be drinking enough fluids to make your urine light yellow, almost clear. If this is not the case, you are not drinking enough water. Please note that some of the treatments indicated below will not be effective if you are not adequately hydrated. Pain or fever: Ibuprofen, Naproxen, or Tylenol for pain or fever.  Sore throat: Warm liquids or Chloraseptic spray may help soothe a sore throat. Gargle twice a day with a salt water solution made from a half teaspoon of salt in a cup of warm water.  Follow up: Follow up with a primary care provider, as needed, for any future management of this issue.

## 2017-05-01 ENCOUNTER — Ambulatory Visit: Payer: Self-pay | Admitting: Family Medicine

## 2017-05-22 ENCOUNTER — Encounter (HOSPITAL_COMMUNITY): Payer: Self-pay | Admitting: Emergency Medicine

## 2017-05-22 ENCOUNTER — Emergency Department (HOSPITAL_BASED_OUTPATIENT_CLINIC_OR_DEPARTMENT_OTHER)
Admission: EM | Admit: 2017-05-22 | Discharge: 2017-05-22 | Disposition: A | Discharge status: Home / Self Care | Payer: Self-pay | Attending: Emergency Medicine | Admitting: Emergency Medicine

## 2017-05-22 ENCOUNTER — Emergency Department (HOSPITAL_COMMUNITY)
Admission: EM | Admit: 2017-05-22 | Discharge: 2017-05-22 | Discharge status: Against Medical Advice | Payer: Self-pay | Attending: Emergency Medicine | Admitting: Emergency Medicine

## 2017-05-22 ENCOUNTER — Encounter (HOSPITAL_BASED_OUTPATIENT_CLINIC_OR_DEPARTMENT_OTHER): Payer: Self-pay | Admitting: *Deleted

## 2017-05-22 DIAGNOSIS — Z5321 Procedure and treatment not carried out due to patient leaving prior to being seen by health care provider: Secondary | ICD-10-CM | POA: Insufficient documentation

## 2017-05-22 DIAGNOSIS — R51 Headache: Secondary | ICD-10-CM | POA: Insufficient documentation

## 2017-05-22 DIAGNOSIS — J45909 Unspecified asthma, uncomplicated: Secondary | ICD-10-CM | POA: Insufficient documentation

## 2017-05-22 DIAGNOSIS — E860 Dehydration: Secondary | ICD-10-CM | POA: Insufficient documentation

## 2017-05-22 DIAGNOSIS — R519 Headache, unspecified: Secondary | ICD-10-CM

## 2017-05-22 LAB — URINALYSIS, ROUTINE W REFLEX MICROSCOPIC
Bilirubin Urine: NEGATIVE
Glucose, UA: NEGATIVE mg/dL
Hgb urine dipstick: NEGATIVE
LEUKOCYTES UA: NEGATIVE
NITRITE: NEGATIVE
PH: 6 (ref 5.0–8.0)
PROTEIN: 30 mg/dL — AB
Specific Gravity, Urine: 1.041 — ABNORMAL HIGH (ref 1.005–1.030)

## 2017-05-22 LAB — CBC WITH DIFFERENTIAL/PLATELET
BASOS ABS: 0 10*3/uL (ref 0.0–0.1)
BASOS PCT: 0 %
Eosinophils Absolute: 0 10*3/uL (ref 0.0–0.7)
Eosinophils Relative: 0 %
HCT: 38.7 % — ABNORMAL LOW (ref 39.0–52.0)
HEMOGLOBIN: 13.2 g/dL (ref 13.0–17.0)
LYMPHS ABS: 0.8 10*3/uL (ref 0.7–4.0)
Lymphocytes Relative: 18 %
MCH: 23.7 pg — AB (ref 26.0–34.0)
MCHC: 34.1 g/dL (ref 30.0–36.0)
MCV: 69.4 fL — AB (ref 78.0–100.0)
MONOS PCT: 8 %
Monocytes Absolute: 0.3 10*3/uL (ref 0.1–1.0)
NEUTROS ABS: 3.1 10*3/uL (ref 1.7–7.7)
Neutrophils Relative %: 74 %
Platelets: 177 10*3/uL (ref 150–400)
RBC: 5.58 MIL/uL (ref 4.22–5.81)
RDW: 15.9 % — ABNORMAL HIGH (ref 11.5–15.5)
WBC: 4.2 10*3/uL (ref 4.0–10.5)

## 2017-05-22 LAB — URINALYSIS, MICROSCOPIC (REFLEX): RBC / HPF: NONE SEEN RBC/hpf (ref 0–5)

## 2017-05-22 LAB — RAPID STREP SCREEN (MED CTR MEBANE ONLY): Streptococcus, Group A Screen (Direct): NEGATIVE

## 2017-05-22 LAB — RAPID URINE DRUG SCREEN, HOSP PERFORMED
Amphetamines: NOT DETECTED
BENZODIAZEPINES: NOT DETECTED
Barbiturates: NOT DETECTED
Cocaine: NOT DETECTED
OPIATES: NOT DETECTED
Tetrahydrocannabinol: NOT DETECTED

## 2017-05-22 LAB — RAPID HIV SCREEN (HIV 1/2 AB+AG)
HIV 1/2 Antibodies: NONREACTIVE
HIV-1 P24 Antigen - HIV24: NONREACTIVE

## 2017-05-22 LAB — COMPREHENSIVE METABOLIC PANEL
ALBUMIN: 3.8 g/dL (ref 3.5–5.0)
ALK PHOS: 56 U/L (ref 38–126)
ALT: 25 U/L (ref 17–63)
AST: 24 U/L (ref 15–41)
Anion gap: 10 (ref 5–15)
BILIRUBIN TOTAL: 0.5 mg/dL (ref 0.3–1.2)
BUN: 15 mg/dL (ref 6–20)
CO2: 22 mmol/L (ref 22–32)
Calcium: 8.4 mg/dL — ABNORMAL LOW (ref 8.9–10.3)
Chloride: 102 mmol/L (ref 101–111)
Creatinine, Ser: 1 mg/dL (ref 0.61–1.24)
GFR calc Af Amer: >60 mL/min (ref >60)
GFR calc non Af Amer: >60 mL/min (ref >60)
GLUCOSE: 120 mg/dL — AB (ref 65–99)
POTASSIUM: 3.3 mmol/L — AB (ref 3.5–5.1)
Sodium: 134 mmol/L — ABNORMAL LOW (ref 135–145)
TOTAL PROTEIN: 7.6 g/dL (ref 6.5–8.1)

## 2017-05-22 LAB — MONONUCLEOSIS SCREEN: MONO SCREEN: NEGATIVE

## 2017-05-22 LAB — SEDIMENTATION RATE: SED RATE: 10 mm/h (ref 0–16)

## 2017-05-22 MED ORDER — DIPHENHYDRAMINE HCL 50 MG/ML IJ SOLN
25.0000 mg | Freq: Once | INTRAMUSCULAR | Status: AC
  Administered 2017-05-22: 25 mg via INTRAVENOUS
  Filled 2017-05-22: qty 1

## 2017-05-22 MED ORDER — ACETAMINOPHEN 500 MG PO TABS: 1000.0000 mg | ORAL_TABLET | Freq: Four times a day (QID) | ORAL | 0 refills | Status: DC | PRN

## 2017-05-22 MED ORDER — SODIUM CHLORIDE 0.9 % IV BOLUS (SEPSIS)
1000.0000 mL | Freq: Once | INTRAVENOUS | Status: AC
  Administered 2017-05-22: 1000 mL via INTRAVENOUS

## 2017-05-22 MED ORDER — IBUPROFEN 800 MG PO TABS
800.0000 mg | ORAL_TABLET | Freq: Once | ORAL | Status: AC
  Administered 2017-05-22: 800 mg via ORAL
  Filled 2017-05-22: qty 1

## 2017-05-22 MED ORDER — IBUPROFEN 800 MG PO TABS: 800.0000 mg | ORAL_TABLET | Freq: Three times a day (TID) | ORAL | 0 refills | Status: DC

## 2017-05-22 MED ORDER — PROCHLORPERAZINE EDISYLATE 5 MG/ML IJ SOLN
10.0000 mg | Freq: Once | INTRAMUSCULAR | Status: AC
  Administered 2017-05-22: 10 mg via INTRAVENOUS
  Filled 2017-05-22: qty 2

## 2017-05-22 MED ORDER — SODIUM CHLORIDE 0.9 % IV SOLN: 1000.0000 mL | INTRAVENOUS | Status: DC

## 2017-05-22 MED ORDER — ACETAMINOPHEN 325 MG PO TABS
650.0000 mg | ORAL_TABLET | Freq: Once | ORAL | Status: AC
Start: 2017-05-22 — End: 2017-05-22
  Administered 2017-05-22: 650 mg via ORAL
  Filled 2017-05-22: qty 2

## 2017-05-22 NOTE — Discharge Instructions (Signed)
1. Take ibuprofen and Tylenol for fever and body aches. Drink a lot of fluids and rest for the next 24 hours.  2. Return to the emergency department if your symptoms are changing or worsening.

## 2017-05-22 NOTE — ED Triage Notes (Signed)
Pt states that he has had a headache and fever x 3 days at home. Took tylenol earlier this morning. Alert and oriented.

## 2017-05-22 NOTE — ED Notes (Signed)
ED Provider at bedside. 

## 2017-05-22 NOTE — ED Notes (Addendum)
ED Provider at bedside for assessment.  

## 2017-05-22 NOTE — ED Provider Notes (Signed)
[facility]   CSN: [id number] Arrival date & time: 05/22/17  1106     History   Chief Complaint Chief Complaint  Patient presents with  . Headache    HPI Jay Smith is a 26 y.o. male.  HPI Patient ports he had a headache for about 3 days. He reports is at the top of his head and his temples. Is been intense and aching in quality. He reports he has had chills and subjective fever. He has not measured his temperature. Patient denies photophobia. He denies sore throat. He denies pain with movement of his neck. He denies recent cough, sputum, nausea, vomiting or abdominal pain. He denies generalized body myalgia. Patient works in outdoor environment loading and unloading trucks. He denies any tick bites or insect bites that he is aware of. He has not had any rashes, joint swelling. Past Medical History:  Diagnosis Date  . Asthma   . Eczema     Patient Active Problem List   Diagnosis Date Noted  . Low back pain 09/06/2016    Past Surgical History:  Procedure Laterality Date  . COSMETIC SURGERY         Home Medications    Prior to Admission medications   Medication Sig Start Date End Date Taking? Authorizing Provider  acetaminophen (TYLENOL) 500 MG tablet Take 2 tablets (1,000 mg total) by mouth every 6 (six) hours as needed. 05/22/17   Arby BarrettePfeiffer, Heavenlee Maiorana, MD  ibuprofen (ADVIL,MOTRIN) 600 MG tablet Take 1 tablet (600 mg total) by mouth every 6 (six) hours as needed. 03/28/17   Joy, Shawn C, PA-C  ibuprofen (ADVIL,MOTRIN) 800 MG tablet Take 1 tablet (800 mg total) by mouth 3 (three) times daily. 05/22/17   Arby BarrettePfeiffer, Beverlyann Broxterman, MD  magic mouthwash w/lidocaine SOLN Take 5 mLs by mouth 3 (three) times daily as needed for mouth pain. 03/28/17   Joy, Hillard DankerShawn C, PA-C    Family History Family History  Problem Relation Age of Onset  . Diabetes Brother   . Diabetes Maternal Grandmother   . Hypertension Maternal Grandmother   . Diabetes Maternal  Grandfather   . Hypertension Maternal Grandfather   . Heart disease Maternal Grandfather   . Diabetes Paternal Grandfather   . Hypertension Paternal Grandfather     Social History Social History  Substance Use Topics  . Smoking status: Never Smoker  . Smokeless tobacco: Never Used  . Alcohol use 0.0 oz/week     Comment: rare     Allergies   Hydrocodone   Review of Systems Review of Systems 10 Systems reviewed and are negative for acute change except as noted in the HPI.   Physical Exam Updated Vital Signs BP 110/74 (BP Location: Left Arm)   Pulse 88   Temp (!) 100.7 F (38.2 C) (Oral) Comment: Tylenol last night  Resp 18   Ht 5\' 7"  (1.702 m)   Wt 65.8 kg (145 lb)   SpO2 100%   BMI 22.71 kg/m   Physical Exam  Constitutional: He is oriented to person, place, and time. He appears well-developed and well-nourished. No distress.  HENT:  Head: Normocephalic and atraumatic.  Right Ear: External ear normal.  Left Ear: External ear normal.  Bilateral TMs normal. Posterior oropharynx diffuse erythema over the tonsils and tonsillar pillars. Strawberry tongue appearance. No exudates on tonsils.  Eyes: Pupils are equal, round, and reactive to light. EOM are normal.  Neck: Neck supple.  No meningismus. No lymphadenopathy.  Cardiovascular: Normal rate,  regular rhythm, normal heart sounds and intact distal pulses.   Pulmonary/Chest: Effort normal and breath sounds normal.  Abdominal: Soft. He exhibits no distension. There is no tenderness. There is no guarding.  Musculoskeletal: Normal range of motion. He exhibits no edema, tenderness or deformity.  No joint effusions. No peripheral edema.  Neurological: He is alert and oriented to person, place, and time. No cranial nerve deficit. He exhibits normal muscle tone. Coordination normal.  Skin: Skin is warm and dry. No rash noted.  Psychiatric: He has a normal mood and affect.     ED Treatments / Results  Labs (all labs  ordered are listed, but only abnormal results are displayed) Labs Reviewed  COMPREHENSIVE METABOLIC PANEL - Abnormal; Notable for the following:       Result Value   Sodium 134 (*)    Potassium 3.3 (*)    Glucose, Bld 120 (*)    Calcium 8.4 (*)    All other components within normal limits  CBC WITH DIFFERENTIAL/PLATELET - Abnormal; Notable for the following:    HCT 38.7 (*)    MCV 69.4 (*)    MCH 23.7 (*)    RDW 15.9 (*)    All other components within normal limits  URINALYSIS, ROUTINE W REFLEX MICROSCOPIC - Abnormal; Notable for the following:    Specific Gravity, Urine 1.041 (*)    Ketones, ur >80 (*)    Protein, ur 30 (*)    All other components within normal limits  URINALYSIS, MICROSCOPIC (REFLEX) - Abnormal; Notable for the following:    Bacteria, UA FEW (*)    Squamous Epithelial / LPF 0-5 (*)    All other components within normal limits  RAPID STREP SCREEN (NOT AT [facility])  CULTURE, GROUP A STREP ([facility])  RAPID HIV SCREEN (HIV 1/2 AB+AG)  SEDIMENTATION RATE  RAPID URINE DRUG SCREEN, HOSP PERFORMED  MONONUCLEOSIS SCREEN    EKG  EKG Interpretation None       Radiology No results found.  Procedures Procedures (including critical care time)  Medications Ordered in ED Medications  sodium chloride 0.9 % bolus 1,000 mL (0 mLs Intravenous Stopped 05/22/17 1400)    Followed by  sodium chloride 0.9 % bolus 1,000 mL (0 mLs Intravenous Stopped 05/22/17 1603)    Followed by  0.9 %  sodium chloride infusion (not administered)  acetaminophen (TYLENOL) tablet 650 mg (650 mg Oral Given 05/22/17 1148)  ibuprofen (ADVIL,MOTRIN) tablet 800 mg (800 mg Oral Given 05/22/17 1157)  prochlorperazine (COMPAZINE) injection 10 mg (10 mg Intravenous Given 05/22/17 1330)  diphenhydrAMINE (BENADRYL) injection 25 mg (25 mg Intravenous Given 05/22/17 1330)     Initial Impression / Assessment and Plan / ED Course  I have reviewed the triage vital signs and the nursing notes.  Pertinent labs  & imaging results that were available during my care of the patient were reviewed by me and considered in my medical decision making (see chart for details).     Final Clinical Impressions(s) / ED Diagnoses   Final diagnoses:  Bad headache  Dehydration   Patient presents with headache that he reports is severe at the temples and top of his head. He does have low-grade fever. Patient does not have meningismus. Neurologic exam is normal. ENT exam does show erythema of the tonsils without exudates or significant enlargement. Patient is not endorsing a sore throat. He has had 2 prior episodes of strep positive pharyngitis, last approximately 1 year ago. At this time, with patient denying any  sore throat and strep screen negative, will await cultures and not start empiric treatment. With hydration and ibuprofen, Tylenol and migraine therapy patient got complete relief of headache. I most suspect viral illness. Patient does not have meningismus, does not endorse any tick bite or insect exposure and labs are within normal limits. Return precautions are reviewed. Patient instructed on home management of headache and fever. New Prescriptions Discharge Medication List as of 05/22/2017  3:58 PM    START taking these medications   Details  acetaminophen (TYLENOL) 500 MG tablet Take 2 tablets (1,000 mg total) by mouth every 6 (six) hours as needed., Starting Mon 05/22/2017, Print    !! ibuprofen (ADVIL,MOTRIN) 800 MG tablet Take 1 tablet (800 mg total) by mouth 3 (three) times daily., Starting Mon 05/22/2017, Print     !! - Potential duplicate medications found. Please discuss with provider.       Arby Barrette, MD 05/22/17 [phone]

## 2017-05-22 NOTE — ED Triage Notes (Signed)
Cold x 3 days per friend. Headache. [facility] but was not seen due to long wait.

## 2017-05-22 NOTE — ED Notes (Signed)
[facility] called stating that patient is over there trying to check in and requesting us take patient's name out of system.

## 2017-05-24 LAB — CULTURE, GROUP A STREP (THRC)

## 2017-08-17 ENCOUNTER — Ambulatory Visit (INDEPENDENT_AMBULATORY_CARE_PROVIDER_SITE_OTHER): Payer: Self-pay | Admitting: Family Medicine

## 2017-08-17 ENCOUNTER — Encounter: Payer: Self-pay | Admitting: Family Medicine

## 2017-08-17 DIAGNOSIS — M545 Low back pain, unspecified: Secondary | ICD-10-CM

## 2017-08-17 MED ORDER — OXYCODONE-ACETAMINOPHEN 5-325 MG PO TABS
1.0000 | ORAL_TABLET | Freq: Four times a day (QID) | ORAL | 0 refills | Status: DC | PRN
Start: 2017-08-17 — End: 2018-01-20

## 2017-08-17 MED ORDER — PREDNISONE 10 MG PO TABS: ORAL_TABLET | ORAL | 0 refills | Status: DC

## 2017-08-17 NOTE — Patient Instructions (Signed)
You have a severe lumbar strain. Prednisone dose pack for 6 days - take as directed. You can take aleve or ibuprofen only AFTER finishing the prednisone Percocet as needed for severe pain (no driving on this medicine). Stay as active as possible. Do home exercises and stretches as directed as you start to improve. Consider massage, chiropractor, physical therapy, and/or acupuncture. Physical therapy has been shown to be helpful while the others have mixed results. Strengthening of low back muscles, abdominal musculature are key for long term pain relief. Call me in a week to let me know how you're doing.

## 2017-08-18 ENCOUNTER — Encounter: Payer: Self-pay | Admitting: Family Medicine

## 2017-08-18 NOTE — Progress Notes (Signed)
PCP: Patient, No Pcp Per  Subjective:   HPI: Patient is a 26 y.o. male here for low back pain.  Patient reports having bilateral low back pain for about 2 days. No acute injury or trauma. Woke up with pain at that time and has been severe with walking and standing. No radiation into legs. No bowel/bladder dysfunction. Tried heating pad, advil, goody powders without much benefit. Pain level 9/10 and sharp.  Past Medical History:  Diagnosis Date  . Asthma   . Eczema     Current Outpatient Medications on File Prior to Visit  Medication Sig Dispense Refill  . acetaminophen (TYLENOL) 500 MG tablet Take 2 tablets (1,000 mg total) by mouth every 6 (six) hours as needed. 30 tablet 0   No current facility-administered medications on file prior to visit.     Past Surgical History:  Procedure Laterality Date  . COSMETIC SURGERY      Allergies  Allergen Reactions  . Hydrocodone Swelling    Social History   Socioeconomic History  . Marital status: Single    Spouse name: Not on file  . Number of children: Not on file  . Years of education: Not on file  . Highest education level: Not on file  Social Needs  . Financial resource strain: Not on file  . Food insecurity - worry: Not on file  . Food insecurity - inability: Not on file  . Transportation needs - medical: Not on file  . Transportation needs - non-medical: Not on file  Occupational History  . Not on file  Tobacco Use  . Smoking status: Never Smoker  . Smokeless tobacco: Never Used  Substance and Sexual Activity  . Alcohol use: Yes    Alcohol/week: 0.0 oz    Comment: rare  . Drug use: No  . Sexual activity: Not on file  Other Topics Concern  . Not on file  Social History Narrative  . Not on file    Family History  Problem Relation Age of Onset  . Diabetes Brother   . Diabetes Maternal Grandmother   . Hypertension Maternal Grandmother   . Diabetes Maternal Grandfather   . Hypertension Maternal  Grandfather   . Heart disease Maternal Grandfather   . Diabetes Paternal Grandfather   . Hypertension Paternal Grandfather     BP 126/77   Pulse (!) 134   Ht 5\' 7"  (1.702 m)   Wt 145 lb (65.8 kg)   BMI 22.71 kg/m   Review of Systems: See HPI above.     Objective:  Physical Exam:  Gen: NAD, comfortable in exam room  Back: No gross deformity, scoliosis. No TTP paraspinal regions.  No midline or bony TTP. Full extension - can only flex to 20 degrees due to pain. Strength LEs 5/5 all muscle groups.   2+ MSRs in patellar and achilles tendons, equal bilaterally. Negative SLRs. Sensation intact to light touch bilaterally.  Bilateral hips: No gross deformity. No tenderness to palpation. FROM without pain.  5/5 strength. Negative logroll bilateral hips. Negative fabers and piriformis stretches.   Assessment & Plan:  1. Low back pain - consistent with severe lumbar strain.  OTC meds not helping him enough - start prednisone with percocet as needed.  Shown home exercises/stretches to do daily.  Call us in a week with an update on his status.

## 2017-08-18 NOTE — Assessment & Plan Note (Signed)
consistent with severe lumbar strain.  OTC meds not helping him enough - start prednisone with percocet as needed.  Shown home exercises/stretches to do daily.  Call us in a week with an update on his status.

## 2017-12-18 ENCOUNTER — Other Ambulatory Visit: Payer: Self-pay

## 2017-12-18 ENCOUNTER — Emergency Department (HOSPITAL_BASED_OUTPATIENT_CLINIC_OR_DEPARTMENT_OTHER): Payer: 59

## 2017-12-18 ENCOUNTER — Encounter (HOSPITAL_BASED_OUTPATIENT_CLINIC_OR_DEPARTMENT_OTHER): Payer: Self-pay | Admitting: Emergency Medicine

## 2017-12-18 ENCOUNTER — Emergency Department (HOSPITAL_BASED_OUTPATIENT_CLINIC_OR_DEPARTMENT_OTHER)
Admission: EM | Admit: 2017-12-18 | Discharge: 2017-12-18 | Disposition: A | Discharge status: Home / Self Care | Payer: 59 | Attending: Emergency Medicine | Admitting: Emergency Medicine

## 2017-12-18 DIAGNOSIS — J111 Influenza due to unidentified influenza virus with other respiratory manifestations: Secondary | ICD-10-CM | POA: Insufficient documentation

## 2017-12-18 DIAGNOSIS — J4521 Mild intermittent asthma with (acute) exacerbation: Secondary | ICD-10-CM | POA: Diagnosis not present

## 2017-12-18 DIAGNOSIS — R69 Illness, unspecified: Secondary | ICD-10-CM

## 2017-12-18 DIAGNOSIS — R0602 Shortness of breath: Secondary | ICD-10-CM | POA: Diagnosis present

## 2017-12-18 MED ORDER — PREDNISONE 20 MG PO TABS: 40.0000 mg | ORAL_TABLET | Freq: Every day | ORAL | 0 refills | Status: DC

## 2017-12-18 MED ORDER — IPRATROPIUM-ALBUTEROL 0.5-2.5 (3) MG/3ML IN SOLN
3.0000 mL | Freq: Once | RESPIRATORY_TRACT | Status: AC
  Administered 2017-12-18: 3 mL via RESPIRATORY_TRACT
  Filled 2017-12-18: qty 3

## 2017-12-18 MED ORDER — PREDNISONE 20 MG PO TABS
40.0000 mg | ORAL_TABLET | Freq: Once | ORAL | Status: AC
Start: 2017-12-18 — End: 2017-12-18
  Administered 2017-12-18: 40 mg via ORAL
  Filled 2017-12-18: qty 2

## 2017-12-18 MED ORDER — ALBUTEROL SULFATE HFA 108 (90 BASE) MCG/ACT IN AERS
2.0000 | INHALATION_SPRAY | RESPIRATORY_TRACT | Status: DC | PRN
  Administered 2017-12-18: 2 via RESPIRATORY_TRACT
  Filled 2017-12-18 (×2): qty 6.7

## 2017-12-18 MED FILL — predniSONE 20 MG TABS: 20 | 4 days supply | Qty: 8 | Fill #0

## 2017-12-18 NOTE — ED Notes (Signed)
ED Provider at bedside. 

## 2017-12-18 NOTE — ED Provider Notes (Signed)
[facility] EMERGENCY DEPARTMENT Provider Note   CSN: [id number] Arrival date & time: 12/18/17  1016     History   Chief Complaint Chief Complaint  Patient presents with  . Shortness of Breath    HPI Jay Smith is a 27 y.o. male.  27 year old male with past medical history including asthma and eczema who presents with chest cold and shortness of breath.  4 days ago, he began getting sick with cough, sore throat, fevers, and body aches.  He has had a sick contact at home.  Yesterday he began feeling short of breath and has been using his home asthma medication with no relief of symptoms.  He denies any vomiting, diarrhea, leg swelling, history of blood clots, recent travel, or history of cancer.  No medications today prior to arrival.   The history is provided by the patient.  Shortness of Breath     Past Medical History:  Diagnosis Date  . Asthma   . Eczema     Patient Active Problem List   Diagnosis Date Noted  . Low back pain 09/06/2016    Past Surgical History:  Procedure Laterality Date  . COSMETIC SURGERY         Home Medications    Prior to Admission medications   Medication Sig Start Date End Date Taking? Authorizing Provider  albuterol (PROVENTIL HFA;VENTOLIN HFA) 108 (90 Base) MCG/ACT inhaler Inhale 2 puffs into the lungs every 6 (six) hours as needed for wheezing or shortness of breath.   Yes [provider]  acetaminophen (TYLENOL) 500 MG tablet Take 2 tablets (1,000 mg total) by mouth every 6 (six) hours as needed. 05/22/17   Arby Barrette, MD  oxyCODONE-acetaminophen (PERCOCET/ROXICET) 5-325 MG tablet Take 1 tablet every 6 (six) hours as needed by mouth for severe pain. 08/17/17   Hudnall, Azucena Fallen, MD  predniSONE (DELTASONE) 20 MG tablet Take 2 tablets (40 mg total) by mouth daily. 12/19/17   Jakori Burkett, Ambrose Finland, MD    Family History Family History  Problem Relation Age of Onset  . Diabetes Brother   . Diabetes  Maternal Grandmother   . Hypertension Maternal Grandmother   . Diabetes Maternal Grandfather   . Hypertension Maternal Grandfather   . Heart disease Maternal Grandfather   . Diabetes Paternal Grandfather   . Hypertension Paternal Grandfather     Social History Social History   Tobacco Use  . Smoking status: Never Smoker  . Smokeless tobacco: Never Used  Substance Use Topics  . Alcohol use: Yes    Alcohol/week: 0.0 oz    Comment: rare  . Drug use: No     Allergies   Hydrocodone   Review of Systems Review of Systems  Respiratory: Positive for shortness of breath.    All other systems reviewed and are negative except that which was mentioned in HPI   Physical Exam Updated Vital Signs BP 113/76 (BP Location: Right Arm)   Pulse 96   Temp 98.2 F (36.8 C) (Oral)   Resp 18   Ht 5\' 7"  (1.702 m)   Wt 63.5 kg (140 lb)   SpO2 100%   BMI 21.93 kg/m   Physical Exam  Constitutional: He is oriented to person, place, and time. He appears well-developed and well-nourished. No distress.  HENT:  Head: Normocephalic and atraumatic.  Mouth/Throat: Oropharynx is clear and moist.  Moist mucous membranes  Eyes: Conjunctivae are normal. Pupils are equal, round, and reactive to light.  Neck: Neck supple.  Cardiovascular: Normal rate, regular rhythm and normal heart sounds.  No murmur heard. Pulmonary/Chest: Effort normal.  Mildly diminished b/l but no wheezing  Abdominal: Soft. Bowel sounds are normal. He exhibits no distension. There is no tenderness.  Musculoskeletal: He exhibits no edema.  Neurological: He is alert and oriented to person, place, and time.  Fluent speech  Skin: Skin is warm and dry.  Psychiatric: He has a normal mood and affect. Judgment normal.  Nursing note and vitals reviewed.    ED Treatments / Results  Labs (all labs ordered are listed, but only abnormal results are displayed) Labs Reviewed - No data to display  EKG  EKG Interpretation None         Radiology Dg Chest 2 View  Result Date: 12/18/2017 CLINICAL DATA:  Cough, fever, congestion, shortness of breath and body aches for 4 days. EXAM: CHEST - 2 VIEW COMPARISON:  PA and lateral chest 11/01/2015 and 08/17/2015. FINDINGS: Lung volumes are low but the lungs are clear. Heart size is normal. No pneumothorax or pleural effusion. No bony abnormality. IMPRESSION: Negative chest. Electronically Signed   By: Drusilla Kannerhomas  Dalessio M.D.   On: 12/18/2017 10:48    Procedures Procedures (including critical care time)  Medications Ordered in ED Medications  albuterol (PROVENTIL HFA;VENTOLIN HFA) 108 (90 Base) MCG/ACT inhaler 2 puff (not administered)  predniSONE (DELTASONE) tablet 40 mg (not administered)  ipratropium-albuterol (DUONEB) 0.5-2.5 (3) MG/3ML nebulizer solution 3 mL (3 mLs Nebulization Given 12/18/17 1047)     Initial Impression / Assessment and Plan / ED Course  I have reviewed the triage vital signs and the nursing notes.  Pertinent imaging results that were available during my care of the patient were reviewed by me and considered in my medical decision making (see chart for details).     Pt well appearing on exam w/ normal VS. O2 sat 100%, no tachycardia. No wheezing on exam. CXR negative acute.  Regarding shortness of breath, no risk factors for PE and he is PERC negative. After duoneb, on reassessment he did have occasional inspiratory and expiratory wheezes. Therefore started prednisone course to treat asthma exacerbation.  Sx c/w flu like illness causing mild asthma exacerbation. Given he is 4 days into illness, I do not feel tamiflu would offer much benefit to him. Have discussed supportive measures and extensively reviewed return precautions.  Final Clinical Impressions(s) / ED Diagnoses   Final diagnoses:  Influenza-like illness  Mild intermittent asthma with exacerbation    ED Discharge Orders        Ordered    predniSONE (DELTASONE) 20 MG tablet  Daily      12/18/17 1115       Kamila Broda, Ambrose Finlandachel Morgan, MD 12/18/17 1118

## 2017-12-18 NOTE — ED Triage Notes (Signed)
SOB since yesterday.  "Chest cold" x4 days.  Hx of asthma.  Sts home meds not working for him since yesterday.

## 2018-01-19 ENCOUNTER — Ambulatory Visit: Payer: Self-pay | Admitting: Physician Assistant

## 2018-01-20 ENCOUNTER — Ambulatory Visit (INDEPENDENT_AMBULATORY_CARE_PROVIDER_SITE_OTHER): Payer: Self-pay | Admitting: Family Medicine

## 2018-01-20 ENCOUNTER — Other Ambulatory Visit: Payer: Self-pay

## 2018-01-20 ENCOUNTER — Encounter: Payer: Self-pay | Admitting: Family Medicine

## 2018-01-20 VITALS — BP 107/73 | HR 73 | Temp 98.5°F | Resp 16 | Ht 66.34 in | Wt 159.0 lb

## 2018-01-20 DIAGNOSIS — Z024 Encounter for examination for driving license: Secondary | ICD-10-CM

## 2018-01-20 NOTE — Patient Instructions (Signed)
     IF you received an x-ray today, you will receive an invoice from [facility]. Please contact [facility] at [phone] with questions or concerns regarding your invoice.   IF you received labwork today, you will receive an invoice from [facility]. Please contact [facility] at [phone] with questions or concerns regarding your invoice.   Our billing staff will not be able to assist you with questions regarding bills from these companies.  You will be contacted with the lab results as soon as they are available. The fastest way to get your results is to activate your My Chart account. Instructions are located on the last page of this paperwork. If you have not heard from us regarding the results in 2 weeks, please contact this office.     

## 2018-01-20 NOTE — Progress Notes (Signed)
By signing my name below, I, Stann Oresung-Kai Tsai, attest that this documentation has been prepared under the direction and in the presence of Norberto SorensonEva Emmakate Hypes, MD. Electronically Signed: Stann Oresung-Kai Tsai, Scribe. 01/20/2018 , 3:52 PM .   Patient was seen in Room 3 .  Patient ID: Jay Smith, male   DOB: 1991/07/07, 27 y.o.   MRN: [id number]   Chief Complaint  Patient presents with  . DOT Physical   Commercial Driver Medical Examination  Jay Smith is a 27 y.o. male who presents today for a commercial driver fitness determination physical exam. The patient reports no problems.   Review of Systems Pertinent items are noted in HPI.  Objective: Patient denies any major health changes since last visit.  History of in-grown toenails - 10 years ago.  History of hospitalization when he was younger.  H/o right great toe fracture  Asthma: Recently had asthma flair after ill w/ flu but no problems like that at all prior; received an inhaler and has been using it. He had history of childhood asthma.   Immunizations: Did not receive flu shot this year.   Vision: Visual Acuity in Right Eye - Without correction:   With correction: 20/25 Visual Acuity in Left Eye - Without correction:   With correction: 20/25 Visual Acuity in Both Eyes - Without correction:   With correction: 20/25  Applicant can recognize and distinguish among traffic control signals and devices showing standard red, green, and amber colors.  Applicant meets visual acuity requirement only when wearing corrective lenses (contacts).   Monocular Vision?: No  BP 107/73   Pulse 73   Temp 98.5 F (36.9 C) (Oral)   Resp 16   Ht 5' 6.34" (1.685 m)   Wt 159 lb (72.1 kg)   SpO2 98%   BMI 25.40 kg/m   General Appearance:    Alert, cooperative, no distress, appears stated age Head:    Normocephalic, without obvious abnormality, atraumatic Eyes:    PERRL, conjunctiva/corneas clear, EOM's intact both eyes      Ears:     Normal TM's and external ear canals, both ears Nose:   Nares normal, septum midline, mucosa normal, no drainage or sinus tenderness Throat:   Lips, mucosa, and tongue normal Neck:   Supple, symmetrical, trachea midline, no adenopathy;       thyroid:  No enlargement/tenderness/nodules; no carotid   bruit or JVD Back:     Symmetric, no curvature, ROM normal, no CVA tenderness Lungs:     Clear to auscultation bilaterally, respirations unlabored Chest wall:    No tenderness or deformity Heart:    Regular rate and rhythm, S1 and S2 normal, no murmur, rub   or gallop Abdomen:     Soft, non-tender, normal bowel sounds, active all four quadrants,    no masses, no organomegaly, no hepatosplenomegaly Extremities:   Extremities normal, atraumatic, no cyanosis or edema; negative straight leg raise bilaterally Pulses:   2+ and symmetric all extremities Skin:   Skin color, texture, turgor normal, no rashes or lesions Lymph nodes:   Cervical and supraclavicular nodes normal; no lymphadenopathy Neurologic:   CNII-XII intact. Normal strength, sensation and reflexes throughout; 2+ patellar DTR's  Labs: Lab Results      Component                Value               Date  PROTEINUR               NEGATIVE            05/22/2017                BILIRUBINUR              NEGATIVE            05/22/2017                GLUCOSEU                NEGATIVE            05/22/2017             Assessment:  Healthy male exam.  Meets standards in 13 CFR 391.41;  qualifies for 2 year certificate.   Plan:  Medical examiners certificate completed and printed. Return as needed.

## 2018-04-04 IMAGING — CR DG CHEST 2V
2 series · 2 of 2 positions shown · non-contrast
Comparison: PA and lateral chest 11/01/2015 and 08/17/2015.

CLINICAL DATA: Cough, fever, congestion, shortness of breath and
body aches for 4 days.

EXAM:
CHEST - 2 VIEW

[w chest pa]
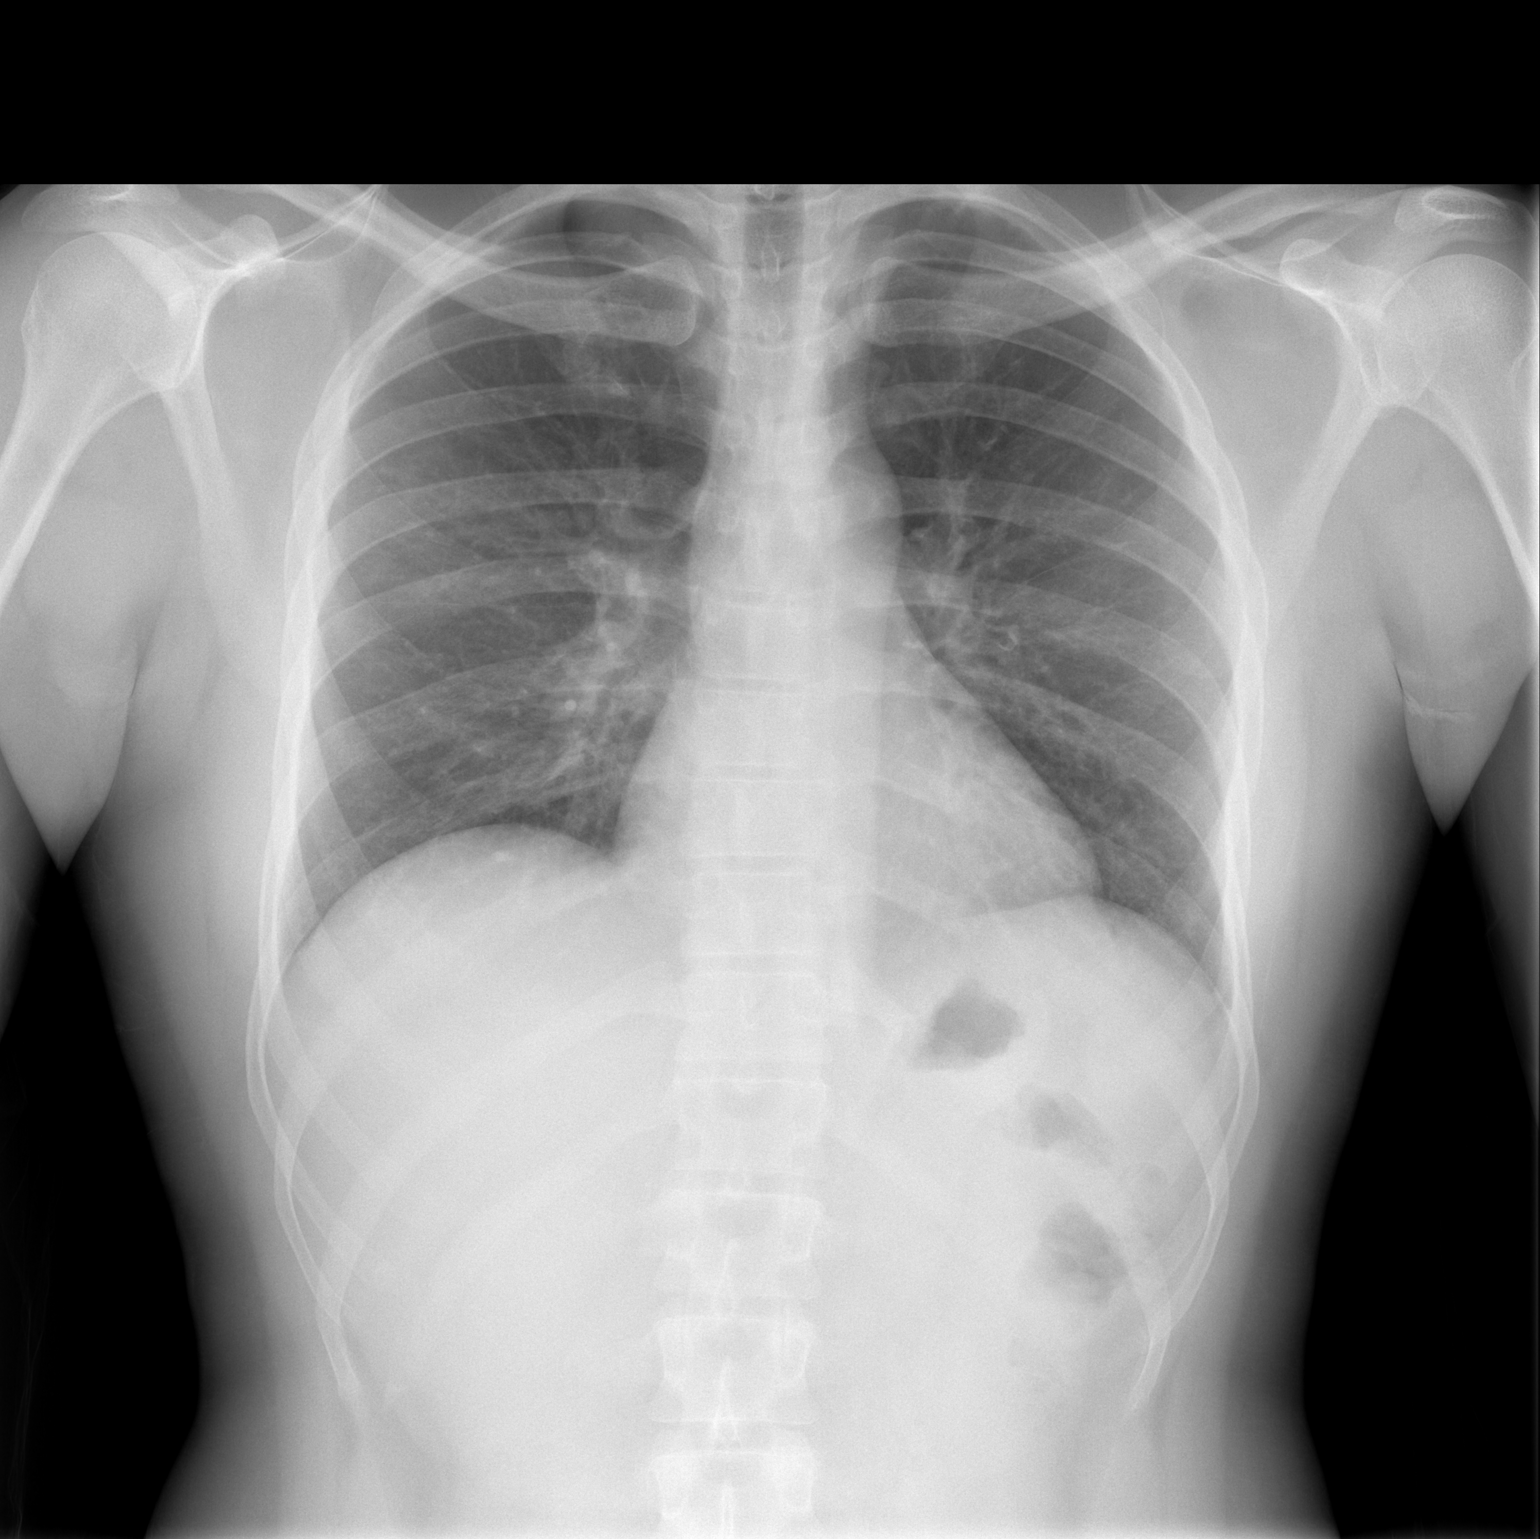

[w chest lat]
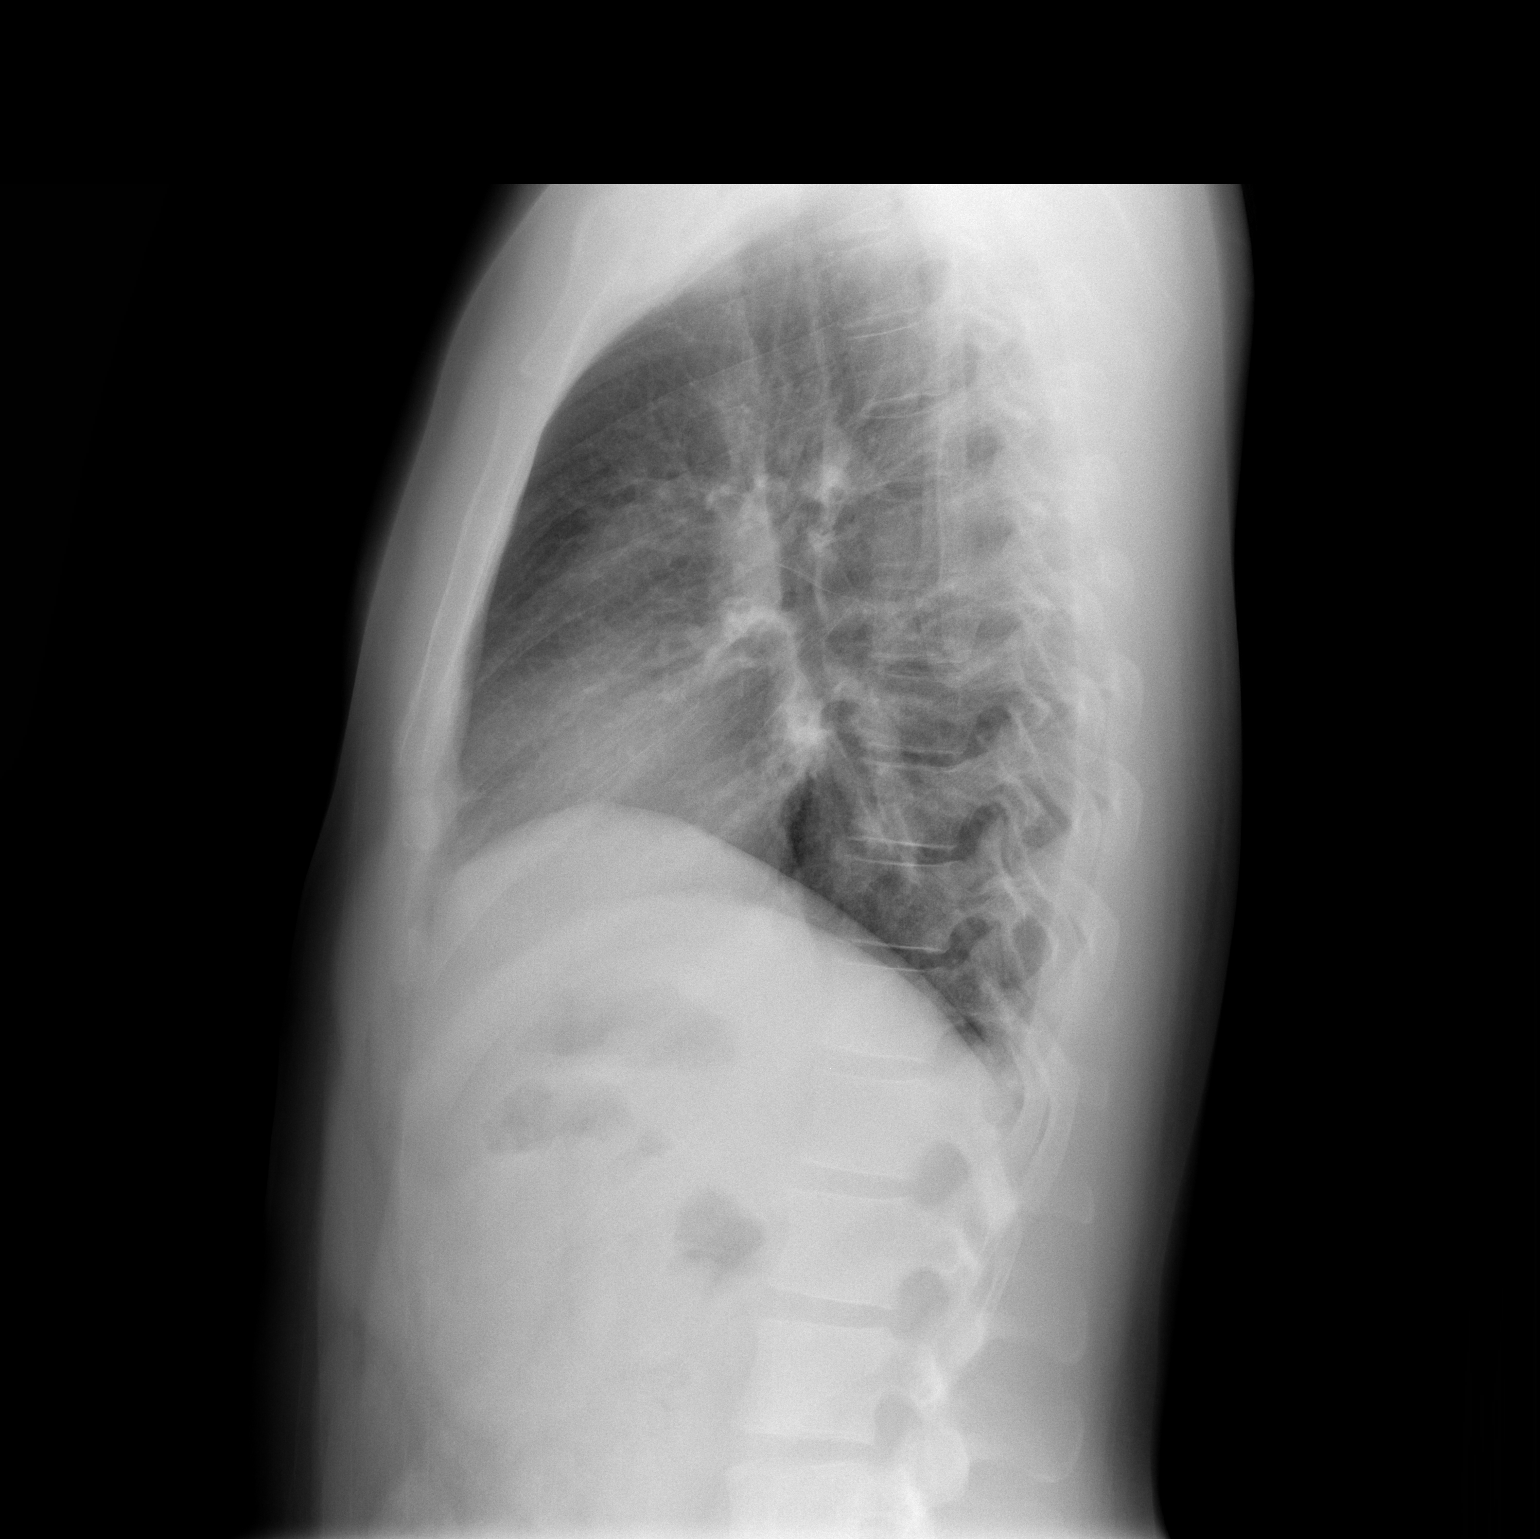

[2 of 2 positions shown; findings below may reference images not displayed]

FINDINGS: Lung volumes are low but the lungs are clear. Heart size is normal.
No pneumothorax or pleural effusion. No bony abnormality.
IMPRESSION: Negative chest.

## 2018-06-01 ENCOUNTER — Other Ambulatory Visit (HOSPITAL_COMMUNITY)
Admission: RE | Admit: 2018-06-01 | Discharge: 2018-06-01 | Disposition: A | Discharge status: Home / Self Care | Payer: Managed Care, Other (non HMO) | Source: Ambulatory Visit | Attending: Family | Admitting: Family

## 2018-06-01 ENCOUNTER — Ambulatory Visit: Payer: Self-pay

## 2018-06-01 ENCOUNTER — Other Ambulatory Visit: Payer: Self-pay

## 2018-06-01 DIAGNOSIS — Z113 Encounter for screening for infections with a predominantly sexual mode of transmission: Secondary | ICD-10-CM | POA: Diagnosis not present

## 2018-06-01 DIAGNOSIS — Z79899 Other long term (current) drug therapy: Secondary | ICD-10-CM

## 2018-06-01 DIAGNOSIS — B20 Human immunodeficiency virus [HIV] disease: Secondary | ICD-10-CM

## 2018-06-01 LAB — T-HELPER CELL (CD4) - (RCID CLINIC ONLY)
CD4 % Helper T Cell: 26 % — ABNORMAL LOW (ref 33–55)
CD4 T Cell Abs: 690 /uL (ref 400–2700)

## 2018-06-02 LAB — URINALYSIS
BILIRUBIN URINE: NEGATIVE
Glucose, UA: NEGATIVE
Hgb urine dipstick: NEGATIVE
KETONES UR: NEGATIVE
Leukocytes, UA: NEGATIVE
Nitrite: NEGATIVE
PROTEIN: NEGATIVE
Specific Gravity, Urine: 1.028 (ref 1.001–1.03)
pH: 5.5 (ref 5.0–8.0)

## 2018-06-04 LAB — URINE CYTOLOGY ANCILLARY ONLY
CHLAMYDIA, DNA PROBE: NEGATIVE
Neisseria Gonorrhea: NEGATIVE

## 2018-06-13 LAB — RPR: RPR: NONREACTIVE

## 2018-06-13 LAB — COMPLETE METABOLIC PANEL WITH GFR
AG Ratio: 1.2 (calc) (ref 1.0–2.5)
ALKALINE PHOSPHATASE (APISO): 65 U/L (ref 40–115)
ALT: 14 U/L (ref 9–46)
AST: 13 U/L (ref 10–40)
Albumin: 4.2 g/dL (ref 3.6–5.1)
BUN: 19 mg/dL (ref 7–25)
CALCIUM: 9.5 mg/dL (ref 8.6–10.3)
CO2: 24 mmol/L (ref 20–32)
CREATININE: 1.03 mg/dL (ref 0.60–1.35)
Chloride: 106 mmol/L (ref 98–110)
GFR, EST NON AFRICAN AMERICAN: 99 mL/min/{1.73_m2} (ref >=60)
GFR, Est African American: 115 mL/min/{1.73_m2} (ref >=60)
GLUCOSE: 101 mg/dL — AB (ref 65–99)
Globulin: 3.5 g/dL (calc) (ref 1.9–3.7)
Potassium: 4.5 mmol/L (ref 3.5–5.3)
SODIUM: 139 mmol/L (ref 135–146)
Total Bilirubin: 0.4 mg/dL (ref 0.2–1.2)
Total Protein: 7.7 g/dL (ref 6.1–8.1)

## 2018-06-13 LAB — HIV-1 RNA ULTRAQUANT REFLEX TO GENTYP+
HIV 1 RNA QUANT: 24700 {copies}/mL — AB
HIV-1 RNA QUANT, LOG: 4.39 {Log_copies}/mL — AB

## 2018-06-13 LAB — QUANTIFERON-TB GOLD PLUS
NIL: 0.08 [IU]/mL
QUANTIFERON-TB GOLD PLUS: NEGATIVE
TB1-NIL: 0 IU/mL
TB2-NIL: 0.01 [IU]/mL

## 2018-06-13 LAB — CBC WITH DIFFERENTIAL/PLATELET
BASOS ABS: 28 {cells}/uL (ref 0–200)
Basophils Relative: 0.6 %
EOS PCT: 0.9 %
Eosinophils Absolute: 42 cells/uL (ref 15–500)
HCT: 41.8 % (ref 38.5–50.0)
Hemoglobin: 13.3 g/dL (ref 13.2–17.1)
LYMPHS ABS: 2571 {cells}/uL (ref 850–3900)
MCH: 23.7 pg — ABNORMAL LOW (ref 27.0–33.0)
MCHC: 31.8 g/dL — AB (ref 32.0–36.0)
MCV: 74.4 fL — ABNORMAL LOW (ref 80.0–100.0)
MONOS PCT: 7.9 %
MPV: 11.3 fL (ref 7.5–12.5)
NEUTROS PCT: 35.9 %
Neutro Abs: 1687 cells/uL (ref 1500–7800)
PLATELETS: 231 10*3/uL (ref 140–400)
RBC: 5.62 10*6/uL (ref 4.20–5.80)
RDW: 15.9 % — AB (ref 11.0–15.0)
Total Lymphocyte: 54.7 %
WBC mixed population: 371 cells/uL (ref 200–950)
WBC: 4.7 10*3/uL (ref 3.8–10.8)

## 2018-06-13 LAB — HEPATITIS B SURFACE ANTIGEN: Hepatitis B Surface Ag: NONREACTIVE

## 2018-06-13 LAB — HIV-1 GENOTYPE: HIV-1 Genotype: DETECTED — AB

## 2018-06-13 LAB — LIPID PANEL
CHOL/HDL RATIO: 6.5 (calc) — AB (ref <5.0)
CHOLESTEROL: 194 mg/dL (ref <200)
HDL: 30 mg/dL — AB (ref >40)
LDL CHOLESTEROL (CALC): 122 mg/dL — AB
Non-HDL Cholesterol (Calc): 164 mg/dL (calc) — ABNORMAL HIGH (ref <130)
TRIGLYCERIDES: 295 mg/dL — AB (ref <150)

## 2018-06-13 LAB — HLA B*5701: HLA-B*5701 w/rflx HLA-B High: NEGATIVE

## 2018-06-13 LAB — HEPATITIS C ANTIBODY
Hepatitis C Ab: NONREACTIVE
SIGNAL TO CUT-OFF: 0.03 (ref <1.00)

## 2018-06-13 LAB — HIV-1/2 AB - DIFFERENTIATION
HIV 1 ANTIBODY: POSITIVE — AB
HIV 2 AB: NEGATIVE

## 2018-06-13 LAB — HIV ANTIBODY (ROUTINE TESTING W REFLEX)

## 2018-06-13 LAB — HEPATITIS A ANTIBODY, TOTAL: HEPATITIS A AB,TOTAL: NONREACTIVE

## 2018-06-13 LAB — HEPATITIS B CORE ANTIBODY, TOTAL: HEP B C TOTAL AB: NONREACTIVE

## 2018-06-13 LAB — HEPATITIS B SURFACE ANTIBODY,QUALITATIVE: Hep B S Ab: BORDERLINE — AB

## 2018-06-21 ENCOUNTER — Ambulatory Visit (INDEPENDENT_AMBULATORY_CARE_PROVIDER_SITE_OTHER): Payer: Managed Care, Other (non HMO) | Admitting: Pharmacist

## 2018-06-21 ENCOUNTER — Ambulatory Visit (INDEPENDENT_AMBULATORY_CARE_PROVIDER_SITE_OTHER): Payer: Managed Care, Other (non HMO) | Admitting: Family

## 2018-06-21 ENCOUNTER — Encounter: Payer: Self-pay | Admitting: Family

## 2018-06-21 VITALS — BP 114/70 | HR 74 | Temp 98.8°F | Wt 164.8 lb

## 2018-06-21 DIAGNOSIS — B20 Human immunodeficiency virus [HIV] disease: Secondary | ICD-10-CM

## 2018-06-21 MED ORDER — BICTEGRAVIR-EMTRICITAB-TENOFOV 50-200-25 MG PO TABS: 1.0000 | ORAL_TABLET | Freq: Every day | ORAL | 2 refills | Status: DC

## 2018-06-21 MED FILL — BIKTARVY 50-200-25 MG TABS: 50-200-25 | 30 days supply | Qty: 30 | Fill #0

## 2018-06-21 NOTE — Patient Instructions (Signed)
Nice to meet you!  We will get you started on Biktarvy.  Please let us know if you have any questions.  Plan for follow up in 1 month or sooner if needed. We will do blood work at this appointment.

## 2018-06-21 NOTE — Progress Notes (Signed)
HPI: Jay Smith is a 27 y.o. male who presents to the [facility] clinic today to initiate care with Tammy Sours for his newly diagnosed HIV infection.  Patient Active Problem List   Diagnosis Date Noted  . Low back pain 09/06/2016    Patient's Medications  New Prescriptions   No medications on file  Previous Medications   ALBUTEROL (PROVENTIL HFA;VENTOLIN HFA) 108 (90 BASE) MCG/ACT INHALER    Inhale 2 puffs into the lungs every 6 (six) hours as needed for wheezing or shortness of breath.   BICTEGRAVIR-EMTRICITABINE-TENOFOVIR AF (BIKTARVY) 50-200-25 MG TABS TABLET    Take 1 tablet by mouth daily.  Modified Medications   No medications on file  Discontinued Medications   No medications on file    Allergies: Allergies  Allergen Reactions  . Hydrocodone Swelling    Past Medical History: Past Medical History:  Diagnosis Date  . Asthma   . Eczema   . HIV infection [facility])     Social History: Social History   Socioeconomic History  . Marital status: Single    Spouse name: Not on file  . Number of children: Not on file  . Years of education: Not on file  . Highest education level: Not on file  Occupational History  . Occupation: Heritage manager  Social Needs  . Financial resource strain: Not on file  . Food insecurity:    Worry: Not on file    Inability: Not on file  . Transportation needs:    Medical: Not on file    Non-medical: Not on file  Tobacco Use  . Smoking status: Never Smoker  . Smokeless tobacco: Never Used  Substance and Sexual Activity  . Alcohol use: Yes    Alcohol/week: 0.0 standard drinks    Comment: rare  . Drug use: No  . Sexual activity: Yes    Birth control/protection: Condom  Lifestyle  . Physical activity:    Days per week: Not on file    Minutes per session: Not on file  . Stress: Not on file  Relationships  . Social connections:    Talks on phone: Not on file    Gets together: Not on file    Attends religious service: Not on file      Active member of club or organization: Not on file    Attends meetings of clubs or organizations: Not on file    Relationship status: Not on file  Other Topics Concern  . Not on file  Social History Narrative  . Not on file    Labs: Lab Results  Component Value Date   HIV1RNAQUANT 24,700 (H) 06/01/2018   CD4TABS 690 06/01/2018    RPR and STI Lab Results  Component Value Date   LABRPR NON-REACTIVE 06/01/2018   LABRPR Reactive (A) 02/12/2015    STI Results GC CT  06/01/2018 Negative Negative  02/12/2015 Negative Negative    Hepatitis B Lab Results  Component Value Date   HEPBSAB BORDERLINE (A) 06/01/2018   HEPBSAG NON-REACTIVE 06/01/2018   HEPBCAB NON-REACTIVE 06/01/2018   Hepatitis C Lab Results  Component Value Date   HEPCAB NON-REACTIVE 06/01/2018   Hepatitis A Lab Results  Component Value Date   HAV NON-REACTIVE 06/01/2018   Lipids: Lab Results  Component Value Date   CHOL 194 06/01/2018   TRIG 295 (H) 06/01/2018   HDL 30 (L) 06/01/2018   CHOLHDL 6.5 (H) 06/01/2018   LDLCALC 122 (H) 06/01/2018    Current HIV Regimen: Treatment naive  Assessment: Jay Smith is here today to initiate care for his newly diagnosed HIV infection.  He is treatment naive with an initial HIV viral load of 24,700 and a CD4 count of 690. We will start Biktarvy for him today.  Counseled on how to take Biktarvy and possible side effects such as headaches and nausea.  Emphasized the importance of adherence and not missing any doses.  He does not take any other medications, but I told him to call me if he started anything new.  Gave him my card to call me with any issues.  [facility]  Plan: - Start Biktarvy PO once daily - [facility]  Joycie Aerts L. Dasani Thurlow, PharmD, AAHIVP, CPP Infectious Diseases Clinical Pharmacist [facility] 06/21/2018, 4:00 PM

## 2018-06-21 NOTE — Progress Notes (Signed)
Subjective:    Patient ID: Jay Smith, male    DOB: 12-01-1990, 27 y.o.   MRN: [id number]  Chief Complaint  Patient presents with  . HIV Positive/AIDS    HPI:  Jay Smith is a 27 y.o. male who presents today for initial office evaluation of HIV disease.  Jay Smith was recently diagnosed with HIV-1 during a STI screening as his partner informed him that he was having discharge at the time. His risk factor for HIV is MSM. He has not been treated since initial diagnosis. He has disclosed his positive status to his Uncle who also is HIV positive. Not currently experiencing any symptoms. 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   Jay Smith initial blood work shows a viral load of 24,700 and a CD4 count of 690. Genotype is wild. His HLA-[id number] was negative. He was negative for gonorrhea, chlamydia and syphilis. Hepatitis B surface antibody was boderline with no infection of Hepatitis B or Hepatitis C. Qunatiferon was negative. Cholesterol levels were slightly elevated at LDL 122 with a decreased HDL of 30. His PHQ2 score today was 0 with no evidence of depression.  Allergies  Allergen Reactions  . Hydrocodone Swelling      Outpatient Medications Prior to Visit  Medication Sig Dispense Refill  . albuterol (PROVENTIL HFA;VENTOLIN HFA) 108 (90 Base) MCG/ACT inhaler Inhale 2 puffs into the lungs every 6 (six) hours as needed for wheezing or shortness of breath.    Marland Kitchen acetaminophen (TYLENOL) 500 MG tablet Take 2 tablets (1,000 mg total) by mouth every 6 (six) hours as needed. 30 tablet 0   No facility-administered medications prior to visit.      Past Medical History:  Diagnosis Date  . Asthma   . Eczema   . HIV infection [facility])       Past Surgical History:  Procedure Laterality Date  . COSMETIC SURGERY     Great Toe Surgery      Family History  Problem Relation Age of Onset  . Diabetes Brother   . Diabetes Maternal  Grandmother   . Hypertension Maternal Grandmother   . Diabetes Maternal Grandfather   . Hypertension Maternal Grandfather   . Heart disease Maternal Grandfather   . Diabetes Paternal Grandfather   . Hypertension Paternal Grandfather       Social History   Socioeconomic History  . Marital status: Single    Spouse name: Not on file  . Number of children: Not on file  . Years of education: Not on file  . Highest education level: Not on file  Occupational History  . Occupation: Information systems manager  Social Needs  . Financial resource strain: Not on file  . Food insecurity:    Worry: Not on file    Inability: Not on file  . Transportation needs:    Medical: Not on file    Non-medical: Not on file  Tobacco Use  . Smoking status: Never Smoker  . Smokeless tobacco: Never Used  Substance and Sexual Activity  . Alcohol use: Yes    Alcohol/week: 0.0 standard drinks    Comment: rare  . Drug use: No  . Sexual activity: Yes    Birth control/protection: Condom  Lifestyle  . Physical activity:    Days per week: Not on file    Minutes per session: Not on file  . Stress: Not on file  Relationships  . Social connections:    Talks on phone: Not on file  Gets together: Not on file    Attends religious service: Not on file    Active member of club or organization: Not on file    Attends meetings of clubs or organizations: Not on file    Relationship status: Not on file  . Intimate partner violence:    Fear of current or ex partner: Not on file    Emotionally abused: Not on file    Physically abused: Not on file    Forced sexual activity: Not on file  Other Topics Concern  . Not on file  Social History Narrative  . Not on file      Review of Systems  Constitutional: Negative for appetite change, chills, fatigue, fever and unexpected weight change.  Eyes: Negative for visual disturbance.  Respiratory: Negative for cough, chest tightness, shortness of breath and wheezing.     Cardiovascular: Negative for chest pain and leg swelling.  Gastrointestinal: Negative for abdominal pain, constipation, diarrhea, nausea and vomiting.  Genitourinary: Negative for dysuria, flank pain, frequency, genital sores, hematuria and urgency.  Skin: Negative for rash.  Allergic/Immunologic: Negative for immunocompromised state.  Neurological: Negative for dizziness and headaches.       Objective:    BP 114/70   Pulse 74   Temp 98.8 F (37.1 C)   Wt 164 lb 12.8 oz (74.8 kg)   BMI 26.33 kg/m  Nursing note and vital signs reviewed.  Physical Exam  Constitutional: He is oriented to person, place, and time. He appears well-developed. No distress.  HENT:  Mouth/Throat: Oropharynx is clear and moist.  Eyes: Conjunctivae are normal.  Neck: Neck supple.  Cardiovascular: Normal rate, regular rhythm, normal heart sounds and intact distal pulses. Exam reveals no gallop and no friction rub.  No murmur heard. Pulmonary/Chest: Effort normal and breath sounds normal. No respiratory distress. He has no wheezes. He has no rales. He exhibits no tenderness.  Abdominal: Soft. Bowel sounds are normal. There is no tenderness.  Lymphadenopathy:    He has no cervical adenopathy.  Neurological: He is alert and oriented to person, place, and time.  Skin: Skin is warm and dry. No rash noted.  Psychiatric: He has a normal mood and affect. His behavior is normal. Judgment and thought content normal.        Assessment & Plan:   Problem List Items Addressed This Visit      Other   HIV disease (Advance) - Primary    Jay Smith has newly diagnosed HIV disease with his risk factor for obtaining HIV being MSM.  He has not been treated to date.  His initial viral load was 24,700 with a CD4 count of 690.  His genotype was wild.  He has no signs/symptoms of opportunistic infection through history or physical exam.  Declines immunizations today.  Educated regarding HIV transmission, prevention, risks of  progressing if left untreated, and treatment options. After meeting with pharmacy staff we will start him on [place].  He has a good support system around him with no evidence of depression.  Declines any additional assistance from counseling services. Plan for follow up office visit in 1 month to recheck CD4 count and viral load or sooner if needed.       Relevant Medications   bictegravir-emtricitabine-tenofovir AF (BIKTARVY) 50-200-25 MG TABS tablet       I have discontinued Ekansh H. Magnussen's acetaminophen. I am also having him start on bictegravir-emtricitabine-tenofovir AF. Additionally, I am having him maintain his albuterol.   Meds ordered  this encounter  Medications  . bictegravir-emtricitabine-tenofovir AF (BIKTARVY) 50-200-25 MG TABS tablet    Sig: Take 1 tablet by mouth daily.    Dispense:  30 tablet    Refill:  2     Follow-up: Return in about 1 month (around 07/21/2018), or if symptoms worsen or fail to improve.    Terri Piedra, MSN, FNP-C Nurse Practitioner [facility] for [facility] Office phone: [phone] Pager: [place] number: [phone]

## 2018-06-22 DIAGNOSIS — B20 Human immunodeficiency virus [HIV] disease: Secondary | ICD-10-CM | POA: Insufficient documentation

## 2018-06-22 NOTE — Assessment & Plan Note (Signed)
[name] has newly diagnosed HIV disease with his risk factor for obtaining HIV being MSM.  He has not been treated to date.  His initial viral load was 24,700 with a CD4 count of 690.  His genotype was wild.  He has no signs/symptoms of opportunistic infection through history or physical exam.  Declines immunizations today.  Educated regarding HIV transmission, prevention, risks of progressing if left untreated, and treatment options. After meeting with pharmacy staff we will start him on Biktarvy.  He has a good support system around him with no evidence of depression.  Declines any additional assistance from counseling services. Plan for follow up office visit in 1 month to recheck CD4 count and viral load or sooner if needed.

## 2018-07-18 MED FILL — BIKTARVY 50-200-25 MG TABS: 50-200-25 | 30 days supply | Qty: 30 | Fill #1

## 2018-07-19 ENCOUNTER — Encounter: Payer: Self-pay | Admitting: Family

## 2018-07-19 ENCOUNTER — Ambulatory Visit (INDEPENDENT_AMBULATORY_CARE_PROVIDER_SITE_OTHER): Payer: Managed Care, Other (non HMO) | Admitting: Family

## 2018-07-19 VITALS — BP 116/79 | HR 78 | Temp 98.6°F | Ht 67.0 in | Wt 165.0 lb

## 2018-07-19 DIAGNOSIS — B20 Human immunodeficiency virus [HIV] disease: Secondary | ICD-10-CM

## 2018-07-19 DIAGNOSIS — Z23 Encounter for immunization: Secondary | ICD-10-CM | POA: Diagnosis not present

## 2018-07-19 NOTE — Progress Notes (Signed)
Subjective:    Patient ID: Jay Smith, male    DOB: [birth year], 27 y.o.   MRN: [id number]  Chief Complaint  Patient presents with  . Follow-up    hiv    HPI:  Jay Smith is a 27 y.o. male who presents today for routine follow up of his HIV disease.  Jay Smith was last seen in the office on 06/21/2018 to establish care for newly diagnosed HIV disease with initial viral load of 24,700 and CD4 count of 690.  Genotype was noted to be wild.  Started on [place]. He is due for Prevnar and influenza vaccinations today.  Jay Smith as prescribed with some mild gastrointestinal symptoms for the first few days and has resolved taking it with food. Missed 1-2 doses as he forgot to take them. 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 Denies fevers, chills, night sweats, headaches, changes in vision, neck pain/stiffness, nausea, diarrhea, vomiting, lesions or rashes.   Allergies  Allergen Reactions  . Hydrocodone Swelling      Outpatient Medications Prior to Visit  Medication Sig Dispense Refill  . albuterol (PROVENTIL HFA;VENTOLIN HFA) 108 (90 Base) MCG/ACT inhaler Inhale 2 puffs into the lungs every 6 (six) hours as needed for wheezing or shortness of breath.    . bictegravir-emtricitabine-tenofovir AF (Smith) 50-200-25 MG TABS tablet Take 1 tablet by mouth daily. 30 tablet 2   No facility-administered medications prior to visit.      Past Medical History:  Diagnosis Date  . Asthma   . Eczema   . HIV infection [facility])      Past Surgical History:  Procedure Laterality Date  . COSMETIC SURGERY     Great Toe Surgery     Review of Systems  Constitutional: Negative for appetite change, chills, fatigue, fever and unexpected weight change.  Eyes: Negative for visual disturbance.  Respiratory: Negative for cough, chest tightness, shortness of breath and  wheezing.   Cardiovascular: Negative for chest pain and leg swelling.  Gastrointestinal: Negative for abdominal pain, constipation, diarrhea, nausea and vomiting.  Genitourinary: Negative for dysuria, flank pain, frequency, genital sores, hematuria and urgency.  Skin: Negative for rash.  Allergic/Immunologic: Negative for immunocompromised state.  Neurological: Negative for dizziness and headaches.      Objective:    BP 116/79   Pulse 78   Temp 98.6 F (37 C)   Ht 5\' 7"  (1.702 m)   Wt 165 lb (74.8 kg)   BMI 25.84 kg/m  Nursing note and vital signs reviewed.  Physical Exam  Constitutional: He is oriented to person, place, and time. He appears well-developed. No distress.  HENT:  Mouth/Throat: Oropharynx is clear and moist.  Eyes: Conjunctivae are normal.  Neck: Neck supple.  Cardiovascular: Normal rate, regular rhythm, normal heart sounds and intact distal pulses. Exam reveals no gallop and no friction rub.  No murmur heard. Pulmonary/Chest: Effort normal and breath sounds normal. No respiratory distress. He has no wheezes. He has no rales. He exhibits no tenderness.  Abdominal: Soft. Bowel sounds are normal. There is no tenderness.  Lymphadenopathy:    He has no cervical adenopathy.  Neurological: He is alert and oriented to person, place, and time.  Skin: Skin is warm and dry. No rash noted.  Psychiatric: He has a normal mood and affect. His behavior is normal. Judgment and thought content normal.       Assessment & Plan:  Problem List Items Addressed This Visit      Other   HIV disease (HCC) - Primary    Jay Smith appears to be doing very well with his Smith. He has missed 2 doses this month secondary to simply forgetting to take it. I have provided him with a keychain to keep a dose of medication with him. He has no signs/symptoms of opportunistic infection. Influenza and Prevnar updated today. Check CD4 count and viral load today. Continue current dose of  Smith. Declines condoms. Follow up office visit in 1 month or sooner if needed.       Relevant Orders   Pneumococcal conjugate vaccine 13-valent IM    Other Visit Diagnoses    Need for immunization against influenza       Relevant Orders   Flu Vaccine QUAD 36+ mos IM (Completed)   Need for vaccination against Streptococcus pneumoniae using pneumococcal conjugate vaccine 13       Relevant Orders   Pneumococcal conjugate vaccine 13-valent IM       I am having Jay Smith maintain his albuterol and bictegravir-emtricitabine-tenofovir AF.   No orders of the defined types were placed in this encounter.    Follow-up: Return in about 1 month (around 08/19/2018), or if symptoms worsen or fail to improve.   Marcos Eke, MSN, FNP-C Nurse Practitioner [facility] for Infectious Disease [facility] Office phone: [phone] Pager: [phone] [facility] Main number: [phone]

## 2018-07-19 NOTE — Patient Instructions (Addendum)
Nice see you.   Continue to take your [place] as prescribed.  We will check your blood work today.  Plan to follow up in 1 month or sooner if needed.

## 2018-07-19 NOTE — Assessment & Plan Note (Signed)
Mr. Wimberly appears to be doing very well with his Biktarvy. He has missed 2 doses this month secondary to simply forgetting to take it. I have provided him with a keychain to keep a dose of medication with him. He has no signs/symptoms of opportunistic infection. Influenza and Prevnar updated today. Check CD4 count and viral load today. Continue current dose of Biktarvy. Declines condoms. Follow up office visit in 1 month or sooner if needed.

## 2018-07-20 LAB — T-HELPER CELL (CD4) - (RCID CLINIC ONLY)
CD4 % Helper T Cell: 28 % — ABNORMAL LOW (ref 33–55)
CD4 T Cell Abs: 870 /uL (ref 400–2700)

## 2018-07-23 LAB — HIV-1 RNA QUANT-NO REFLEX-BLD
HIV 1 RNA Quant: 30 copies/mL — ABNORMAL HIGH
HIV-1 RNA Quant, Log: 1.48 Log copies/mL — ABNORMAL HIGH

## 2018-08-06 ENCOUNTER — Emergency Department (HOSPITAL_BASED_OUTPATIENT_CLINIC_OR_DEPARTMENT_OTHER)
Admission: EM | Admit: 2018-08-06 | Discharge: 2018-08-06 | Disposition: A | Discharge status: Home / Self Care | Payer: 59 | Attending: Emergency Medicine | Admitting: Emergency Medicine

## 2018-08-06 ENCOUNTER — Other Ambulatory Visit: Payer: Self-pay

## 2018-08-06 ENCOUNTER — Encounter (HOSPITAL_BASED_OUTPATIENT_CLINIC_OR_DEPARTMENT_OTHER): Payer: Self-pay

## 2018-08-06 ENCOUNTER — Emergency Department (HOSPITAL_BASED_OUTPATIENT_CLINIC_OR_DEPARTMENT_OTHER): Payer: 59

## 2018-08-06 DIAGNOSIS — Z21 Asymptomatic human immunodeficiency virus [HIV] infection status: Secondary | ICD-10-CM | POA: Diagnosis not present

## 2018-08-06 DIAGNOSIS — J039 Acute tonsillitis, unspecified: Secondary | ICD-10-CM | POA: Insufficient documentation

## 2018-08-06 DIAGNOSIS — Z79899 Other long term (current) drug therapy: Secondary | ICD-10-CM | POA: Insufficient documentation

## 2018-08-06 DIAGNOSIS — J40 Bronchitis, not specified as acute or chronic: Secondary | ICD-10-CM | POA: Insufficient documentation

## 2018-08-06 DIAGNOSIS — R502 Drug induced fever: Secondary | ICD-10-CM | POA: Diagnosis present

## 2018-08-06 LAB — GROUP A STREP BY PCR: Group A Strep by PCR: NOT DETECTED

## 2018-08-06 MED ORDER — IBUPROFEN 600 MG PO TABS: 600.0000 mg | ORAL_TABLET | Freq: Four times a day (QID) | ORAL | 0 refills | Status: DC | PRN

## 2018-08-06 MED ORDER — ACETAMINOPHEN 325 MG PO TABS
650.0000 mg | ORAL_TABLET | Freq: Once | ORAL | Status: AC | PRN
  Administered 2018-08-06: 650 mg via ORAL
  Filled 2018-08-06: qty 2

## 2018-08-06 MED ORDER — AZITHROMYCIN 250 MG PO TABS
500.0000 mg | ORAL_TABLET | Freq: Once | ORAL | Status: AC
  Administered 2018-08-06: 500 mg via ORAL
  Filled 2018-08-06: qty 2

## 2018-08-06 MED ORDER — KETOROLAC TROMETHAMINE 30 MG/ML IJ SOLN
30.0000 mg | Freq: Once | INTRAMUSCULAR | Status: AC
  Administered 2018-08-06: 30 mg via INTRAMUSCULAR
  Filled 2018-08-06: qty 1

## 2018-08-06 MED ORDER — AZITHROMYCIN 250 MG PO TABS: 250.0000 mg | ORAL_TABLET | Freq: Every day | ORAL | 0 refills | Status: DC

## 2018-08-06 NOTE — ED Triage Notes (Signed)
C/o flu like sx x 1 week-NAD-steady gait 

## 2018-08-06 NOTE — ED Provider Notes (Signed)
[facility] EMERGENCY DEPARTMENT Provider Note   CSN: [id number] Arrival date & time: 08/06/18  2052     History   Chief Complaint Chief Complaint  Patient presents with  . Cough    HPI Jay Smith is a 27 y.o. male.  Pt presents to the ED today with fever and cough.  The pt said he's had sx for the past week, but they have gotten worse today.  He also has a sore throat.  The pt has not taken any meds for sx.  Pt has a hx of recently diagnosed (9/19) HIV.  He has been seen at the ID clinic and has been compliant with his meds.  He had a CD4 count of 870 and HIV viral load of 30 on 07/19/18.     Past Medical History:  Diagnosis Date  . Asthma   . Eczema   . HIV infection [facility])     Patient Active Problem List   Diagnosis Date Noted  . HIV disease (HCC) 06/22/2018  . Low back pain 09/06/2016    Past Surgical History:  Procedure Laterality Date  . COSMETIC SURGERY     Great Toe Surgery        Home Medications    Prior to Admission medications   Medication Sig Start Date End Date Taking? Authorizing Provider  albuterol (PROVENTIL HFA;VENTOLIN HFA) 108 (90 Base) MCG/ACT inhaler Inhale 2 puffs into the lungs every 6 (six) hours as needed for wheezing or shortness of breath.    [provider]  azithromycin (ZITHROMAX) 250 MG tablet Take 1 tablet (250 mg total) by mouth daily. Take first 2 tablets together, then 1 every day until finished. 08/07/18   Jacalyn Lefevre, MD  bictegravir-emtricitabine-tenofovir AF (BIKTARVY) 50-200-25 MG TABS tablet Take 1 tablet by mouth daily. 06/21/18   Kuppelweiser, Cassie L, RPH-CPP  ibuprofen (ADVIL,MOTRIN) 600 MG tablet Take 1 tablet (600 mg total) by mouth every 6 (six) hours as needed. 08/06/18   Jacalyn Lefevre, MD    Family History Family History  Problem Relation Age of Onset  . Diabetes Brother   . Diabetes Maternal Grandmother   . Hypertension Maternal Grandmother   . Diabetes Maternal  Grandfather   . Hypertension Maternal Grandfather   . Heart disease Maternal Grandfather   . Diabetes Paternal Grandfather   . Hypertension Paternal Grandfather     Social History Social History   Tobacco Use  . Smoking status: Never Smoker  . Smokeless tobacco: Never Used  Substance Use Topics  . Alcohol use: Yes    Alcohol/week: 0.0 standard drinks    Comment: rare  . Drug use: No     Allergies   Hydrocodone   Review of Systems Review of Systems  Constitutional: Positive for fever.  HENT: Positive for sore throat.   Respiratory: Positive for cough.   All other systems reviewed and are negative.    Physical Exam Updated Vital Signs BP 116/73 (BP Location: Right Arm)   Pulse (!) 108   Temp (!) 100.5 F (38.1 C) (Oral)   Resp 18   Ht 5\' 7"  (1.702 m)   Wt 68 kg   SpO2 99%   BMI 23.49 kg/m   Physical Exam  Constitutional: He is oriented to person, place, and time. He appears well-developed and well-nourished.  HENT:  Head: Normocephalic and atraumatic.  Right Ear: External ear normal.  Left Ear: External ear normal.  Nose: Nose normal.  Mouth/Throat: Posterior oropharyngeal erythema present.  Eyes:  Pupils are equal, round, and reactive to light. Conjunctivae and EOM are normal.  Neck: Normal range of motion. Neck supple.  Cardiovascular: Normal rate, regular rhythm, normal heart sounds and intact distal pulses.  Pulmonary/Chest: Effort normal and breath sounds normal.  Abdominal: Soft. Bowel sounds are normal.  Musculoskeletal: Normal range of motion.  Neurological: He is alert and oriented to person, place, and time.  Skin: Skin is warm. Capillary refill takes less than 2 seconds.  Psychiatric: He has a normal mood and affect. His behavior is normal. Judgment and thought content normal.  Nursing note and vitals reviewed.    ED Treatments / Results  Labs (all labs ordered are listed, but only abnormal results are displayed) Labs Reviewed  GROUP A  STREP BY PCR    EKG None  Radiology Dg Chest 2 View  Result Date: 08/06/2018 CLINICAL DATA:  Cough x1 week with fever, nausea and vomiting. EXAM: CHEST - 2 VIEW COMPARISON:  08/22/2018 FINDINGS: The heart size and mediastinal contours are within normal limits. Both lungs are clear. The visualized skeletal structures are unremarkable. IMPRESSION: No active cardiopulmonary disease. Electronically Signed   By: Tollie Eth M.D.   On: 08/06/2018 23:14    Procedures Procedures (including critical care time)  Medications Ordered in ED Medications  acetaminophen (TYLENOL) tablet 650 mg (650 mg Oral Given 08/06/18 2110)  ketorolac (TORADOL) 30 MG/ML injection 30 mg (30 mg Intramuscular Given 08/06/18 2224)  azithromycin (ZITHROMAX) tablet 500 mg (500 mg Oral Given 08/06/18 2322)     Initial Impression / Assessment and Plan / ED Course  I have reviewed the triage vital signs and the nursing notes.  Pertinent labs & imaging results that were available during my care of the patient were reviewed by me and considered in my medical decision making (see chart for details).    Pt's sx will be going on for 1 week.  CD4 counts are good. The pt will be started on zithromax.  He knows to return if worse.  F/u with ID.  Final Clinical Impressions(s) / ED Diagnoses   Final diagnoses:  Tonsillitis  Bronchitis    ED Discharge Orders         Ordered    azithromycin (ZITHROMAX) 250 MG tablet  Daily     08/06/18 2322    ibuprofen (ADVIL,MOTRIN) 600 MG tablet  Every 6 hours PRN     08/06/18 2322           Jacalyn Lefevre, MD 08/06/18 2325

## 2018-08-06 NOTE — ED Notes (Signed)
C/o sorethroat, cough, aches and pains  Fever  2-3 days

## 2018-08-16 MED FILL — BIKTARVY 50-200-25 MG TABS: 50-200-25 | 30 days supply | Qty: 30 | Fill #2

## 2018-08-17 ENCOUNTER — Ambulatory Visit: Payer: Managed Care, Other (non HMO) | Admitting: Family

## 2018-09-04 ENCOUNTER — Other Ambulatory Visit: Payer: Self-pay | Admitting: Pharmacist

## 2018-09-04 DIAGNOSIS — B20 Human immunodeficiency virus [HIV] disease: Secondary | ICD-10-CM

## 2018-09-05 ENCOUNTER — Ambulatory Visit: Payer: 59 | Admitting: Family

## 2018-09-05 DIAGNOSIS — Z0000 Encounter for general adult medical examination without abnormal findings: Secondary | ICD-10-CM | POA: Insufficient documentation

## 2018-09-05 NOTE — Progress Notes (Deleted)
Subjective:    Patient ID: Jay Smith, male    DOB: November 02, 1990, 27 y.o.   MRN: [id number]  No chief complaint on file.    HPI:  Jay Smith is a 27 y.o. male who presents today for routine follow up of HIV disease.  Jay Smith was last seen in the office on 07/19/18 for follow up after newly diagnosed HIV-1 disease with initial viral load of 24,700 and CD4 nadir of 690 and wild-type virus. His viral load improved to 31 with a CD4 count of 870. Health maintenance due includes a dental screening and Menveo.    Allergies  Allergen Reactions  . Hydrocodone Swelling      Outpatient Medications Prior to Visit  Medication Sig Dispense Refill  . albuterol (PROVENTIL HFA;VENTOLIN HFA) 108 (90 Base) MCG/ACT inhaler Inhale 2 puffs into the lungs every 6 (six) hours as needed for wheezing or shortness of breath.    Marland Kitchen. azithromycin (ZITHROMAX) 250 MG tablet Take 1 tablet (250 mg total) by mouth daily. Take first 2 tablets together, then 1 every day until finished. 4 tablet 0  . BIKTARVY 50-200-25 MG TABS tablet TAKE 1 TABLET BY MOUTH DAILY. 30 tablet 0  . ibuprofen (ADVIL,MOTRIN) 600 MG tablet Take 1 tablet (600 mg total) by mouth every 6 (six) hours as needed. 30 tablet 0   No facility-administered medications prior to visit.      Past Medical History:  Diagnosis Date  . Asthma   . Eczema   . HIV infection [facility](HCC)      Past Surgical History:  Procedure Laterality Date  . COSMETIC SURGERY     Great Toe Surgery       Review of Systems  Constitutional: Negative for appetite change, chills, fatigue, fever and unexpected weight change.  Eyes: Negative for visual disturbance.  Respiratory: Negative for cough, chest tightness, shortness of breath and wheezing.   Cardiovascular: Negative for chest pain and leg swelling.  Gastrointestinal: Negative for abdominal pain, constipation, diarrhea, nausea and vomiting.  Genitourinary: Negative for dysuria, flank pain,  frequency, genital sores, hematuria and urgency.  Skin: Negative for rash.  Allergic/Immunologic: Negative for immunocompromised state.  Neurological: Negative for dizziness and headaches.      Objective:    There were no vitals taken for this visit. Nursing note and vital signs reviewed.  Physical Exam  Constitutional: He is oriented to person, place, and time. He appears well-developed. No distress.  HENT:  Mouth/Throat: Oropharynx is clear and moist.  Eyes: Conjunctivae are normal.  Neck: Neck supple.  Cardiovascular: Normal rate, regular rhythm, normal heart sounds and intact distal pulses. Exam reveals no gallop and no friction rub.  No murmur heard. Pulmonary/Chest: Effort normal and breath sounds normal. No respiratory distress. He has no wheezes. He has no rales. He exhibits no tenderness.  Abdominal: Soft. Bowel sounds are normal. There is no tenderness.  Lymphadenopathy:    He has no cervical adenopathy.  Neurological: He is alert and oriented to person, place, and time.  Skin: Skin is warm and dry. No rash noted.  Psychiatric: He has a normal mood and affect. His behavior is normal. Judgment and thought content normal.       Assessment & Plan:   Problem List Items Addressed This Visit    None       I am having Seena H. Wrigley maintain his albuterol, azithromycin, ibuprofen, and BIKTARVY.   No orders of the defined types were placed in this encounter.  Follow-up: No follow-ups on file.   Marcos Eke, MSN, FNP-C Nurse Practitioner [facility] for Infectious Disease [facility] Office phone: [phone] Pager: [phone] [facility] Main number: [phone]

## 2018-09-11 ENCOUNTER — Encounter: Payer: Self-pay | Admitting: Family

## 2018-09-11 ENCOUNTER — Ambulatory Visit (INDEPENDENT_AMBULATORY_CARE_PROVIDER_SITE_OTHER): Payer: 59 | Admitting: Family

## 2018-09-11 VITALS — BP 155/77 | HR 85 | Temp 98.0°F | Ht 67.0 in | Wt 166.0 lb

## 2018-09-11 DIAGNOSIS — B20 Human immunodeficiency virus [HIV] disease: Secondary | ICD-10-CM | POA: Diagnosis not present

## 2018-09-11 DIAGNOSIS — Z0000 Encounter for general adult medical examination without abnormal findings: Secondary | ICD-10-CM | POA: Diagnosis not present

## 2018-09-11 DIAGNOSIS — R030 Elevated blood-pressure reading, without diagnosis of hypertension: Secondary | ICD-10-CM | POA: Diagnosis not present

## 2018-09-11 MED ORDER — BICTEGRAVIR-EMTRICITAB-TENOFOV 50-200-25 MG PO TABS: 1.0000 | ORAL_TABLET | Freq: Every day | ORAL | 2 refills | Status: DC

## 2018-09-11 NOTE — Assessment & Plan Note (Signed)
[name] has well controlled HIV disease with his current regimen of Biktarvy with 2 missed doses and no adverse side effects. He has no signs/symptoms of opportunistic infection or progressive HIV disease. Screenings completed with no social or mental health concerns. Check blood work today. Continue current dose of Biktarvy. Plan for follow up office visit in 3 months or sooner if needed with lab work 1-2 weeks prior to appointment.

## 2018-09-11 NOTE — Assessment & Plan Note (Signed)
   Declines vaccinations today  [facility] for [facility]  Not currently sexually active. Reminded of safe sexual practices and use of protection.

## 2018-09-11 NOTE — Progress Notes (Signed)
Subjective:    Patient ID: Jay Smith, male    DOB: 07/13/1991, 26 y.o.   MRN: [id number]  Chief Complaint  Patient presents with  . Follow-up    HIV     HPI:  Jay Smith is a 27 y.o. male who presents today for routine follow up of HIV disease.  Jay Smith was last seen in the office on 07/19/18 for routine follow with good adherence to his ART regimen of Biktarvy with CD4 count of 870 and viral load of 30. Since that time he was seen in the ED for tonsilitis from which he has recovered without complication. Health maintenance due includes Pneumovax, Menveo, and a dental screening.   Jay Smith has been taking his Biktarvy as prescribed with no adverse side effects. He has missed 2 doses of medication since his most recent office visit. No problems obtaining medication and remains covered through [place] and receives his medications from [employer]. Denies fevers, chills, night sweats, headaches, changes in vision, neck pain/stiffness, nausea, diarrhea, vomiting, lesions or rashes.  He continues to have solid employment with no problems with housing, food or transportation. He is overdue for a dental screening. Not currently sexually active at present.     Allergies  Allergen Reactions  . Hydrocodone Swelling      Outpatient Medications Prior to Visit  Medication Sig Dispense Refill  . albuterol (PROVENTIL HFA;VENTOLIN HFA) 108 (90 Base) MCG/ACT inhaler Inhale 2 puffs into the lungs every 6 (six) hours as needed for wheezing or shortness of breath.    Marland Kitchen BIKTARVY 50-200-25 MG TABS tablet TAKE 1 TABLET BY MOUTH DAILY. 30 tablet 0  . azithromycin (ZITHROMAX) 250 MG tablet Take 1 tablet (250 mg total) by mouth daily. Take first 2 tablets together, then 1 every day until finished. 4 tablet 0  . ibuprofen (ADVIL,MOTRIN) 600 MG tablet Take 1 tablet (600 mg total) by mouth every 6 (six) hours as needed. 30 tablet 0   No facility-administered medications prior to  visit.      Past Medical History:  Diagnosis Date  . Asthma   . Eczema   . HIV infection [facility])      Past Surgical History:  Procedure Laterality Date  . COSMETIC SURGERY     Great Toe Surgery       Review of Systems  Constitutional: Negative for appetite change, chills, fatigue, fever and unexpected weight change.  Eyes: Negative for visual disturbance.  Respiratory: Negative for cough, chest tightness, shortness of breath and wheezing.   Cardiovascular: Negative for chest pain and leg swelling.  Gastrointestinal: Negative for abdominal pain, constipation, diarrhea, nausea and vomiting.  Genitourinary: Negative for dysuria, flank pain, frequency, genital sores, hematuria and urgency.  Skin: Negative for rash.  Allergic/Immunologic: Negative for immunocompromised state.  Neurological: Negative for dizziness and headaches.      Objective:    BP (!) 155/77   Pulse 85   Temp 98 F (36.7 C)   Ht 5\' 7"  (1.702 m)   Wt 166 lb (75.3 kg)   BMI 26.00 kg/m  Nursing note and vital signs reviewed.  Physical Exam  Constitutional: He is oriented to person, place, and time. He appears well-developed. No distress.  HENT:  Mouth/Throat: Oropharynx is clear and moist.  Eyes: Conjunctivae are normal.  Neck: Neck supple.  Cardiovascular: Normal rate, regular rhythm, normal heart sounds and intact distal pulses. Exam reveals no gallop and no friction rub.  No murmur heard. Pulmonary/Chest: Effort normal and  breath sounds normal. No respiratory distress. He has no wheezes. He has no rales. He exhibits no tenderness.  Abdominal: Soft. Bowel sounds are normal. There is no tenderness.  Lymphadenopathy:    He has no cervical adenopathy.  Neurological: He is alert and oriented to person, place, and time.  Skin: Skin is warm and dry. No rash noted.  Psychiatric: He has a normal mood and affect. His behavior is normal. Judgment and thought content normal.     [facility]2018  [facility]  PHQ-2 Total Score  0      BP Readings from Last 3 Encounters:  09/11/18 (!) 155/77  08/06/18 116/73  07/19/18 116/79      Assessment & Plan:   Problem List Items Addressed This Visit      Other   HIV disease (HCC) - Primary    Jay Smith has well controlled HIV disease with his current regimen of Biktarvy with 2 missed doses and no adverse side effects. He has no signs/symptoms of opportunistic infection or progressive HIV disease. Screenings completed with no social or mental health concerns. Check blood work today. Continue current dose of Biktarvy. Plan for follow up office visit in 3 months or sooner if needed with lab work 1-2 weeks prior to appointment.       Relevant Orders   HIV-1 RNA quant-no reflex-bld   Comprehensive metabolic panel   RPR   T-helper cell (CD4)- ([facility] clinic only)   T-helper cell (CD4)- ([facility] clinic only)   HIV-1 RNA quant-no reflex-bld   Comprehensive metabolic panel   RPR   Lipid panel   CBC   Healthcare maintenance     Declines vaccinations today  [facility] for [facility]  Not currently sexually active. Reminded of safe sexual practices and use of protection.       Elevated blood pressure reading    Jay Smith has an elevated blood pressure reading in the setting of no previous history of blood pressure issues. Advised to continue to monitor blood pressure and will recheck at next office visit.          I have discontinued Jay Smith's azithromycin and ibuprofen. I am also having him maintain his albuterol and BIKTARVY.   Follow-up: Return in about 3 months (around 12/11/2018), or if symptoms worsen or fail to improve.   Marcos EkeGreg , MSN, FNP-C Nurse Practitioner [facility] Office phone: [phone] Pager: [phone] [facility] Main number: [phone]

## 2018-09-11 NOTE — Patient Instructions (Signed)
Nice to see you.  We will check your blood work today.  Continue to take your Tom BeanBiktarvy as prescribed.  We will plan to follow up in 3 months or sooner if needed with lab work 1-2 weeks prior to appointment.

## 2018-09-11 NOTE — Assessment & Plan Note (Signed)
[name] has an elevated blood pressure reading in the setting of no previous history of blood pressure issues. Advised to continue to monitor blood pressure and will recheck at next office visit.

## 2018-09-12 MED FILL — BIKTARVY 50-200-25 MG TABS: 50-200-25 | 30 days supply | Qty: 30 | Fill #0

## 2018-09-13 LAB — COMPREHENSIVE METABOLIC PANEL
AG Ratio: 1.3 (calc) (ref 1.0–2.5)
ALT: 11 U/L (ref 9–46)
AST: 16 U/L (ref 10–40)
Albumin: 4.3 g/dL (ref 3.6–5.1)
Alkaline phosphatase (APISO): 59 U/L (ref 40–115)
BUN: 14 mg/dL (ref 7–25)
CO2: 28 mmol/L (ref 20–32)
CREATININE: 1.25 mg/dL (ref 0.60–1.35)
Calcium: 9.6 mg/dL (ref 8.6–10.3)
Chloride: 103 mmol/L (ref 98–110)
GLUCOSE: 100 mg/dL — AB (ref 65–99)
Globulin: 3.4 g/dL (calc) (ref 1.9–3.7)
Potassium: 4 mmol/L (ref 3.5–5.3)
Sodium: 139 mmol/L (ref 135–146)
Total Bilirubin: 0.7 mg/dL (ref 0.2–1.2)
Total Protein: 7.7 g/dL (ref 6.1–8.1)

## 2018-09-13 LAB — HIV-1 RNA QUANT-NO REFLEX-BLD
HIV 1 RNA Quant: <20 copies/mL — AB
HIV-1 RNA Quant, Log: <1.3 Log copies/mL — AB

## 2018-09-13 LAB — T-HELPER CELL (CD4) - (RCID CLINIC ONLY)
CD4 T CELL HELPER: 35 % (ref 33–55)
CD4 T Cell Abs: 860 /uL (ref 400–2700)

## 2018-09-13 LAB — RPR: RPR Ser Ql: NONREACTIVE

## 2018-10-02 ENCOUNTER — Other Ambulatory Visit: Payer: Self-pay | Admitting: Family

## 2018-10-02 DIAGNOSIS — B20 Human immunodeficiency virus [HIV] disease: Secondary | ICD-10-CM

## 2018-10-12 MED FILL — BIKTARVY 50-200-25 MG TABS: 50-200-25 | 30 days supply | Qty: 30 | Fill #0

## 2018-11-12 MED FILL — BIKTARVY 50-200-25 MG TABS: 50-200-25 | 30 days supply | Qty: 30 | Fill #1

## 2018-11-20 MED FILL — BIKTARVY 50-200-25 MG TABS: 50-200-25 | 30 days supply | Qty: 30 | Fill #1

## 2018-11-21 IMAGING — CR DG CHEST 2V
2 series · 2 of 2 positions shown · non-contrast
Comparison: 08/22/2018

CLINICAL DATA: Cough x1 week with fever, nausea and vomiting.

EXAM:
CHEST - 2 VIEW

[w chest pa]
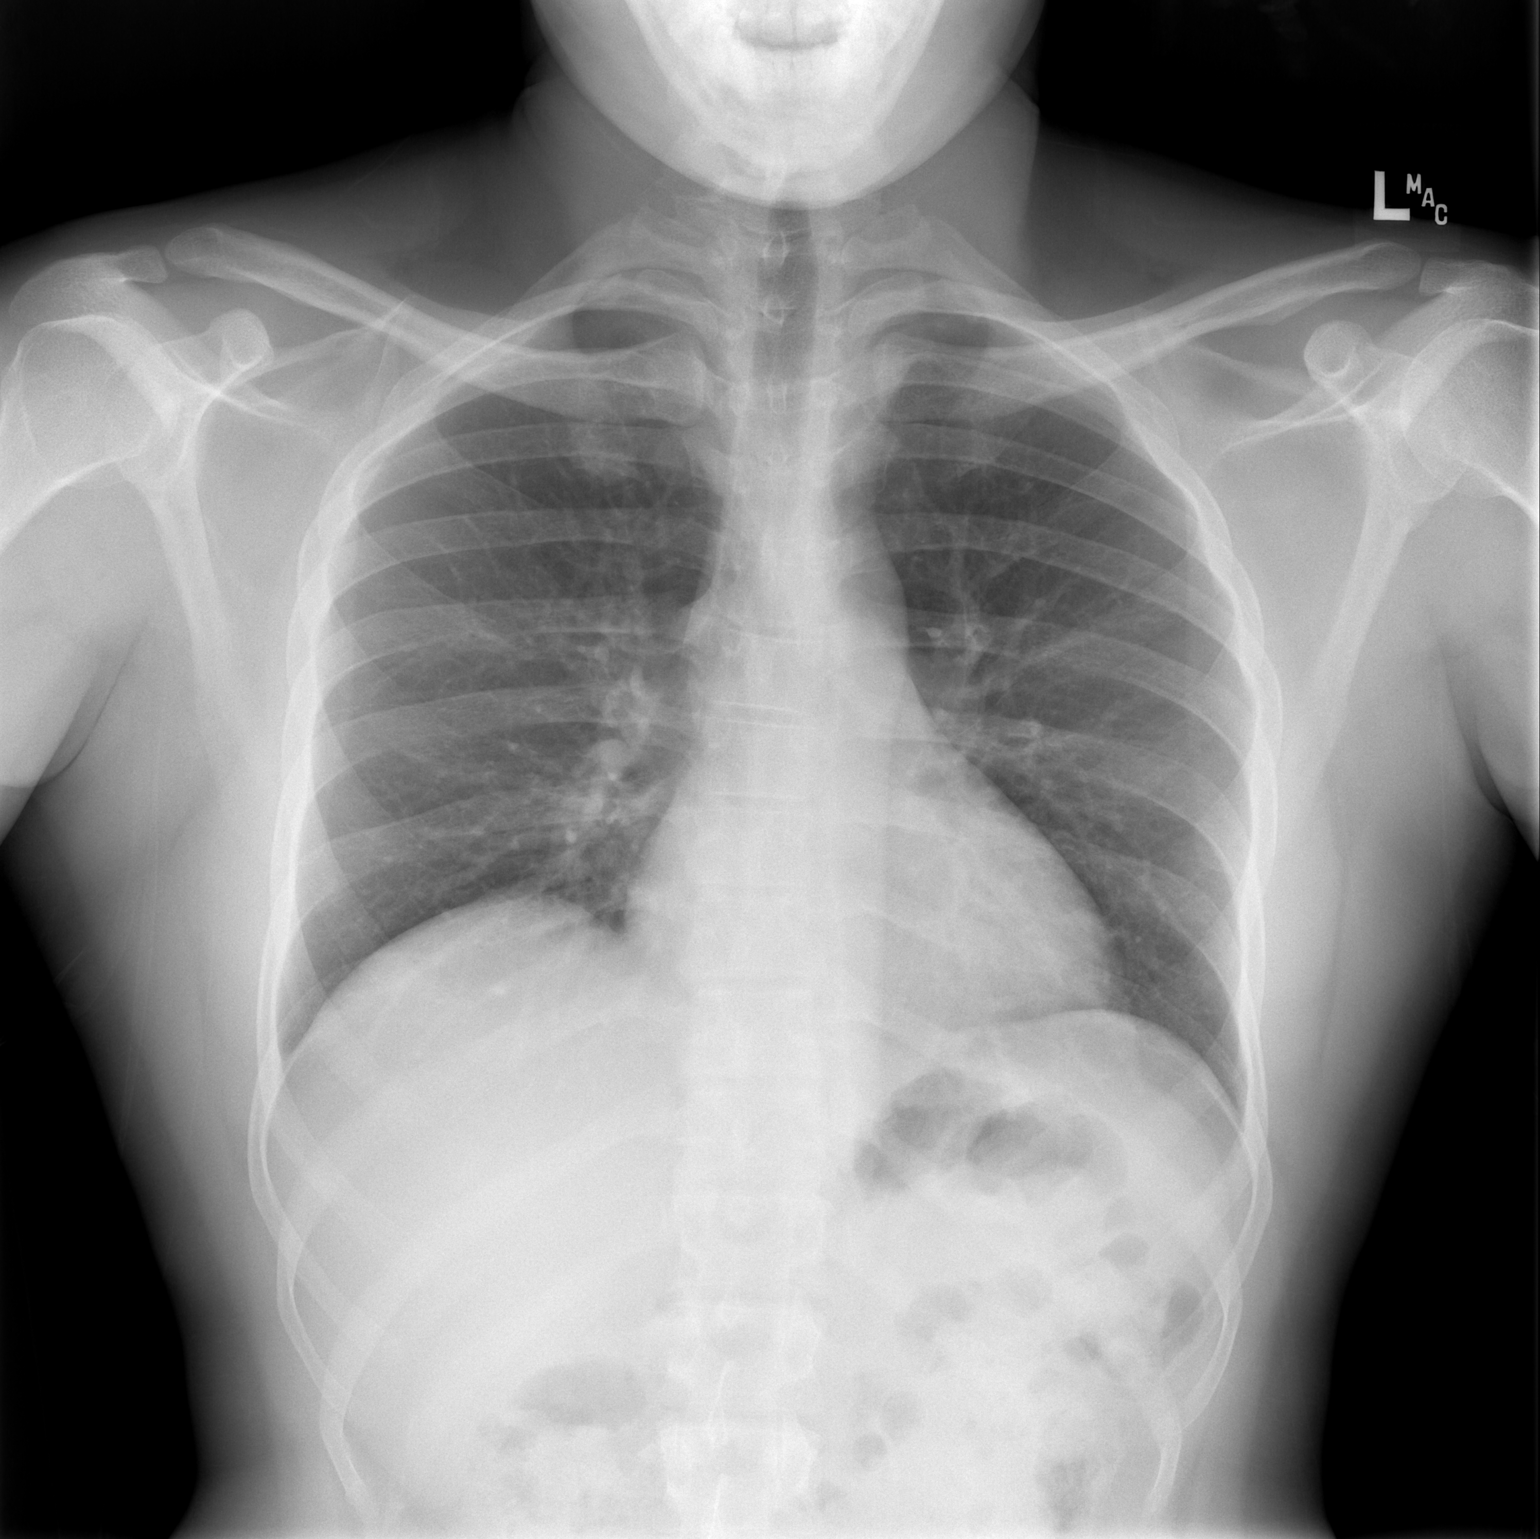

[w chest lat]
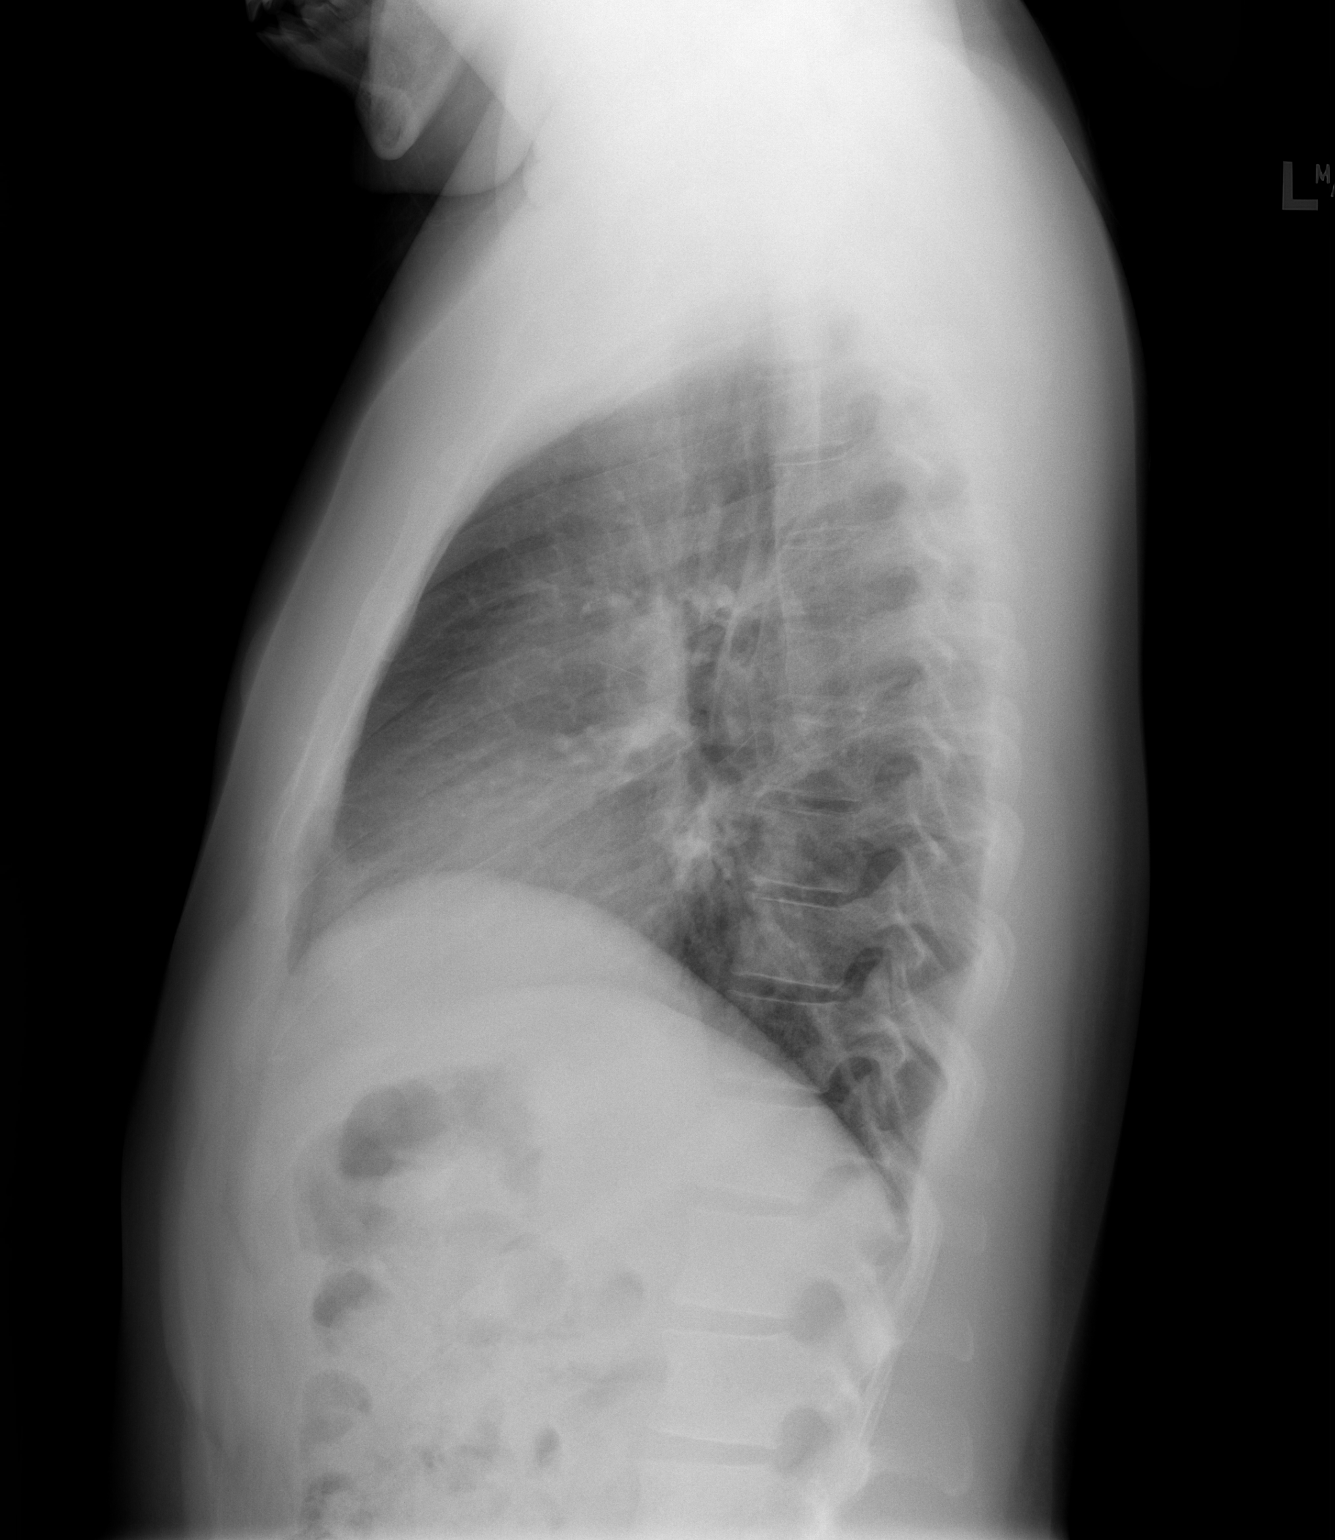

[2 of 2 positions shown; findings below may reference images not displayed]

FINDINGS: The heart size and mediastinal contours are within normal limits.
Both lungs are clear. The visualized skeletal structures are
unremarkable.
IMPRESSION: No active cardiopulmonary disease.

## 2018-12-11 ENCOUNTER — Other Ambulatory Visit: Payer: 59

## 2018-12-11 DIAGNOSIS — B20 Human immunodeficiency virus [HIV] disease: Secondary | ICD-10-CM

## 2018-12-12 LAB — T-HELPER CELL (CD4) - (RCID CLINIC ONLY)
CD4 T CELL HELPER: 35 % (ref 33–55)
CD4 T Cell Abs: 890 /uL (ref 400–2700)

## 2018-12-13 LAB — CBC
HEMATOCRIT: 38.6 % (ref 38.5–50.0)
Hemoglobin: 12.8 g/dL — ABNORMAL LOW (ref 13.2–17.1)
MCH: 25 pg — ABNORMAL LOW (ref 27.0–33.0)
MCHC: 33.2 g/dL (ref 32.0–36.0)
MCV: 75.2 fL — AB (ref 80.0–100.0)
MPV: 10.9 fL (ref 7.5–12.5)
Platelets: 240 10*3/uL (ref 140–400)
RBC: 5.13 10*6/uL (ref 4.20–5.80)
RDW: 15 % (ref 11.0–15.0)
WBC: 4.9 10*3/uL (ref 3.8–10.8)

## 2018-12-13 LAB — COMPREHENSIVE METABOLIC PANEL
AG RATIO: 1.3 (calc) (ref 1.0–2.5)
ALT: 17 U/L (ref 9–46)
AST: 16 U/L (ref 10–40)
Albumin: 4.3 g/dL (ref 3.6–5.1)
Alkaline phosphatase (APISO): 63 U/L (ref 36–130)
BILIRUBIN TOTAL: 0.5 mg/dL (ref 0.2–1.2)
BUN: 15 mg/dL (ref 7–25)
CALCIUM: 9.5 mg/dL (ref 8.6–10.3)
CO2: 25 mmol/L (ref 20–32)
Chloride: 105 mmol/L (ref 98–110)
Creat: 1.21 mg/dL (ref 0.60–1.35)
Globulin: 3.3 g/dL (calc) (ref 1.9–3.7)
Glucose, Bld: 85 mg/dL (ref 65–99)
Potassium: 4.2 mmol/L (ref 3.5–5.3)
Sodium: 139 mmol/L (ref 135–146)
Total Protein: 7.6 g/dL (ref 6.1–8.1)

## 2018-12-13 LAB — LIPID PANEL
CHOL/HDL RATIO: 5.2 (calc) — AB (ref <5.0)
CHOLESTEROL: 199 mg/dL (ref <200)
HDL: 38 mg/dL — AB (ref >=40)
LDL Cholesterol (Calc): 137 mg/dL (calc) — ABNORMAL HIGH
NON-HDL CHOLESTEROL (CALC): 161 mg/dL — AB (ref <130)
Triglycerides: 121 mg/dL (ref <150)

## 2018-12-13 LAB — RPR: RPR Ser Ql: NONREACTIVE

## 2018-12-13 LAB — HIV-1 RNA QUANT-NO REFLEX-BLD
HIV 1 RNA QUANT: NOT DETECTED {copies}/mL
HIV-1 RNA QUANT, LOG: NOT DETECTED {Log_copies}/mL

## 2018-12-21 MED FILL — BIKTARVY 50-200-25 MG TABS: 50-200-25 | 30 days supply | Qty: 30 | Fill #2

## 2018-12-25 ENCOUNTER — Encounter: Payer: 59 | Admitting: Family

## 2018-12-31 ENCOUNTER — Other Ambulatory Visit: Payer: Self-pay

## 2018-12-31 ENCOUNTER — Encounter: Payer: Self-pay | Admitting: Family

## 2018-12-31 ENCOUNTER — Ambulatory Visit (INDEPENDENT_AMBULATORY_CARE_PROVIDER_SITE_OTHER): Payer: 59 | Admitting: Family

## 2018-12-31 VITALS — BP 125/76 | HR 74 | Temp 99.0°F | Wt 172.0 lb

## 2018-12-31 DIAGNOSIS — B20 Human immunodeficiency virus [HIV] disease: Secondary | ICD-10-CM | POA: Diagnosis not present

## 2018-12-31 DIAGNOSIS — R748 Abnormal levels of other serum enzymes: Secondary | ICD-10-CM

## 2018-12-31 DIAGNOSIS — Z0000 Encounter for general adult medical examination without abnormal findings: Secondary | ICD-10-CM

## 2018-12-31 DIAGNOSIS — Z113 Encounter for screening for infections with a predominantly sexual mode of transmission: Secondary | ICD-10-CM

## 2018-12-31 MED ORDER — BICTEGRAVIR-EMTRICITAB-TENOFOV 50-200-25 MG PO TABS: 1.0000 | ORAL_TABLET | Freq: Every day | ORAL | 5 refills | Status: DC

## 2018-12-31 NOTE — Patient Instructions (Signed)
Nice to see you.  Please continue to take your [place] as prescribed.  We will plan to follow up in 4 months or sooner if needed with lab work 1-2 weeks prior to appointment.    Preventing High Cholesterol Cholesterol is a waxy, fat-like substance that your body needs in small amounts. Your liver makes all the cholesterol that your body needs. Having high cholesterol (hypercholesterolemia) increases your risk for heart disease and stroke. Extra (excess) cholesterol comes from the food you eat, such as animal-based fat (saturated fat) from meat and some dairy products. High cholesterol can often be prevented with diet and lifestyle changes. If you already have high cholesterol, you can control it with diet and lifestyle changes, as well as medicine. What nutrition changes can be made?  Avoid trans fats, which are found in margarine and some baked goods.  Avoid foods and beverages that have added sugars.  Eat more fruits, vegetables, and whole grains.  Choose healthy sources of protein, such as fish, poultry, and nuts.  Choose healthy sources of fat, such as: ? Nuts. ? Vegetable oils, especially olive oil. ? Fish that have healthy fats (omega-3 fatty acids), such as mackerel or salmon. What lifestyle changes can be made?   Lose weight if you are overweight. Losing 5-10 lb (2.3-4.5 kg) can help prevent or control high cholesterol and reduce your risk for diabetes and high blood pressure. Ask your health care provider to help you with a diet and exercise plan to safely lose weight.  Get enough exercise. Do at least 150 minutes of moderate-intensity exercise each week. ? You could do this in short exercise sessions several times a day, or you could do longer exercise sessions a few times a week. For example, you could take a brisk 10-minute walk or bike ride, 3 times a day, for 5 days a week.  Do not smoke. If you need help quitting, ask your health care provider.  Limit your alcohol  intake. If you drink alcohol, limit alcohol intake to no more than 1 drink a day for nonpregnant women and 2 drinks a day for men. One drink equals 12 oz of beer, 5 oz of wine, or 1 oz of hard liquor. Why are these changes important?  If you have high cholesterol, deposits (plaques) may build up on the walls of your blood vessels. Plaques make the arteries narrower and stiffer, which can restrict or block blood flow and cause blood clots to form. This greatly increases your risk for heart attack and stroke. Making diet and lifestyle changes can reduce your risk for these life-threatening conditions. What can I do to lower my risk?  Manage your risk factors for high cholesterol. Talk with your health care provider about all of your risk factors and how to lower your risk.  Manage other conditions that you have, such as diabetes or high blood pressure (hypertension).  Have your cholesterol checked at regular intervals.  Keep all follow-up visits as told by your health care provider. This is important. How is this treated? In addition to diet and lifestyle changes, your health care provider may recommend medicines to help lower cholesterol, such as a medicine to reduce the amount of cholesterol made in your liver. You may need medicine if:  Diet and lifestyle changes do not lower your cholesterol enough.  You have high cholesterol and other risk factors for heart disease or stroke. Take over-the-counter and prescription medicines only as told by your health care provider. Where to  find more information  American Heart Association: 1122334455.jsp  National Heart, Lung, and Blood Institute: http://hood.com/ Summary  High cholesterol increases your risk for heart disease and stroke. By keeping your cholesterol level low, you can reduce your risk for these conditions.   Diet and lifestyle changes are the most important steps in preventing high cholesterol.  Work with your health care provider to manage your risk factors, and have your blood tested regularly. This information is not intended to replace advice given to you by your health care provider. Make sure you discuss any questions you have with your health care provider. Document Released: 10/11/2015 Document Revised: 06/04/2016 Document Reviewed: 06/04/2016 Elsevier Interactive Patient Education  2019 [employer].

## 2018-12-31 NOTE — Assessment & Plan Note (Signed)
Jay Smith has low HDL which is likely related to carbohydrate and processed/sugary foods. Not currently at risk for cardiovascular disease, however may starting to become insulin resistant. Information provided in AVS.

## 2018-12-31 NOTE — Assessment & Plan Note (Signed)
Mr. Feeney has well controlled HIV disease with good adherence and tolerance to his ART regimen of Biktarvy. He has no signs/symptoms or opportunistic infection or progressive HIV disease. Continues to remain covered through [employer]. Continue current dose of Biktarvy. Plan to follow up office visit in 4 months or sooner if needed with lab work 1-2 weeks prior to office visit.

## 2018-12-31 NOTE — Progress Notes (Signed)
Subjective:    Patient ID: Jay Smith, male    DOB: 1991/05/13, 28 y.o.   MRN: [id number]  Chief Complaint  Patient presents with  . HIV Positive/AIDS     HPI:  Jay Smith is a 28 y.o. male who presents today for routine follow up of HIV disease.  Jay Smith was last seen in the office on 09/11/18 for routine follow up with good adherence and tolerance to his ART regimen of Biktarvy. Viral load at that time was undetectable and CD4 count was 860. Most recent blood work completed on 12/11/2018 with a viral load that remains undetectable and CD4 count of 890. Kidney function, liver function and electrolytes are within normal ranges. LDL was elevated at 137 and HDL low at 38. Healthcare maintenance due includes Pneumovax and Menveo.   Jay Smith has been taking his Biktarvy as prescribed and denies adverse side effects or missed doses. Overall he feels well today. Denies fevers, chills, night sweats, headaches, changes in vision, neck pain/stiffness, nausea, diarrhea, vomiting, lesions or rashes.  Jay Smith continues to remain covered [employer] and has no problems obtaining his medication from [facility]. He continues to work in the [employer], political party and has stable housing. He is sexually active and uses condoms. Denies recreational or illicit drug use. Has not had feelings of being down, depressed or hopeless in the last 2 weeks.    Allergies  Allergen Reactions  . Hydrocodone Swelling      Outpatient Medications Prior to Visit  Medication Sig Dispense Refill  . albuterol (PROVENTIL HFA;VENTOLIN HFA) 108 (90 Base) MCG/ACT inhaler Inhale 2 puffs into the lungs every 6 (six) hours as needed for wheezing or shortness of breath.    Marland Kitchen BIKTARVY 50-200-25 MG TABS tablet TAKE 1 TABLET BY MOUTH DAILY. 30 tablet 3   No facility-administered medications prior to visit.      Past Medical History:  Diagnosis Date  . Asthma   . Eczema   . HIV  infection [facility])      Past Surgical History:  Procedure Laterality Date  . COSMETIC SURGERY     Great Toe Surgery       Review of Systems  Constitutional: Negative for appetite change, chills, fatigue, fever and unexpected weight change.  Eyes: Negative for visual disturbance.  Respiratory: Negative for cough, chest tightness, shortness of breath and wheezing.   Cardiovascular: Negative for chest pain and leg swelling.  Gastrointestinal: Negative for abdominal pain, constipation, diarrhea, nausea and vomiting.  Genitourinary: Negative for dysuria, flank pain, frequency, genital sores, hematuria and urgency.  Skin: Negative for rash.  Allergic/Immunologic: Negative for immunocompromised state.  Neurological: Negative for dizziness and headaches.      Objective:    BP 125/76   Pulse 74   Temp 99 F (37.2 C)   Wt 172 lb (78 kg)   BMI 26.94 kg/m  Nursing note and vital signs reviewed.  Physical Exam Constitutional:      General: He is not in acute distress.    Appearance: He is well-developed.  Eyes:     Conjunctiva/sclera: Conjunctivae normal.  Neck:     Musculoskeletal: Neck supple.  Cardiovascular:     Rate and Rhythm: Normal rate and regular rhythm.     Heart sounds: Normal heart sounds. No murmur. No friction rub. No gallop.   Pulmonary:     Effort: Pulmonary effort is normal. No respiratory distress.     Breath sounds: Normal breath sounds. No  wheezing or rales.  Chest:     Chest wall: No tenderness.  Abdominal:     General: Bowel sounds are normal.     Palpations: Abdomen is soft.     Tenderness: There is no abdominal tenderness.  Lymphadenopathy:     Cervical: No cervical adenopathy.  Skin:    General: Skin is warm and dry.     Findings: No rash.  Neurological:     Mental Status: He is alert.  Psychiatric:        Mood and Affect: Mood normal.        Assessment & Plan:   Problem List Items Addressed This Visit      Other   HIV disease (HCC)  - Primary    Jay Smith has well controlled HIV disease with good adherence and tolerance to his ART regimen of Biktarvy. He has no signs/symptoms or opportunistic infection or progressive HIV disease. Continues to remain covered through [employer]. Continue current dose of Biktarvy. Plan to follow up office visit in 4 months or sooner if needed with lab work 1-2 weeks prior to office visit.       Relevant Medications   bictegravir-emtricitabine-tenofovir AF (BIKTARVY) 50-200-25 MG TABS tablet   Other Relevant Orders   T-helper cell (CD4)- ([facility] clinic only)   HIV-1 RNA quant-no reflex-bld   CBC   Comprehensive metabolic panel   Healthcare maintenance     Declines vaccinations today.  Remains sexually active with recommendations for safe sexual practice to reduce risk of acquisition or transmission of STI. Condoms provided. Declines additional testing for gonorrhea and chlamydia.   Awaiting dental screening from referral.       Low serum HDL    Jay Smith has low HDL which is likely related to carbohydrate and processed/sugary foods. Not currently at risk for cardiovascular disease, however may starting to become insulin resistant. Information provided in AVS.       Relevant Orders   Lipid panel    Other Visit Diagnoses    Screening for STDs (sexually transmitted diseases)       Relevant Orders   RPR       I have changed Jay Smith's Biktarvy to bictegravir-emtricitabine-tenofovir AF. I am also having him maintain his albuterol.   Meds ordered this encounter  Medications  . bictegravir-emtricitabine-tenofovir AF (BIKTARVY) 50-200-25 MG TABS tablet    Sig: Take 1 tablet by mouth daily.    Dispense:  30 tablet    Refill:  5    Order Specific Question:   Supervising Provider    Answer:   Judyann Munson [[id number]]     Follow-up: Return in about 4 months (around 05/02/2019), or if symptoms worsen or fail to improve.   Jay Eke, MSN, FNP-C Nurse Practitioner  [facility] for Infectious Disease [facility] Office phone: [phone] Pager: [phone] [facility] Main number: [phone]

## 2018-12-31 NOTE — Assessment & Plan Note (Signed)
   Declines vaccinations today.  Remains sexually active with recommendations for safe sexual practice to reduce risk of acquisition or transmission of STI. Condoms provided. Declines additional testing for gonorrhea and chlamydia.   Awaiting dental screening from referral.

## 2019-01-16 MED FILL — BIKTARVY 50-200-25 MG TABS: 50-200-25 | 30 days supply | Qty: 30 | Fill #3

## 2019-02-11 MED FILL — BIKTARVY 50-200-25 MG TABS: 50-200-25 | 30 days supply | Qty: 30 | Fill #0

## 2019-02-26 MED FILL — BIKTARVY 50-200-25 MG TABS: 50-200-25 | 30 days supply | Qty: 30 | Fill #0

## 2019-03-26 MED FILL — BIKTARVY 50-200-25 MG TABS: 50-200-25 | 30 days supply | Qty: 30 | Fill #1

## 2019-04-18 ENCOUNTER — Other Ambulatory Visit: Payer: 59

## 2019-04-18 ENCOUNTER — Other Ambulatory Visit: Payer: Self-pay

## 2019-04-18 DIAGNOSIS — R748 Abnormal levels of other serum enzymes: Secondary | ICD-10-CM

## 2019-04-18 DIAGNOSIS — B20 Human immunodeficiency virus [HIV] disease: Secondary | ICD-10-CM

## 2019-04-18 DIAGNOSIS — Z113 Encounter for screening for infections with a predominantly sexual mode of transmission: Secondary | ICD-10-CM

## 2019-04-19 LAB — T-HELPER CELL (CD4) - (RCID CLINIC ONLY)
CD4 % Helper T Cell: 41 % (ref 33–65)
CD4 T Cell Abs: 967 /uL (ref 400–1790)

## 2019-04-25 LAB — COMPREHENSIVE METABOLIC PANEL
AG Ratio: 1.4 (calc) (ref 1.0–2.5)
ALT: 15 U/L (ref 9–46)
AST: 16 U/L (ref 10–40)
Albumin: 4.4 g/dL (ref 3.6–5.1)
Alkaline phosphatase (APISO): 70 U/L (ref 36–130)
BUN: 12 mg/dL (ref 7–25)
CO2: 26 mmol/L (ref 20–32)
Calcium: 9.6 mg/dL (ref 8.6–10.3)
Chloride: 101 mmol/L (ref 98–110)
Creat: 1.18 mg/dL (ref 0.60–1.35)
Globulin: 3.1 g/dL (calc) (ref 1.9–3.7)
Glucose, Bld: 133 mg/dL — ABNORMAL HIGH (ref 65–99)
Potassium: 3.9 mmol/L (ref 3.5–5.3)
Sodium: 137 mmol/L (ref 135–146)
Total Bilirubin: 0.4 mg/dL (ref 0.2–1.2)
Total Protein: 7.5 g/dL (ref 6.1–8.1)

## 2019-04-25 LAB — HIV-1 RNA QUANT-NO REFLEX-BLD
HIV 1 RNA Quant: <20 copies/mL
HIV-1 RNA Quant, Log: <1.3 Log copies/mL

## 2019-04-25 LAB — CBC
HCT: 40.4 % (ref 38.5–50.0)
Hemoglobin: 13.3 g/dL (ref 13.2–17.1)
MCH: 25.1 pg — ABNORMAL LOW (ref 27.0–33.0)
MCHC: 32.9 g/dL (ref 32.0–36.0)
MCV: 76.2 fL — ABNORMAL LOW (ref 80.0–100.0)
MPV: 10.7 fL (ref 7.5–12.5)
Platelets: 267 10*3/uL (ref 140–400)
RBC: 5.3 10*6/uL (ref 4.20–5.80)
RDW: 15.1 % — ABNORMAL HIGH (ref 11.0–15.0)
WBC: 5.2 10*3/uL (ref 3.8–10.8)

## 2019-04-25 LAB — LIPID PANEL
Cholesterol: 193 mg/dL (ref <200)
HDL: 31 mg/dL — ABNORMAL LOW (ref >=40)
LDL Cholesterol (Calc): 124 mg/dL (calc) — ABNORMAL HIGH
Non-HDL Cholesterol (Calc): 162 mg/dL (calc) — ABNORMAL HIGH (ref <130)
Total CHOL/HDL Ratio: 6.2 (calc) — ABNORMAL HIGH (ref <5.0)
Triglycerides: 234 mg/dL — ABNORMAL HIGH (ref <150)

## 2019-04-25 LAB — RPR: RPR Ser Ql: NONREACTIVE

## 2019-04-25 MED FILL — BIKTARVY 50-200-25 MG TABS: 50-200-25 | 30 days supply | Qty: 30 | Fill #2

## 2019-05-13 ENCOUNTER — Other Ambulatory Visit: Payer: Self-pay

## 2019-05-13 ENCOUNTER — Encounter: Payer: Self-pay | Admitting: Family

## 2019-05-13 ENCOUNTER — Ambulatory Visit (INDEPENDENT_AMBULATORY_CARE_PROVIDER_SITE_OTHER): Payer: Managed Care, Other (non HMO) | Admitting: Family

## 2019-05-13 VITALS — BP 119/70 | HR 92 | Temp 98.5°F

## 2019-05-13 DIAGNOSIS — B20 Human immunodeficiency virus [HIV] disease: Secondary | ICD-10-CM | POA: Diagnosis not present

## 2019-05-13 DIAGNOSIS — Z23 Encounter for immunization: Secondary | ICD-10-CM

## 2019-05-13 DIAGNOSIS — Z0000 Encounter for general adult medical examination without abnormal findings: Secondary | ICD-10-CM

## 2019-05-13 DIAGNOSIS — Z113 Encounter for screening for infections with a predominantly sexual mode of transmission: Secondary | ICD-10-CM

## 2019-05-13 MED ORDER — BIKTARVY 50-200-25 MG PO TABS: 1.0000 | ORAL_TABLET | Freq: Every day | ORAL | 5 refills | Status: DC

## 2019-05-13 MED ORDER — ALBUTEROL SULFATE HFA 108 (90 BASE) MCG/ACT IN AERS: 2.0000 | INHALATION_SPRAY | Freq: Four times a day (QID) | RESPIRATORY_TRACT | 2 refills | Status: DC | PRN

## 2019-05-13 MED FILL — ALBUTEROL SULFATE HFA 108 (: 108 (90 BAS | 25 days supply | Qty: 18 | Fill #0

## 2019-05-13 NOTE — Progress Notes (Signed)
Subjective:    Patient ID: Jay Smith, male    DOB: 09/18/1991, 28 y.o.   MRN: [id number]  Chief Complaint  Patient presents with  . HIV Positive/AIDS     HPI:  Jay Smith is a 28 y.o. male with HIV disease last seen in the office on 12/31/2018 with well-controlled HIV disease and good adherence and tolerance to his ART regimen of Biktarvy.  Most recent blood work completed on 04/18/2019 with a viral load that remains undetectable and CD4 count of 967.  Kidney function, liver function, and electrolytes within normal ranges.  Cholesterol elevated with triglycerides of 234, LDL 124, and HDL of 31.  Healthcare maintenance due includes Pneumovax, Menveo, and tetanus.  Jay Smith continues to take his Biktarvy as prescribed with no adverse side effects or missed doses.  Overall feeling very well today with no concerns or complaints. Denies fevers, chills, night sweats, headaches, changes in vision, neck pain/stiffness, nausea, diarrhea, vomiting, lesions or rashes.  Jay Smith has no problems obtaining his medication from the pharmacy and remains covered through [place].  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  Denies feelings of being down, depressed, or hopeless recently.  No recreational or illicit drug use, tobacco use, and minimal alcohol consumption presently.  Currently sexually active and requesting condoms.  He does use condoms when he is sexually active.   Allergies  Allergen Reactions  . Hydrocodone Swelling      Outpatient Medications Prior to Visit  Medication Sig Dispense Refill  . albuterol (PROVENTIL HFA;VENTOLIN HFA) 108 (90 Base) MCG/ACT inhaler Inhale 2 puffs into the lungs every 6 (six) hours as needed for wheezing or shortness of breath.    . bictegravir-emtricitabine-tenofovir AF (BIKTARVY) 50-200-25 MG TABS tablet Take 1 tablet by mouth daily. 30 tablet 5   No facility-administered medications prior to visit.      Past  Medical History:  Diagnosis Date  . Asthma   . Eczema   . HIV infection [facility])      Past Surgical History:  Procedure Laterality Date  . COSMETIC SURGERY     Great Toe Surgery     Review of Systems  Constitutional: Negative for appetite change, chills, fatigue, fever and unexpected weight change.  Eyes: Negative for visual disturbance.  Respiratory: Negative for cough, chest tightness, shortness of breath and wheezing.   Cardiovascular: Negative for chest pain and leg swelling.  Gastrointestinal: Negative for abdominal pain, constipation, diarrhea, nausea and vomiting.  Genitourinary: Negative for dysuria, flank pain, frequency, genital sores, hematuria and urgency.  Skin: Negative for rash.  Allergic/Immunologic: Negative for immunocompromised state.  Neurological: Negative for dizziness and headaches.      Objective:    BP 119/70   Pulse 92   Temp 98.5 F (36.9 C)  Nursing note and vital signs reviewed.  Physical Exam Constitutional:      General: He is not in acute distress.    Appearance: He is well-developed.  Eyes:     Conjunctiva/sclera: Conjunctivae normal.  Neck:     Musculoskeletal: Neck supple.  Cardiovascular:     Rate and Rhythm: Normal rate and regular rhythm.     Heart sounds: Normal heart sounds. No murmur. No friction rub. No gallop.   Pulmonary:     Effort: Pulmonary effort is normal. No respiratory distress.     Breath sounds: Normal breath sounds. No wheezing or rales.  Chest:     Chest wall: No tenderness.  Abdominal:     General: Bowel sounds are normal.     Palpations: Abdomen is soft.     Tenderness: There is no abdominal tenderness.  Lymphadenopathy:     Cervical: No cervical adenopathy.  Skin:    General: Skin is warm and dry.     Findings: No rash.  Neurological:     Mental Status: He is alert and oriented to person, place, and time.  Psychiatric:        Behavior: Behavior normal.        Thought Content: Thought content normal.         Judgment: Judgment normal.      Depression screen [facility] 12/31/2018 09/11/2018 07/19/2018 06/21/2018 01/20/2018  Decreased Interest 0 0 0 0 0  Down, Depressed, Hopeless 0 0 0 0 0  PHQ - 2 Score 0 0 0 0 0       Assessment & Plan:   Problem List Items Addressed This Visit      Other   HIV disease [facility](HCC)    Jay Smith has well-controlled HIV disease with good adherence and tolerance to his ART regimen of Biktarvy.  No signs/symptoms of opportunistic infection or progressive HIV disease at present.  Has no problems obtaining his medication from the pharmacy.  Continue current dose of Biktarvy.  He has now been undetectable for over 1 year.  Plan for follow-up in 6 months or sooner if needed with lab work 1 to 2 weeks prior to appointment.      Relevant Medications   bictegravir-emtricitabine-tenofovir AF (BIKTARVY) 50-200-25 MG TABS tablet   Other Relevant Orders   Tdap vaccine greater than or equal to 7yo IM (Completed)   T-helper cell (CD4)- ([facility] clinic only)   HIV-1 RNA quant-no reflex-bld   CBC   Comprehensive metabolic panel   Healthcare maintenance     Tetanus updated today.  Declines Pneumovax.  Due for Pneumovax and Menveo at next office visit.  Discussed importance of safe sexual practice to reduce risk of acquisition/transmission of STI.  Condoms provided per request.       Other Visit Diagnoses    Need for diphtheria-tetanus-pertussis (Tdap) vaccine    -  Primary   Relevant Orders   Tdap vaccine greater than or equal to 7yo IM (Completed)   Screening for STDs (sexually transmitted diseases)       Relevant Orders   RPR       I have changed Jay Smith's albuterol. I am also having him maintain his Biktarvy.   Meds ordered this encounter  Medications  . bictegravir-emtricitabine-tenofovir AF (BIKTARVY) 50-200-25 MG TABS tablet    Sig: Take 1 tablet by mouth daily.    Dispense:  30 tablet    Refill:  5    Order Specific Question:    Supervising Provider    Answer:   [name], [name] [[id number]]  . albuterol (VENTOLIN HFA) 108 (90 Base) MCG/ACT inhaler    Sig: Inhale 2 puffs into the lungs every 6 (six) hours as needed for wheezing or shortness of breath.    Dispense:  18 g    Refill:  2    Order Specific Question:   Supervising Provider    Answer:   [name], [name] [[id number]]     Follow-up: Return in about 6 months (around 11/13/2019), or if symptoms worsen or fail to improve.   Jay EkeGreg Calone, MSN, FNP-C Nurse Practitioner [facility] Main number: [phone]

## 2019-05-13 NOTE — Assessment & Plan Note (Signed)
Mr. Rigel has well-controlled HIV disease with good adherence and tolerance to his ART regimen of Biktarvy.  No signs/symptoms of opportunistic infection or progressive HIV disease at present.  Has no problems obtaining his medication from the pharmacy.  Continue current dose of Biktarvy.  He has now been undetectable for over 1 year.  Plan for follow-up in 6 months or sooner if needed with lab work 1 to 2 weeks prior to appointment.

## 2019-05-13 NOTE — Assessment & Plan Note (Signed)
   Tetanus updated today.  Declines Pneumovax.  Due for Pneumovax and Menveo at next office visit.  Discussed importance of safe sexual practice to reduce risk of acquisition/transmission of STI.  Condoms provided per request.

## 2019-05-13 NOTE — Patient Instructions (Signed)
Nice to see you.  Please continue take your [place] as prescribed daily.  Refills have been sent to the pharmacy.  Please schedule time for your flu shot in September.  Plan for follow-up in 6 months or sooner if needed with lab work 1 to 2 weeks prior to your appointment.  Have a great day and stay safe!

## 2019-05-30 MED FILL — BIKTARVY 50-200-25 MG TABS: 50-200-25 | 30 days supply | Qty: 30 | Fill #0

## 2019-06-06 MED FILL — ALBUTEROL SULFATE HFA 108 (: 108 (90 BAS | 25 days supply | Qty: 18 | Fill #0

## 2019-06-24 MED FILL — BIKTARVY 50-200-25 MG TABS: 50-200-25 | 30 days supply | Qty: 30 | Fill #1

## 2019-07-23 MED FILL — BIKTARVY 50-200-25 MG TABS: 50-200-25 | 30 days supply | Qty: 30 | Fill #2

## 2019-08-02 MED FILL — BIKTARVY 50-200-25 MG TABS: 50-200-25 | 30 days supply | Qty: 30 | Fill #2

## 2019-10-22 ENCOUNTER — Telehealth: Payer: Self-pay | Admitting: Pharmacy Technician

## 2019-10-22 MED FILL — BIKTARVY 50-200-25 MG TABS: 50-200-25 | 30 days supply | Qty: 30 | Fill #4

## 2019-10-22 NOTE — Telephone Encounter (Addendum)
[facility] Patient Advocate Encounter  Completed and sent [employer] Advancing Access application for Biktarvy for this patient who is uninsured.    Patient is approved 10/22/2019 through 10/21/2020.  BIN      [id number] PCN    [id number] GRP    [id number] ID        [id number]   Kathie Rhodes E. Dimas Aguas CPhT Specialty Pharmacy Patient [facility] for Infectious Disease Phone: [phone] Fax:  [phone]

## 2019-10-24 ENCOUNTER — Other Ambulatory Visit: Payer: Self-pay

## 2019-10-24 DIAGNOSIS — Z23 Encounter for immunization: Secondary | ICD-10-CM

## 2019-10-24 NOTE — Progress Notes (Signed)
Entered in error

## 2019-11-13 ENCOUNTER — Other Ambulatory Visit: Payer: Self-pay

## 2019-11-13 DIAGNOSIS — B20 Human immunodeficiency virus [HIV] disease: Secondary | ICD-10-CM

## 2019-11-13 DIAGNOSIS — Z113 Encounter for screening for infections with a predominantly sexual mode of transmission: Secondary | ICD-10-CM

## 2019-11-14 LAB — T-HELPER CELL (CD4) - (RCID CLINIC ONLY)
CD4 % Helper T Cell: 37 % (ref 33–65)
CD4 T Cell Abs: 871 /uL (ref 400–1790)

## 2019-11-15 LAB — COMPREHENSIVE METABOLIC PANEL
AG Ratio: 1.5 (calc) (ref 1.0–2.5)
ALT: 26 U/L (ref 9–46)
AST: 23 U/L (ref 10–40)
Albumin: 4.5 g/dL (ref 3.6–5.1)
Alkaline phosphatase (APISO): 60 U/L (ref 36–130)
BUN: 14 mg/dL (ref 7–25)
CO2: 24 mmol/L (ref 20–32)
Calcium: 9.4 mg/dL (ref 8.6–10.3)
Chloride: 107 mmol/L (ref 98–110)
Creat: 1.24 mg/dL (ref 0.60–1.35)
Globulin: 3 g/dL (calc) (ref 1.9–3.7)
Glucose, Bld: 102 mg/dL — ABNORMAL HIGH (ref 65–99)
Potassium: 4.2 mmol/L (ref 3.5–5.3)
Sodium: 139 mmol/L (ref 135–146)
Total Bilirubin: 0.4 mg/dL (ref 0.2–1.2)
Total Protein: 7.5 g/dL (ref 6.1–8.1)

## 2019-11-15 LAB — CBC
HCT: 39.5 % (ref 38.5–50.0)
Hemoglobin: 13 g/dL — ABNORMAL LOW (ref 13.2–17.1)
MCH: 25.1 pg — ABNORMAL LOW (ref 27.0–33.0)
MCHC: 32.9 g/dL (ref 32.0–36.0)
MCV: 76.3 fL — ABNORMAL LOW (ref 80.0–100.0)
MPV: 11.4 fL (ref 7.5–12.5)
Platelets: 259 10*3/uL (ref 140–400)
RBC: 5.18 10*6/uL (ref 4.20–5.80)
RDW: 15.2 % — ABNORMAL HIGH (ref 11.0–15.0)
WBC: 4.6 10*3/uL (ref 3.8–10.8)

## 2019-11-15 LAB — HIV-1 RNA QUANT-NO REFLEX-BLD
HIV 1 RNA Quant: 51 copies/mL — ABNORMAL HIGH
HIV-1 RNA Quant, Log: 1.71 Log copies/mL — ABNORMAL HIGH

## 2019-11-15 LAB — RPR: RPR Ser Ql: NONREACTIVE

## 2019-11-15 MED FILL — BIKTARVY 50-200-25 MG TABS: 50-200-25 | 30 days supply | Qty: 30 | Fill #5

## 2019-12-03 ENCOUNTER — Telehealth: Payer: Self-pay

## 2019-12-03 NOTE — Telephone Encounter (Signed)
COVID-19 Pre-Screening Questions:12/03/19   Do you currently have a fever (>100 F), chills or unexplained body aches? NO   Are you currently experiencing new cough, shortness of breath, sore throat, runny nose?NO  .  Have you recently travelled outside the state of [place] in the last 14 days? NO  .  Have you been in contact with someone that is currently pending confirmation of Covid19 testing or has been confirmed to have the Covid19 virus?  NO   **If the patient answers NO to ALL questions -  advise the patient to please call the clinic before coming to the office should any symptoms develop.    

## 2019-12-04 ENCOUNTER — Other Ambulatory Visit: Payer: Self-pay

## 2019-12-04 ENCOUNTER — Ambulatory Visit (INDEPENDENT_AMBULATORY_CARE_PROVIDER_SITE_OTHER): Payer: Self-pay | Admitting: Family

## 2019-12-04 ENCOUNTER — Encounter: Payer: Self-pay | Admitting: Family

## 2019-12-04 VITALS — BP 123/75 | HR 83 | Temp 98.6°F | Ht 67.0 in | Wt 171.0 lb

## 2019-12-04 DIAGNOSIS — B20 Human immunodeficiency virus [HIV] disease: Secondary | ICD-10-CM

## 2019-12-04 DIAGNOSIS — Z0000 Encounter for general adult medical examination without abnormal findings: Secondary | ICD-10-CM

## 2019-12-04 MED ORDER — BIKTARVY 50-200-25 MG PO TABS: 1.0000 | ORAL_TABLET | Freq: Every day | ORAL | 5 refills | Status: DC

## 2019-12-04 NOTE — Assessment & Plan Note (Signed)
[name] continues to have well-controlled HIV disease with good adherence and tolerance to his ART regimen of Biktarvy.  No signs/symptoms of opportunistic infection or progressive HIV disease.  We reviewed his lab work and discussed the plan of care.  We also discussed the possibilities of injectable medications which he is interested in. He met with the pharmacy staff to complete the paperwork to initiate transition to [facility] with 30 day lead in with oral cabitegravir and rilpvirine. He will continue Biktarvy until oral medication is available. Following oral medication will transition to monthly injections of Canenuva. Plan for close follow up with transition of medication.

## 2019-12-04 NOTE — Progress Notes (Signed)
Subjective:    Patient ID: Jay Smith, male    DOB: 1991-07-22, 29 y.o.   MRN: [id number]  Chief Complaint  Patient presents with  . Follow-up    B20 6 month f/u     HPI:  Jay Smith is a 29 y.o. male with HIV disease who was last seen in the office on 05/13/2019 with good adherence and tolerance to his ART regimen of Biktarvy.  CD4 count at the time was 967 with a viral load that was undetectable.  Most recent blood work completed on 11/13/2019 with viral load of 51 and CD4 count of 871.  Healthcare maintenance due includes influenza vaccination.  Jay Smith continues to take his Biktarvy as prescribed with no adverse side effects or missed doses.  Overall feeling well today with no new concerns/complaints.  He does mention his weight which has been stable over the last year and is actually down 1 pound from previous. Denies fevers, chills, night sweats, headaches, changes in vision, neck pain/stiffness, nausea, diarrhea, vomiting, lesions or rashes.  Jay Smith has no problems obtaining his medication from the pharmacy and has advancing access through January 2022.  Denies feelings of being down, depressed, or hopeless recently.  No tobacco use, alcohol consumption, or recreational or illicit drug use.  Continues to work full-time.  He is sexually active and requesting condoms.    Allergies  Allergen Reactions  . Hydrocodone Swelling      Outpatient Medications Prior to Visit  Medication Sig Dispense Refill  . albuterol (VENTOLIN HFA) 108 (90 Base) MCG/ACT inhaler Inhale 2 puffs into the lungs every 6 (six) hours as needed for wheezing or shortness of breath. 18 g 2  . bictegravir-emtricitabine-tenofovir AF (BIKTARVY) 50-200-25 MG TABS tablet Take 1 tablet by mouth daily. 30 tablet 5   No facility-administered medications prior to visit.     Past Medical History:  Diagnosis Date  . Asthma   . Eczema   . HIV infection [facility])      Past Surgical History:    Procedure Laterality Date  . COSMETIC SURGERY     Great Toe Surgery       Review of Systems  Constitutional: Negative for appetite change, chills, fatigue, fever and unexpected weight change.  Eyes: Negative for visual disturbance.  Respiratory: Negative for cough, chest tightness, shortness of breath and wheezing.   Cardiovascular: Negative for chest pain and leg swelling.  Gastrointestinal: Negative for abdominal pain, constipation, diarrhea, nausea and vomiting.  Genitourinary: Negative for dysuria, flank pain, frequency, genital sores, hematuria and urgency.  Skin: Negative for rash.  Allergic/Immunologic: Negative for immunocompromised state.  Neurological: Negative for dizziness and headaches.      Objective:    BP 123/75   Pulse 83   Temp 98.6 F (37 C)   Ht _0  (1.702 m)   Wt 171 lb (77.6 kg)   SpO2 96%   BMI 26.78 kg/m  Nursing note and vital signs reviewed.  Physical Exam Constitutional:      General: He is not in acute distress.    Appearance: He is well-developed.  Eyes:     Conjunctiva/sclera: Conjunctivae normal.  Cardiovascular:     Rate and Rhythm: Normal rate and regular rhythm.     Heart sounds: Normal heart sounds. No murmur. No friction rub. No gallop.   Pulmonary:     Effort: Pulmonary effort is normal. No respiratory distress.     Breath sounds: Normal breath sounds. No wheezing or  rales.  Chest:     Chest wall: No tenderness.  Abdominal:     General: Bowel sounds are normal.     Palpations: Abdomen is soft.     Tenderness: There is no abdominal tenderness.  Musculoskeletal:     Cervical back: Neck supple.  Lymphadenopathy:     Cervical: No cervical adenopathy.  Skin:    General: Skin is warm and dry.     Findings: No rash.  Neurological:     Mental Status: He is alert and oriented to person, place, and time.  Psychiatric:        Behavior: Behavior normal.        Thought Content: Thought content normal.        Judgment: Judgment  normal.      Depression screen [facility] 2/9 12/31/2018 09/11/2018 07/19/2018 06/21/2018 01/20/2018  Decreased Interest 0 0 0 0 0  Down, Depressed, Hopeless 0 0 0 0 0  PHQ - 2 Score 0 0 0 0 0       Assessment & Plan:    Patient Active Problem List   Diagnosis Date Noted  . Low serum HDL 12/31/2018  . Elevated blood pressure reading 09/11/2018  . Healthcare maintenance 09/05/2018  . HIV disease ([place]) 06/22/2018  . Low back pain 09/06/2016     Problem List Items Addressed This Visit      Other   HIV disease [facility])    Jay Smith continues to have well-controlled HIV disease with good adherence and tolerance to his ART regimen of Biktarvy.  No signs/symptoms of opportunistic infection or progressive HIV disease.  We reviewed his lab work and discussed the plan of care.  We also discussed the possibilities of injectable medications which he is interested in. He met with the pharmacy staff to complete the paperwork to initiate transition to [facility] with 30 day lead in with oral cabitegravir and rilpvirine. He will continue Biktarvy until oral medication is available. Following oral medication will transition to monthly injections of Canenuva. Plan for close follow up with transition of medication.       Relevant Medications   bictegravir-emtricitabine-tenofovir AF (BIKTARVY) 50-200-25 MG TABS tablet   Healthcare maintenance     Declines vaccination today.  Discussed importance of safe sexual practice to reduce risk of STI. Condoms provided.          I am having Jay Smith maintain his albuterol and Biktarvy.   Meds ordered this encounter  Medications  . bictegravir-emtricitabine-tenofovir AF (BIKTARVY) 50-200-25 MG TABS tablet    Sig: Take 1 tablet by mouth daily.    Dispense:  30 tablet    Refill:  5    Order Specific Question:   Supervising Provider    Answer:   Carlyle Basques [[id number]]     Follow-up: Return in about 3 months (around 03/02/2020).   Terri Piedra,  MSN, FNP-C Nurse Practitioner [facility] for Infectious Disease [place] number: [phone]

## 2019-12-04 NOTE — Patient Instructions (Signed)
Nice to see you.  We will work on transition to [place] with oral medication first.  Continue to take your Biktarvy in the meantime.  Plan for follow up in 1 month after starting new medication.  Have a great day and stay safe!

## 2019-12-04 NOTE — Assessment & Plan Note (Signed)
   Declines vaccination today.  Discussed importance of safe sexual practice to reduce risk of STI. Condoms provided.

## 2019-12-12 MED FILL — BIKTARVY 50-200-25 MG TABS: 50-200-25 | 30 days supply | Qty: 30 | Fill #0

## 2019-12-23 ENCOUNTER — Telehealth: Payer: Self-pay | Admitting: Pharmacist

## 2019-12-23 NOTE — Telephone Encounter (Signed)
Faxed completed Cabenuva application to [employer]. Patient has RW/ADAP. Will alert Tammy Sours once determination is made.

## 2019-12-30 ENCOUNTER — Telehealth: Payer: Self-pay | Admitting: Pharmacy Technician

## 2019-12-30 ENCOUNTER — Encounter: Payer: Self-pay | Admitting: Family

## 2019-12-30 NOTE — Telephone Encounter (Signed)
[facility] Patient Advocate Encounter  Application for Jay Smith was faxed to [facility] for processing. They are unable to complete the benefits investigation component which is required to proceed to the next steps. [employer] will not release patient information to 3rd party without a formal consent from the patient. I spoke with the patient and will meet with him tomorrow at his appointment to provide the information necessary and phone number to confirm [employer] compliance with [employer]. Their number is [phone]

## 2019-12-31 ENCOUNTER — Encounter: Payer: Self-pay | Admitting: Family

## 2019-12-31 ENCOUNTER — Ambulatory Visit (INDEPENDENT_AMBULATORY_CARE_PROVIDER_SITE_OTHER): Payer: Self-pay | Admitting: Family

## 2019-12-31 ENCOUNTER — Other Ambulatory Visit: Payer: Self-pay

## 2019-12-31 VITALS — BP 120/62 | HR 86 | Temp 98.6°F | Ht 67.0 in | Wt 170.0 lb

## 2019-12-31 DIAGNOSIS — B20 Human immunodeficiency virus [HIV] disease: Secondary | ICD-10-CM

## 2019-12-31 NOTE — Patient Instructions (Signed)
Nice to see you.  Continue to take your [place] as prescribed.   We will keep you posted about the Cabenuva.   Plan for follow up in 4 months or sooner if needed.  Have a great day and stay safe!

## 2019-12-31 NOTE — Telephone Encounter (Signed)
Jay Smith. He is coming to see you today. Thank you!

## 2019-12-31 NOTE — Assessment & Plan Note (Signed)
Jay Smith continues to take his Biktarvy as prescribed with no adverse side effects or missed doses since his last office visit.  No signs/symptoms of opportunistic infection or progressive HIV disease.  Reviewed his previous lab work and discussed the plan of care.  Injectable medications currently in process through manufacture and negotiations.  Continue current dose of Biktarvy.  Plan for follow-up in 4 months or sooner if needed with lab work 1 to 2 weeks prior to appointment or on same day.

## 2019-12-31 NOTE — Progress Notes (Signed)
Subjective:    Patient ID: Jay Smith, male    DOB: 12-03-1990, 29 y.o.   MRN: [id number]  Chief Complaint  Patient presents with  . HIV Positive/AIDS     HPI:  Jay Smith is a 29 y.o. male with HIV disease who was last seen in the office on 12/04/2019 with good adherence and tolerance to his ART regimen of Biktarvy.  Blood work at the time showed a viral load of 51 with CD4 count of 871.  We had discussed injectable medications with application provided to start oral therapy to lead into injectable therapy.  Jay Smith continues to take his Biktarvy as prescribed with no adverse side effects or missed doses since his last office visit.  Overall feeling well today with no new concerns/complaints. Denies fevers, chills, night sweats, headaches, changes in vision, neck pain/stiffness, nausea, diarrhea, vomiting, lesions or rashes.  Jay Smith has no problems obtaining his medication from the pharmacy and remains covered through [employer].  Denies any feelings of being down, depressed, or hopeless recently.  No recreational or illicit drug use, tobacco use, or alcohol consumption.   Allergies  Allergen Reactions  . Hydrocodone Swelling      Outpatient Medications Prior to Visit  Medication Sig Dispense Refill  . albuterol (VENTOLIN HFA) 108 (90 Base) MCG/ACT inhaler Inhale 2 puffs into the lungs every 6 (six) hours as needed for wheezing or shortness of breath. 18 g 2  . bictegravir-emtricitabine-tenofovir AF (BIKTARVY) 50-200-25 MG TABS tablet Take 1 tablet by mouth daily. 30 tablet 5   No facility-administered medications prior to visit.     Past Medical History:  Diagnosis Date  . Asthma   . Eczema   . HIV infection [facility])      Past Surgical History:  Procedure Laterality Date  . COSMETIC SURGERY     Great Toe Surgery       Review of Systems  Constitutional: Negative for appetite change, chills, fatigue, fever and unexpected weight change.  Eyes:  Negative for visual disturbance.  Respiratory: Negative for cough, chest tightness, shortness of breath and wheezing.   Cardiovascular: Negative for chest pain and leg swelling.  Gastrointestinal: Negative for abdominal pain, constipation, diarrhea, nausea and vomiting.  Genitourinary: Negative for dysuria, flank pain, frequency, genital sores, hematuria and urgency.  Skin: Negative for rash.  Allergic/Immunologic: Negative for immunocompromised state.  Neurological: Negative for dizziness and headaches.      Objective:    BP 120/62   Pulse 86   Temp 98.6 F (37 C)   Ht 5\' 7"  (1.702 m)   Wt 170 lb (77.1 kg)   SpO2 94%   BMI 26.63 kg/m  Nursing note and vital signs reviewed.  Physical Exam Constitutional:      General: He is not in acute distress.    Appearance: He is well-developed.  Eyes:     Conjunctiva/sclera: Conjunctivae normal.  Cardiovascular:     Rate and Rhythm: Normal rate and regular rhythm.     Heart sounds: Normal heart sounds. No murmur. No friction rub. No gallop.   Pulmonary:     Effort: Pulmonary effort is normal. No respiratory distress.     Breath sounds: Normal breath sounds. No wheezing or rales.  Chest:     Chest wall: No tenderness.  Abdominal:     General: Bowel sounds are normal.     Palpations: Abdomen is soft.     Tenderness: There is no abdominal tenderness.  Musculoskeletal:  Cervical back: Neck supple.  Lymphadenopathy:     Cervical: No cervical adenopathy.  Skin:    General: Skin is warm and dry.     Findings: No rash.  Neurological:     Mental Status: He is alert and oriented to person, place, and time.  Psychiatric:        Behavior: Behavior normal.        Thought Content: Thought content normal.        Judgment: Judgment normal.      Depression screen [facility] 2/9 12/31/2018 09/11/2018 07/19/2018 06/21/2018 01/20/2018  Decreased Interest 0 0 0 0 0  Down, Depressed, Hopeless 0 0 0 0 0  PHQ - 2 Score 0 0 0 0 0       Assessment  & Plan:    Patient Active Problem List   Diagnosis Date Noted  . Low serum HDL 12/31/2018  . Elevated blood pressure reading 09/11/2018  . Healthcare maintenance 09/05/2018  . HIV disease (HCC) 06/22/2018  . Low back pain 09/06/2016     Problem List Items Addressed This Visit      Other   HIV disease (HCC) - Primary    Jay Smith continues to take his Biktarvy as prescribed with no adverse side effects or missed doses since his last office visit.  No signs/symptoms of opportunistic infection or progressive HIV disease.  Reviewed his previous lab work and discussed the plan of care.  Injectable medications currently in process through manufacture and negotiations.  Continue current dose of Biktarvy.  Plan for follow-up in 4 months or sooner if needed with lab work 1 to 2 weeks prior to appointment or on same day.          I am having Jay Smith maintain his albuterol and Biktarvy.   Follow-up: Return in about 4 months (around 05/01/2020), or if symptoms worsen or fail to improve.   Marcos Eke, MSN, FNP-C Nurse Practitioner [facility] for Infectious Disease [facility] Main number: [phone]

## 2020-01-02 MED FILL — BIKTARVY 50-200-25 MG TABS: 50-200-25 | 30 days supply | Qty: 30 | Fill #0

## 2020-01-28 MED FILL — BIKTARVY 50-200-25 MG TABS: 50-200-25 | 30 days supply | Qty: 30 | Fill #1

## 2020-02-28 MED FILL — BIKTARVY 50-200-25 MG TABS: 50-200-25 | 30 days supply | Qty: 30 | Fill #2

## 2020-03-13 ENCOUNTER — Telehealth: Payer: Self-pay | Admitting: Pharmacy Technician

## 2020-03-13 NOTE — Telephone Encounter (Signed)
[facility] Patient Advocate Encounter  [facility] is the contracted pharmacy for [employer] and will be shipping the patient's oral lead in medication to the clinic. The pharmacist at their location confirmed signature date on the application and will process today.

## 2020-03-26 MED FILL — BIKTARVY 50-200-25 MG TABS: 50-200-25 | 30 days supply | Qty: 30 | Fill #3

## 2020-04-13 MED FILL — BIKTARVY 50-200-25 MG TABS: 50-200-25 | 30 days supply | Qty: 30 | Fill #3

## 2020-04-27 ENCOUNTER — Other Ambulatory Visit: Payer: Self-pay | Admitting: Pharmacist

## 2020-04-27 DIAGNOSIS — B20 Human immunodeficiency virus [HIV] disease: Secondary | ICD-10-CM

## 2020-04-27 MED ORDER — BIKTARVY 50-200-25 MG PO TABS: 1.0000 | ORAL_TABLET | Freq: Every day | ORAL | 5 refills | Status: DC

## 2020-04-27 NOTE — Progress Notes (Signed)
Patient is approved for [employer]. Engineer, agricultural to [employer] on [address]. Kathie Rhodes will call patient and coordinate.

## 2020-05-01 ENCOUNTER — Encounter: Payer: Self-pay | Admitting: Family

## 2020-05-01 ENCOUNTER — Other Ambulatory Visit: Payer: Self-pay

## 2020-05-01 ENCOUNTER — Ambulatory Visit (INDEPENDENT_AMBULATORY_CARE_PROVIDER_SITE_OTHER): Payer: Self-pay | Admitting: Family

## 2020-05-01 VITALS — BP 107/70 | HR 65 | Temp 98.5°F | Wt 167.0 lb

## 2020-05-01 DIAGNOSIS — B20 Human immunodeficiency virus [HIV] disease: Secondary | ICD-10-CM

## 2020-05-01 DIAGNOSIS — Z0000 Encounter for general adult medical examination without abnormal findings: Secondary | ICD-10-CM

## 2020-05-01 LAB — T-HELPER CELL (CD4) - (RCID CLINIC ONLY)
CD4 % Helper T Cell: 36 % (ref 33–65)
CD4 T Cell Abs: 926 /uL (ref 400–1790)

## 2020-05-01 MED ORDER — BIKTARVY 50-200-25 MG PO TABS: 1.0000 | ORAL_TABLET | Freq: Every day | ORAL | 5 refills | Status: DC

## 2020-05-01 NOTE — Assessment & Plan Note (Signed)
   Discussed importance of safe sexual practice to reduce risk of STI.  Condoms declined.  Covid vaccination up-to-date per recommendations.  Awaiting dental referral previously placed.

## 2020-05-01 NOTE — Progress Notes (Signed)
Subjective:    Patient ID: Jay Smith, male    DOB: 12/09/1990, 29 y.o.   MRN: [id number]  Chief Complaint  Patient presents with  . Follow-up    B20     HPI:  Jay Smith is a 29 y.o. male with HIV disease who was last seen in the office on 12/31/2019 with good adherence and tolerance to his ART regimen of Biktarvy. Viral load at the time was undetectable with CD4 count of 871. Here today for routine follow-up.  Jay Smith continues to take his Susanne Borders daily as prescribed with no adverse side effects or missed doses since his last office visit.  Overall feeling well today with no new concerns/complaints. Denies fevers, chills, night sweats, headaches, changes in vision, neck pain/stiffness, nausea, diarrhea, vomiting, lesions or rashes.  Jay Smith has no problems obtaining his medication from the pharmacy and remains covered through [employer] with plans to renew financial assistance today.  Continues to smoke marijuana on occasion with no tobacco use and alcohol consumption is rare.  Not currently sexually active and declines condoms.  Actively seeking work at the present time.  Has received the Moderna vaccination.   Allergies  Allergen Reactions  . Hydrocodone Swelling      Outpatient Medications Prior to Visit  Medication Sig Dispense Refill  . albuterol (VENTOLIN HFA) 108 (90 Base) MCG/ACT inhaler Inhale 2 puffs into the lungs every 6 (six) hours as needed for wheezing or shortness of breath. 18 g 2  . bictegravir-emtricitabine-tenofovir AF (BIKTARVY) 50-200-25 MG TABS tablet Take 1 tablet by mouth daily. 30 tablet 5   No facility-administered medications prior to visit.     Past Medical History:  Diagnosis Date  . Asthma   . Eczema   . HIV infection [facility])      Past Surgical History:  Procedure Laterality Date  . COSMETIC SURGERY     Great Toe Surgery     Review of Systems  Constitutional: Negative for appetite change, chills, fatigue, fever  and unexpected weight change.  Eyes: Negative for visual disturbance.  Respiratory: Negative for cough, chest tightness, shortness of breath and wheezing.   Cardiovascular: Negative for chest pain and leg swelling.  Gastrointestinal: Negative for abdominal pain, constipation, diarrhea, nausea and vomiting.  Genitourinary: Negative for dysuria, flank pain, frequency, genital sores, hematuria and urgency.  Skin: Negative for rash.  Allergic/Immunologic: Negative for immunocompromised state.  Neurological: Negative for dizziness and headaches.      Objective:    BP 107/70   Pulse 65   Temp 98.5 F (36.9 C) (Oral)   Wt 167 lb (75.8 kg)   BMI 26.16 kg/m  Nursing note and vital signs reviewed.  Physical Exam Constitutional:      General: He is not in acute distress.    Appearance: He is well-developed.  Eyes:     Conjunctiva/sclera: Conjunctivae normal.  Cardiovascular:     Rate and Rhythm: Normal rate and regular rhythm.     Heart sounds: Normal heart sounds. No murmur heard.  No friction rub. No gallop.   Pulmonary:     Effort: Pulmonary effort is normal. No respiratory distress.     Breath sounds: Normal breath sounds. No wheezing or rales.  Chest:     Chest wall: No tenderness.  Abdominal:     General: Bowel sounds are normal.     Palpations: Abdomen is soft.     Tenderness: There is no abdominal tenderness.  Musculoskeletal:  Cervical back: Neck supple.  Lymphadenopathy:     Cervical: No cervical adenopathy.  Skin:    General: Skin is warm and dry.     Findings: No rash.  Neurological:     Mental Status: He is alert and oriented to person, place, and time.  Psychiatric:        Behavior: Behavior normal.        Thought Content: Thought content normal.        Judgment: Judgment normal.      Depression screen [facility] 2/9 05/01/2020 12/31/2018 09/11/2018 07/19/2018 06/21/2018  Decreased Interest 0 0 0 0 0  Down, Depressed, Hopeless 0 0 0 0 0  PHQ - 2 Score 0 0 0 0 0        Assessment & Plan:    Patient Active Problem List   Diagnosis Date Noted  . Low serum HDL 12/31/2018  . Elevated blood pressure reading 09/11/2018  . Healthcare maintenance 09/05/2018  . HIV disease (HCC) 06/22/2018  . Low back pain 09/06/2016     Problem List Items Addressed This Visit      Other   HIV disease (HCC) - Primary    Jay Smith is a 29 year old male with well-controlled HIV-1 disease obtained from MSM exposure.  No current signs/symptoms of opportunistic infection or progressive HIV disease.  Reviewed previous lab work and discussed the plan of care.  Renew financial assistance today.  Continue current dose of Biktarvy.  Plan for follow-up in 4 months or sooner if needed.      Relevant Medications   bictegravir-emtricitabine-tenofovir AF (BIKTARVY) 50-200-25 MG TABS tablet   Other Relevant Orders   COMPLETE METABOLIC PANEL WITH GFR   HIV-1 RNA quant-no reflex-bld   T-helper cell (CD4)- ([facility] clinic only)   Healthcare maintenance     Discussed importance of safe sexual practice to reduce risk of STI.  Condoms declined.  Covid vaccination up-to-date per recommendations.  Awaiting dental referral previously placed.          I am having Jay Smith maintain his albuterol and Biktarvy.   Meds ordered this encounter  Medications  . bictegravir-emtricitabine-tenofovir AF (BIKTARVY) 50-200-25 MG TABS tablet    Sig: Take 1 tablet by mouth daily.    Dispense:  30 tablet    Refill:  5    Order Specific Question:   Supervising Provider    Answer:   Judyann Munson [[id number]]     Follow-up: Return in about 4 months (around 09/01/2020), or if symptoms worsen or fail to improve.   Marcos Eke, MSN, FNP-C Nurse Practitioner [facility] for Infectious Disease [facility] Main number: [phone]

## 2020-05-01 NOTE — Patient Instructions (Addendum)
Nice to see you.  Continue to take your [place] daily as prescribed.  Refills will be sent to the pharmacy.  Renew financial assistance today.  Plan for follow up follow up in 4 months or sooner if needed.    Have a great day and stay safe!

## 2020-05-01 NOTE — Assessment & Plan Note (Signed)
Jay Smith is a 29 year old male with well-controlled HIV-1 disease obtained from MSM exposure.  No current signs/symptoms of opportunistic infection or progressive HIV disease.  Reviewed previous lab work and discussed the plan of care.  Renew financial assistance today.  Continue current dose of Biktarvy.  Plan for follow-up in 4 months or sooner if needed.

## 2020-05-06 ENCOUNTER — Ambulatory Visit: Payer: Self-pay

## 2020-05-06 ENCOUNTER — Other Ambulatory Visit: Payer: Self-pay

## 2020-05-07 LAB — COMPLETE METABOLIC PANEL WITH GFR
AG Ratio: 1.5 (calc) (ref 1.0–2.5)
ALT: 13 U/L (ref 9–46)
AST: 13 U/L (ref 10–40)
Albumin: 4.6 g/dL (ref 3.6–5.1)
Alkaline phosphatase (APISO): 67 U/L (ref 36–130)
BUN: 14 mg/dL (ref 7–25)
CO2: 27 mmol/L (ref 20–32)
Calcium: 9.4 mg/dL (ref 8.6–10.3)
Chloride: 106 mmol/L (ref 98–110)
Creat: 1.22 mg/dL (ref 0.60–1.35)
GFR, Est African American: 92 mL/min/{1.73_m2} (ref >=60)
GFR, Est Non African American: 80 mL/min/{1.73_m2} (ref >=60)
Globulin: 3.1 g/dL (calc) (ref 1.9–3.7)
Glucose, Bld: 100 mg/dL — ABNORMAL HIGH (ref 65–99)
Potassium: 4.2 mmol/L (ref 3.5–5.3)
Sodium: 139 mmol/L (ref 135–146)
Total Bilirubin: 0.6 mg/dL (ref 0.2–1.2)
Total Protein: 7.7 g/dL (ref 6.1–8.1)

## 2020-05-07 LAB — HIV-1 RNA QUANT-NO REFLEX-BLD
HIV 1 RNA Quant: <20 copies/mL — AB
HIV-1 RNA Quant, Log: <1.3 Log copies/mL — AB

## 2020-07-29 ENCOUNTER — Telehealth: Payer: Self-pay

## 2020-07-29 NOTE — Telephone Encounter (Signed)
My Chart Cabenuva Message  

## 2020-08-03 NOTE — Telephone Encounter (Signed)
Spoke to patient. Schedule an appr to do his ADAP tomorrow .Marland Kitchen Will see about a 30 day Medication assistance or samples when he comes in for his appt.

## 2020-08-04 ENCOUNTER — Encounter: Payer: Self-pay | Admitting: Family

## 2020-08-04 ENCOUNTER — Other Ambulatory Visit: Payer: Self-pay

## 2020-08-04 ENCOUNTER — Ambulatory Visit: Payer: Self-pay

## 2020-08-17 ENCOUNTER — Telehealth: Payer: Self-pay

## 2020-08-17 NOTE — Telephone Encounter (Signed)
[facility] Patient Advocate Encounter  I called [employer] to rea-activate patient application for Biktarvy for this patient who is uninsured.    Patient is approved 08/17/20 through 10/21/20.  Shared information with [facility].  BIN      [id number] PCN    [id number] GRP    [id number] ID        [id number]   Clearance Coots, CPhT Specialty Pharmacy Patient [facility] for Infectious Disease Phone: [phone] Fax:  [phone]

## 2020-09-07 ENCOUNTER — Other Ambulatory Visit: Payer: Self-pay

## 2020-09-07 ENCOUNTER — Encounter: Payer: Self-pay | Admitting: Family

## 2020-09-07 ENCOUNTER — Ambulatory Visit (INDEPENDENT_AMBULATORY_CARE_PROVIDER_SITE_OTHER): Payer: Self-pay | Admitting: Family

## 2020-09-07 VITALS — BP 117/60 | HR 66 | Temp 98.0°F | Ht 68.0 in | Wt 170.0 lb

## 2020-09-07 DIAGNOSIS — Z0000 Encounter for general adult medical examination without abnormal findings: Secondary | ICD-10-CM

## 2020-09-07 DIAGNOSIS — Z113 Encounter for screening for infections with a predominantly sexual mode of transmission: Secondary | ICD-10-CM

## 2020-09-07 DIAGNOSIS — B20 Human immunodeficiency virus [HIV] disease: Secondary | ICD-10-CM

## 2020-09-07 MED ORDER — CABOTEGRAVIR & RILPIVIRINE ER 600 & 900 MG/3ML IM SUER: 1.0000 | Freq: Once | INTRAMUSCULAR | 0 refills | Status: DC

## 2020-09-07 MED ORDER — CABOTEGRAVIR & RILPIVIRINE ER 600 & 900 MG/3ML IM SUER: 1.0000 | Freq: Once | INTRAMUSCULAR | 0 refills | Status: AC

## 2020-09-07 MED ORDER — BIKTARVY 50-200-25 MG PO TABS: 1.0000 | ORAL_TABLET | Freq: Every day | ORAL | 5 refills | Status: DC

## 2020-09-07 NOTE — Assessment & Plan Note (Signed)
·   Declines vaccinations today.  Discussed importance of safe sexual practice to reduce risk of STI.  Condoms declined.  Information for dental clinic provided to schedule routine dental care.

## 2020-09-07 NOTE — Assessment & Plan Note (Signed)
Jay Smith has well-controlled HIV disease with good adherence and tolerance to his ART regimen of Biktarvy.  No signs/symptoms of opportunistic infection or progressive HIV disease.  We reviewed previous lab work and discussed plan of care.  We will transfer his medication to [facility] with oral lead in starting today and planned first injection during the last week in December.  Counseled by pharmacy today.  Check blood work today.  Plan for follow-up in 1 month or sooner if needed.

## 2020-09-07 NOTE — Progress Notes (Signed)
Subjective:    Patient ID: Jay Smith, male    DOB: 12-Nov-1990, 29 y.o.   MRN: [id number]  Chief Complaint  Patient presents with  . Follow-up     HPI:  Jay Smith is a 29 y.o. male with HIV disease who was last seen in the office on 05/01/2020 with good adherence and tolerance to his ART regimen of Biktarvy.  Viral load at the time was undetectable and CD4 count of 926.  No recent lab work completed.  Here today for routine follow-up.  [name] continues to take his [place] daily as prescribed with no adverse side effects or missed doses since his last office visit.  Overall feeling well today with no new concerns/complaints. Denies fevers, chills, night sweats, headaches, changes in vision, neck pain/stiffness, nausea, diarrhea, vomiting, lesions or rashes.  [name] has had several issues obtaining his medication from the pharmacy which have since been resolved.  Denies feelings of being down, depressed, or hopeless recently.  He smokes marijuana socially and drinks alcohol rarely.  No current tobacco use.  Declines condoms.  Due for routine dental care.  Plans to receive his Covid booster this weekend.    Allergies  Allergen Reactions  . Hydrocodone Swelling      Outpatient Medications Prior to Visit  Medication Sig Dispense Refill  . bictegravir-emtricitabine-tenofovir AF (BIKTARVY) 50-200-25 MG TABS tablet Take 1 tablet by mouth daily. 30 tablet 5  . albuterol (VENTOLIN HFA) 108 (90 Base) MCG/ACT inhaler Inhale 2 puffs into the lungs every 6 (six) hours as needed for wheezing or shortness of breath. (Patient not taking: Reported on 09/07/2020) 18 g 2   No facility-administered medications prior to visit.     Past Medical History:  Diagnosis Date  . Asthma   . Eczema   . HIV infection [facility])      Past Surgical History:  Procedure Laterality Date  . COSMETIC SURGERY     Great Toe Surgery       Review of Systems  Constitutional:  Negative for appetite change, chills, fatigue, fever and unexpected weight change.  Eyes: Negative for visual disturbance.  Respiratory: Negative for cough, chest tightness, shortness of breath and wheezing.   Cardiovascular: Negative for chest pain and leg swelling.  Gastrointestinal: Negative for abdominal pain, constipation, diarrhea, nausea and vomiting.  Genitourinary: Negative for dysuria, flank pain, frequency, genital sores, hematuria and urgency.  Skin: Negative for rash.  Allergic/Immunologic: Negative for immunocompromised state.  Neurological: Negative for dizziness and headaches.      Objective:    BP 117/60   Pulse 66   Temp 98 F (36.7 C)   Ht _0  (1.727 m)   Wt 170 lb (77.1 kg)   BMI 25.85 kg/m  Nursing note and vital signs reviewed.  Physical Exam Constitutional:      General: He is not in acute distress.    Appearance: He is well-developed.  Eyes:     Conjunctiva/sclera: Conjunctivae normal.  Cardiovascular:     Rate and Rhythm: Normal rate and regular rhythm.     Heart sounds: Normal heart sounds. No murmur heard.  No friction rub. No gallop.   Pulmonary:     Effort: Pulmonary effort is normal. No respiratory distress.     Breath sounds: Normal breath sounds. No wheezing or rales.  Chest:     Chest wall: No tenderness.  Abdominal:     General: Bowel sounds are normal.     Palpations: Abdomen is  soft.     Tenderness: There is no abdominal tenderness.  Musculoskeletal:     Cervical back: Neck supple.  Lymphadenopathy:     Cervical: No cervical adenopathy.  Skin:    General: Skin is warm and dry.     Findings: No rash.  Neurological:     Mental Status: He is alert and oriented to person, place, and time.  Psychiatric:        Behavior: Behavior normal.        Thought Content: Thought content normal.        Judgment: Judgment normal.      Depression screen [facility] 2/9 05/01/2020 12/31/2018 09/11/2018 07/19/2018 06/21/2018  Decreased Interest 0 0 0 0  0  Down, Depressed, Hopeless 0 0 0 0 0  PHQ - 2 Score 0 0 0 0 0       Assessment & Plan:    Patient Active Problem List   Diagnosis Date Noted  . Low serum HDL 12/31/2018  . Elevated blood pressure reading 09/11/2018  . Healthcare maintenance 09/05/2018  . HIV disease ([place]) 06/22/2018  . Low back pain 09/06/2016     Problem List Items Addressed This Visit      Other   HIV disease [facility])    [name] has well-controlled HIV disease with good adherence and tolerance to his ART regimen of Biktarvy.  No signs/symptoms of opportunistic infection or progressive HIV disease.  We reviewed previous lab work and discussed plan of care.  We will transfer his medication to [facility] with oral lead in starting today and planned first injection during the last week in December.  Counseled by pharmacy today.  Check blood work today.  Plan for follow-up in 1 month or sooner if needed.      Relevant Medications   cabotegravir & rilpivirine ER (CABENUVA) 600 & 900 MG/3ML injection   Other Relevant Orders   COMPLETE METABOLIC PANEL WITH GFR   HIV-1 RNA quant-no reflex-bld   T-helper cell (CD4)- ([facility] clinic only)   Healthcare maintenance     Declines vaccinations today.  Discussed importance of safe sexual practice to reduce risk of STI.  Condoms declined.  Information for dental clinic provided to schedule routine dental care.       Other Visit Diagnoses    Screening for STDs (sexually transmitted diseases)    -  Primary   Relevant Orders   RPR       I have discontinued 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 I am also having him maintain his albuterol and cabotegravir & rilpivirine ER.   Meds ordered this encounter  Medications  . DISCONTD: bictegravir-emtricitabine-tenofovir AF (BIKTARVY) 50-200-25 MG TABS tablet    Sig: Take 1 tablet by mouth daily.    Dispense:  30 tablet    Refill:  5    Order Specific Question:   Supervising Provider    Answer:   Carlyle Basques  [[id number]]  . DISCONTD: cabotegravir & rilpivirine ER (CABENUVA) 600 & 900 MG/3ML injection    Sig: Inject 1 kit into the muscle once for 1 dose.    Dispense:  6 mL    Refill:  0    Call [phone] and ask for Cassie (pharmacist) for any questions! Thanks!    Order Specific Question:   Supervising Provider    Answer:   Thayer Headings [[id number]]  . cabotegravir & rilpivirine ER (CABENUVA) 600 & 900 MG/3ML injection    Sig: Inject 1 kit into the muscle once  for 1 dose.    Dispense:  6 mL    Refill:  0    Call [phone] and ask for Cassie (pharmacist) for any questions! Thanks!    Order Specific Question:   Supervising Provider    Answer:   Thayer Headings [[id number]]     Follow-up: Return in about 4 months (around 01/05/2021), or if symptoms worsen or fail to improve.   Terri Piedra, MSN, FNP-C Nurse Practitioner [facility] for [facility]ious Disease [place] number: [phone]

## 2020-09-07 NOTE — Patient Instructions (Addendum)
Nice to see you.  We will check your lab work today.  Continue to take your [place] daily as prescribed.  Refills have been sent to the pharmacy.   Please call [facility]) to schedule/follow up on your dental care at [phone]  Plan for follow up in 4 months or sooner if needed.   Will need to renew your ADAP after January 1st.   Have a great day and stay safe!  Happy Holidays!

## 2020-09-08 ENCOUNTER — Telehealth: Payer: Self-pay | Admitting: Pharmacist

## 2020-09-08 LAB — T-HELPER CELL (CD4) - (RCID CLINIC ONLY)
CD4 % Helper T Cell: 37 % (ref 33–65)
CD4 T Cell Abs: 1055 /uL (ref 400–1790)

## 2020-09-08 MED ORDER — CABOTEGRAVIR & RILPIVIRINE ER 400 & 600 MG/2ML IM SUER: 1.0000 | INTRAMUSCULAR | 11 refills | Status: DC

## 2020-09-08 NOTE — Addendum Note (Signed)
Addended by: Aggie Cosier L on: 09/08/2020 11:15 AM   Modules accepted: Orders

## 2020-09-08 NOTE — Telephone Encounter (Signed)
Spoke with Information systems manager at [employer] in [place]. She will send the initial injection kit on 12/21 for [place] to store in our fridge in anticipation of patient's first injection appointment on 12/27. She will call the clinic each month and speak to Butch Penny or myself to get the ok to ship to the clinic.

## 2020-09-08 NOTE — Progress Notes (Signed)
HPI: Jay Smith is a 29 y.o. male who presents to the [place] clinic for HIV follow-up.  Patient Active Problem List   Diagnosis Date Noted  . Low serum HDL 12/31/2018  . Elevated blood pressure reading 09/11/2018  . Healthcare maintenance 09/05/2018  . HIV disease ([place]) 06/22/2018  . Low back pain 09/06/2016    Patient's Medications  New Prescriptions   CABOTEGRAVIR & RILPIVIRINE ER (CABENUVA) 400 & 600 MG/2ML INJECTION    Inject 1 kit into the muscle every 30 (thirty) days.   CABOTEGRAVIR & RILPIVIRINE ER (CABENUVA) 600 & 900 MG/3ML INJECTION    Inject 1 kit into the muscle once for 1 dose.  Previous Medications   ALBUTEROL (VENTOLIN HFA) 108 (90 BASE) MCG/ACT INHALER    Inhale 2 puffs into the lungs every 6 (six) hours as needed for wheezing or shortness of breath.  Modified Medications   No medications on file  Discontinued Medications   BICTEGRAVIR-EMTRICITABINE-TENOFOVIR AF (BIKTARVY) 50-200-25 MG TABS TABLET    Take 1 tablet by mouth daily.    Allergies: Allergies  Allergen Reactions  . Hydrocodone Swelling    Past Medical History: Past Medical History:  Diagnosis Date  . Asthma   . Eczema   . HIV infection [name])     Social History: Social History   Socioeconomic History  . Marital status: Single    Spouse name: Not on file  . Number of children: Not on file  . Years of education: Not on file  . Highest education level: Not on file  Occupational History  . Occupation: Information systems manager  Tobacco Use  . Smoking status: Never Smoker  . Smokeless tobacco: Never Used  Vaping Use  . Vaping Use: Never used  Substance and Sexual Activity  . Alcohol use: Yes    Alcohol/week: 0.0 standard drinks    Comment: rare  . Drug use: Yes    Types: Marijuana    Comment: socially  . Sexual activity: Not Currently    Birth control/protection: Condom  Other Topics Concern  . Not on file  Social History Narrative  . Not on file   Social Determinants of  Health   Financial Resource Strain:   . Difficulty of Paying Living Expenses: Not on file  Food Insecurity:   . Worried About Charity fundraiser in the Last Year: Not on file  . Ran Out of Food in the Last Year: Not on file  Transportation Needs:   . Lack of Transportation (Medical): Not on file  . Lack of Transportation (Non-Medical): Not on file  Physical Activity:   . Days of Exercise per Week: Not on file  . Minutes of Exercise per Session: Not on file  Stress:   . Feeling of Stress : Not on file  Social Connections:   . Frequency of Communication with Friends and Family: Not on file  . Frequency of Social Gatherings with Friends and Family: Not on file  . Attends Religious Services: Not on file  . Active Member of Clubs or Organizations: Not on file  . Attends Archivist Meetings: Not on file  . Marital Status: Not on file    Labs: Lab Results  Component Value Date   HIV1RNAQUANT <20 DETECTED (A) 05/01/2020   HIV1RNAQUANT 51 (H) 11/13/2019   HIV1RNAQUANT <20 NOT DETECTED 04/18/2019   CD4TABS 1,055 09/07/2020   CD4TABS 926 05/01/2020   CD4TABS 871 11/13/2019    RPR and STI Lab Results  Component Value  Date   LABRPR REACTIVE (A) 09/07/2020   LABRPR NON-REACTIVE 11/13/2019   LABRPR NON-REACTIVE 04/18/2019   LABRPR NON-REACTIVE 12/11/2018   LABRPR NON-REACTIVE 09/11/2018   RPRTITER 1:4 (H) 09/07/2020    STI Results GC CT  06/01/2018 Negative Negative  02/12/2015 Negative Negative    Hepatitis B Lab Results  Component Value Date   HEPBSAB BORDERLINE (A) 06/01/2018   HEPBSAG NON-REACTIVE 06/01/2018   HEPBCAB NON-REACTIVE 06/01/2018   Hepatitis C Lab Results  Component Value Date   HEPCAB NON-REACTIVE 06/01/2018   Hepatitis A Lab Results  Component Value Date   HAV NON-REACTIVE 06/01/2018   Lipids: Lab Results  Component Value Date   CHOL 193 04/18/2019   TRIG 234 (H) 04/18/2019   HDL 31 (L) 04/18/2019   CHOLHDL 6.2 (H) 04/18/2019    LDLCALC 124 (H) 04/18/2019    Current HIV Regimen: Biktarvy  Assessment: Patient is currently taking Biktarvy but will transition to [place] today.  Discussed the process of starting Cabenuva injections including the need to take oral lead-in therapy for 28-30 days to assess tolerability to cabotegravir. Counseled patient to take one tablet of rilpivirine + one tablet of cabotegravir once daily WITH food. Encouraged patient to take the tablets around the same time each day and to make sure it is with a meal. Advised to never take one without the other. Discussed possible side effects such as nausea, headache, insomnia, and abnormal dreams. Explained the importance of not missing any doses of the 30 day regimen to make sure no resistance develops. Explained to patient that we will set a target date each month to administer injections. Explained the importance of showing up to appointments and not missing any monthly injections. Warned that if patient misses 2 appointments, it will be reassessed by their provider whether they are a good candidate for injection therapy. Made appointment for patient to come in and receive initial injection 28-30 days after starting oral lead-in therapy.  Counseled that [place] is two separate intramuscular injections in the gluteal muscle on each side for each monthly visit. Cautioned on possible side effects such as injection-site reactions, fatigue, headache, nasuea, rash, and dizziness.  Will make patient's follow up appointments for 6 months to hep with compliance. Answered all questions. Gave patient my card to call me with any issues.  Plan: - Start oral lead-in therapy - F/u on 12/27 for initial injection visit - Follow ups made through May after initial injection  Jay Smith L. Shizue Kaseman, PharmD, BCIDP, AAHIVP, CPP Clinical Pharmacist Practitioner Infectious Diseases [place] for Infectious Disease 09/08/2020, 11:34 AM

## 2020-09-08 NOTE — Addendum Note (Signed)
Addended by: Aggie Cosier L on: 09/08/2020 11:43 AM   Modules accepted: Orders

## 2020-09-09 ENCOUNTER — Telehealth: Payer: Self-pay

## 2020-09-09 LAB — COMPLETE METABOLIC PANEL WITH GFR
AG Ratio: 1.3 (calc) (ref 1.0–2.5)
ALT: 16 U/L (ref 9–46)
AST: 21 U/L (ref 10–40)
Albumin: 4.4 g/dL (ref 3.6–5.1)
Alkaline phosphatase (APISO): 62 U/L (ref 36–130)
BUN: 13 mg/dL (ref 7–25)
CO2: 23 mmol/L (ref 20–32)
Calcium: 9.2 mg/dL (ref 8.6–10.3)
Chloride: 106 mmol/L (ref 98–110)
Creat: 1.12 mg/dL (ref 0.60–1.35)
GFR, Est African American: 102 mL/min/{1.73_m2} (ref >=60)
GFR, Est Non African American: 88 mL/min/{1.73_m2} (ref >=60)
Globulin: 3.4 g/dL (calc) (ref 1.9–3.7)
Glucose, Bld: 84 mg/dL (ref 65–99)
Potassium: 3.9 mmol/L (ref 3.5–5.3)
Sodium: 139 mmol/L (ref 135–146)
Total Bilirubin: 0.7 mg/dL (ref 0.2–1.2)
Total Protein: 7.8 g/dL (ref 6.1–8.1)

## 2020-09-09 LAB — RPR: RPR Ser Ql: REACTIVE — AB

## 2020-09-09 LAB — HIV-1 RNA QUANT-NO REFLEX-BLD
HIV 1 RNA Quant: <20 Copies/mL — ABNORMAL HIGH
HIV-1 RNA Quant, Log: <1.3 Log cps/mL — ABNORMAL HIGH

## 2020-09-09 LAB — FLUORESCENT TREPONEMAL AB(FTA)-IGG-BLD: Fluorescent Treponemal ABS: REACTIVE — AB

## 2020-09-09 LAB — RPR TITER: RPR Titer: 1:4 {titer} — ABNORMAL HIGH

## 2020-09-09 NOTE — Telephone Encounter (Signed)
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

## 2020-09-09 NOTE — Telephone Encounter (Signed)
Spoke with patient to relay positive Syphilis results and schedule for 2.4 million units Bicillin IM once per Jay Eke, NP. Patient scheduled for 09/10/20.  Sandie Ano, RN

## 2020-09-10 ENCOUNTER — Ambulatory Visit: Payer: Self-pay

## 2020-09-10 ENCOUNTER — Other Ambulatory Visit: Payer: Self-pay

## 2020-09-10 NOTE — Telephone Encounter (Signed)
Patient here for bicillin injections. RN asked if he had any questions. He asked if his treatment a few months ago was not effective. Per chart, no recent treatment history noted.  RN reached out to [place] at [facility]. Per their records, patient had a positive RPR with titer 1:32 04/10/20 and was treated with bicillin 2.4 million units once.  They considered the recent 1:4 as proof of sufficient treatment. RN relayed to Tammy Sours, who agreed.  No need for bicillin injection today.  Patient was relieved.  Appointment cancelled. Andree Coss, RN

## 2020-09-29 ENCOUNTER — Telehealth: Payer: Self-pay | Admitting: Pharmacist

## 2020-09-29 NOTE — Telephone Encounter (Signed)
Patient's Cabenuva initial injection has arrived from [employer] and is located in the medication room refrigerator on the bottom shelf. His initial injection appointment is next Monday 12/27 at 4pm.

## 2020-10-04 NOTE — Progress Notes (Unsigned)
HPI: Jay Smith is a 29 y.o. male who presents to the [place] clinic for HIV follow-up.  Patient Active Problem List   Diagnosis Date Noted  . Low serum HDL 12/31/2018  . Elevated blood pressure reading 09/11/2018  . Healthcare maintenance 09/05/2018  . HIV disease ([place]) 06/22/2018  . Low back pain 09/06/2016    Patient's Medications  New Prescriptions   No medications on file  Previous Medications   ALBUTEROL (VENTOLIN HFA) 108 (90 BASE) MCG/ACT INHALER    Inhale 2 puffs into the lungs every 6 (six) hours as needed for wheezing or shortness of breath.   CABOTEGRAVIR & RILPIVIRINE ER (CABENUVA) 400 & 600 MG/2ML INJECTION    Inject 1 kit into the muscle every 30 (thirty) days.  Modified Medications   No medications on file  Discontinued Medications   No medications on file    Allergies: Allergies  Allergen Reactions  . Hydrocodone Swelling    Past Medical History: Past Medical History:  Diagnosis Date  . Asthma   . Eczema   . HIV infection [facility])     Social History: Social History   Socioeconomic History  . Marital status: Single    Spouse name: Not on file  . Number of children: Not on file  . Years of education: Not on file  . Highest education level: Not on file  Occupational History  . Occupation: Information systems manager  Tobacco Use  . Smoking status: Never Smoker  . Smokeless tobacco: Never Used  Vaping Use  . Vaping Use: Never used  Substance and Sexual Activity  . Alcohol use: Yes    Alcohol/week: 0.0 standard drinks    Comment: rare  . Drug use: Yes    Types: Marijuana    Comment: socially  . Sexual activity: Not Currently    Birth control/protection: Condom  Other Topics Concern  . Not on file  Social History Narrative  . Not on file   Social Determinants of Health   Financial Resource Strain: Not on file  Food Insecurity: Not on file  Transportation Needs: Not on file  Physical Activity: Not on file  Stress: Not on file   Social Connections: Not on file    Labs: Lab Results  Component Value Date   HIV1RNAQUANT <20 (H) 09/07/2020   HIV1RNAQUANT <20 DETECTED (A) 05/01/2020   HIV1RNAQUANT 51 (H) 11/13/2019   CD4TABS 1,055 09/07/2020   CD4TABS 926 05/01/2020   CD4TABS 871 11/13/2019    RPR and STI Lab Results  Component Value Date   LABRPR REACTIVE (A) 09/07/2020   LABRPR NON-REACTIVE 11/13/2019   LABRPR NON-REACTIVE 04/18/2019   LABRPR NON-REACTIVE 12/11/2018   LABRPR NON-REACTIVE 09/11/2018   RPRTITER 1:4 (H) 09/07/2020    STI Results GC CT  06/01/2018 Negative Negative  02/12/2015 Negative Negative    Hepatitis B Lab Results  Component Value Date   HEPBSAB BORDERLINE (A) 06/01/2018   HEPBSAG NON-REACTIVE 06/01/2018   HEPBCAB NON-REACTIVE 06/01/2018   Hepatitis C Lab Results  Component Value Date   HEPCAB NON-REACTIVE 06/01/2018   Hepatitis A Lab Results  Component Value Date   HAV NON-REACTIVE 06/01/2018   Lipids: Lab Results  Component Value Date   CHOL 193 04/18/2019   TRIG 234 (H) 04/18/2019   HDL 31 (L) 04/18/2019   CHOLHDL 6.2 (H) 04/18/2019   LDLCALC 124 (H) 04/18/2019    Current HIV Regimen: Cabenuva  Assessment: Jay Smith presents today in good spirits for his initial Cabenuva injection for HIV  management. He reports ****  Completed oral therapy lead-in? No side effects??  Counseled that [place] is two separate intramuscular injections in the gluteal muscle on each side for each monthly visit. Cautioned on possible side effects such as injection-site reactions, fatigue, headache, nasuea, rash, and dizziness.  Will make patient's follow up appointments for 6 months to help with compliance.    Plan: -Administered first Cabenuva injection -Next injection scheduled for 10/29/20 -Follow ups made through May after initial injection  Jay Smith, PharmD, BCPS PGY2 [place]

## 2020-10-05 ENCOUNTER — Ambulatory Visit (INDEPENDENT_AMBULATORY_CARE_PROVIDER_SITE_OTHER): Payer: Self-pay | Admitting: Family

## 2020-10-05 ENCOUNTER — Other Ambulatory Visit: Payer: Self-pay

## 2020-10-05 ENCOUNTER — Encounter: Payer: Self-pay | Admitting: Family

## 2020-10-05 ENCOUNTER — Ambulatory Visit: Payer: Self-pay | Admitting: Pharmacist

## 2020-10-05 VITALS — BP 121/74 | HR 92 | Temp 98.0°F | Resp 16 | Wt 170.0 lb

## 2020-10-05 DIAGNOSIS — B20 Human immunodeficiency virus [HIV] disease: Secondary | ICD-10-CM

## 2020-10-05 MED ORDER — CABOTEGRAVIR & RILPIVIRINE ER 600 & 900 MG/3ML IM SUER
1.0000 | Freq: Once | INTRAMUSCULAR | Status: AC
Start: 2020-10-05 — End: 2020-10-05
  Administered 2020-10-05: 16:00:00 1 via INTRAMUSCULAR

## 2020-10-05 NOTE — Assessment & Plan Note (Signed)
Mr. Slomski has done well with the oral lead in medications for Cabenuva with no adverse side effects. Previous lab work reviewed and medication reconciliation performed. Injectable medication given by nursing staff today. He was observed for 30 minutes after with no problems. Check lab work today. Plan for follow up in 1 month for next injection.

## 2020-10-05 NOTE — Patient Instructions (Signed)
Good to see you.  We will check your lab work today and then again in one month.   Plan for follow up in 1 month as planned for your next injection.  Let us know if you have any questions.  Have a great day and stay safe!  Happy New Year!

## 2020-10-05 NOTE — Progress Notes (Signed)
Subjective:    Patient ID: Jay Smith, male    DOB: April 10, 1991, 29 y.o.   MRN: [id number]  Chief Complaint  Patient presents with   Follow-up    Injection      HPI:  Jay Smith is a 29 y.o. male with HIV disease last seen in the office on 11/29 with good adherence and tolerance to his ART regimen of Biktarvy and started on oral lead in for [place]. Viral load at the time was undetectable and CD4 count was 1,055. Here today for first injection medication.   Jay Smith has been taking the oral rilipivirine and cabetegravir daily as prescribed with no adverse side effects or missed doses. Overall feeling well today and excited to get started on injectable medications. Denies fevers, chills, night sweats, headaches, changes in vision, neck pain/stiffness, nausea, diarrhea, vomiting, lesions or rashes.  Previous blood work reviewed and medications have been reconciled.  Allergies  Allergen Reactions   Hydrocodone Swelling      Outpatient Medications Prior to Visit  Medication Sig Dispense Refill   albuterol (VENTOLIN HFA) 108 (90 Base) MCG/ACT inhaler Inhale 2 puffs into the lungs every 6 (six) hours as needed for wheezing or shortness of breath. 18 g 2   cabotegravir & rilpivirine ER (CABENUVA) 400 & 600 MG/2ML injection Inject 1 kit into the muscle every 30 (thirty) days. 4 mL 11   No facility-administered medications prior to visit.     Past Medical History:  Diagnosis Date   Asthma    Eczema    HIV infection ([place])      Past Surgical History:  Procedure Laterality Date   COSMETIC SURGERY     Great Toe Surgery       Review of Systems  Constitutional: Negative for appetite change, chills, fatigue, fever and unexpected weight change.  Eyes: Negative for visual disturbance.  Respiratory: Negative for cough, chest tightness, shortness of breath and wheezing.   Cardiovascular: Negative for chest pain and leg swelling.  Gastrointestinal:  Negative for abdominal pain, constipation, diarrhea, nausea and vomiting.  Genitourinary: Negative for dysuria, flank pain, frequency, genital sores, hematuria and urgency.  Skin: Negative for rash.  Allergic/Immunologic: Negative for immunocompromised state.  Neurological: Negative for dizziness and headaches.      Objective:    BP 121/74    Pulse 92    Temp 98 F (36.7 C)    Resp 16    Wt 170 lb (77.1 kg)    SpO2 99%    BMI 25.85 kg/m  Nursing note and vital signs reviewed.  Physical Exam Constitutional:      General: He is not in acute distress.    Appearance: He is well-developed.  HENT:     Mouth/Throat:     Mouth: Oropharynx is clear and moist.  Eyes:     Conjunctiva/sclera: Conjunctivae normal.  Cardiovascular:     Rate and Rhythm: Normal rate and regular rhythm.     Pulses: Intact distal pulses.     Heart sounds: Normal heart sounds. No murmur heard. No friction rub. No gallop.   Pulmonary:     Effort: Pulmonary effort is normal. No respiratory distress.     Breath sounds: Normal breath sounds. No wheezing or rales.  Chest:     Chest wall: No tenderness.  Abdominal:     General: Bowel sounds are normal.     Palpations: Abdomen is soft.     Tenderness: There is no abdominal tenderness.  Musculoskeletal:  Cervical back: Neck supple.  Lymphadenopathy:     Cervical: No cervical adenopathy.  Skin:    General: Skin is warm and dry.     Findings: No rash.  Neurological:     Mental Status: He is alert and oriented to person, place, and time.  Psychiatric:        Mood and Affect: Mood and affect normal.        Behavior: Behavior normal.        Thought Content: Thought content normal.        Judgment: Judgment normal.      Depression screen [facility] 2/9 05/01/2020 12/31/2018 09/11/2018 07/19/2018 06/21/2018  Decreased Interest 0 0 0 0 0  Down, Depressed, Hopeless 0 0 0 0 0  PHQ - 2 Score 0 0 0 0 0       Assessment & Plan:    Patient Active Problem List    Diagnosis Date Noted   Low serum HDL 12/31/2018   Elevated blood pressure reading 09/11/2018   Healthcare maintenance 09/05/2018   HIV disease ([place]) 06/22/2018   Low back pain 09/06/2016     Problem List Items Addressed This Visit      Other   HIV disease ([place]) - Primary    Jay Smith has done well with the oral lead in medications for [place] with no adverse side effects. Previous lab work reviewed and medication reconciliation performed. Injectable medication given by nursing staff today. He was observed for 30 minutes after with no problems. Check lab work today. Plan for follow up in 1 month for next injection.       Relevant Orders   CBC   COMPLETE METABOLIC PANEL WITH GFR   HIV-1 RNA quant-no reflex-bld   T-helper cell (CD4)- ([facility] clinic only)       I am having Jay Smith "Jay Smith" maintain his albuterol and cabotegravir & rilpivirine ER. We administered cabotegravir & rilpivirine ER.   Meds ordered this encounter  Medications   cabotegravir & rilpivirine ER (CABENUVA) 600 & 900 MG/3ML injection 1 kit     Follow-up: Return in about 1 month (around 11/05/2020), or if symptoms worsen or fail to improve.   Terri Piedra, MSN, FNP-C Nurse Practitioner [facility] for Infectious Disease [place] number: [phone]

## 2020-10-06 LAB — T-HELPER CELL (CD4) - (RCID CLINIC ONLY)
CD4 % Helper T Cell: 36 % (ref 33–65)
CD4 T Cell Abs: 835 /uL (ref 400–1790)

## 2020-10-16 LAB — COMPLETE METABOLIC PANEL WITH GFR
AG Ratio: 1.4 (calc) (ref 1.0–2.5)
ALT: 16 U/L (ref 9–46)
AST: 13 U/L (ref 10–40)
Albumin: 4.5 g/dL (ref 3.6–5.1)
Alkaline phosphatase (APISO): 62 U/L (ref 36–130)
BUN: 13 mg/dL (ref 7–25)
CO2: 27 mmol/L (ref 20–32)
Calcium: 9.7 mg/dL (ref 8.6–10.3)
Chloride: 104 mmol/L (ref 98–110)
Creat: 1.08 mg/dL (ref 0.60–1.35)
GFR, Est African American: 106 mL/min/{1.73_m2} (ref >=60)
GFR, Est Non African American: 92 mL/min/{1.73_m2} (ref >=60)
Globulin: 3.2 g/dL (calc) (ref 1.9–3.7)
Glucose, Bld: 92 mg/dL (ref 65–99)
Potassium: 4 mmol/L (ref 3.5–5.3)
Sodium: 139 mmol/L (ref 135–146)
Total Bilirubin: 0.5 mg/dL (ref 0.2–1.2)
Total Protein: 7.7 g/dL (ref 6.1–8.1)

## 2020-10-16 LAB — HIV-1 RNA QUANT-NO REFLEX-BLD
HIV 1 RNA Quant: <20 Copies/mL — ABNORMAL HIGH
HIV-1 RNA Quant, Log: <1.3 Log cps/mL — ABNORMAL HIGH

## 2020-10-16 LAB — CBC
HCT: 41 % (ref 38.5–50.0)
Hemoglobin: 13.5 g/dL (ref 13.2–17.1)
MCH: 25.2 pg — ABNORMAL LOW (ref 27.0–33.0)
MCHC: 32.9 g/dL (ref 32.0–36.0)
MCV: 76.5 fL — ABNORMAL LOW (ref 80.0–100.0)
MPV: 11.2 fL (ref 7.5–12.5)
Platelets: 249 10*3/uL (ref 140–400)
RBC: 5.36 10*6/uL (ref 4.20–5.80)
RDW: 15.2 % — ABNORMAL HIGH (ref 11.0–15.0)
WBC: 6.8 10*3/uL (ref 3.8–10.8)

## 2020-10-22 ENCOUNTER — Telehealth: Payer: Self-pay | Admitting: Pharmacist

## 2020-10-22 NOTE — Telephone Encounter (Signed)
Patient's Jay Smith will be delivered from [facility] on 10/30/20 for his next appointment on 11/05/20.

## 2020-10-29 ENCOUNTER — Other Ambulatory Visit: Payer: Self-pay

## 2020-10-29 ENCOUNTER — Ambulatory Visit: Payer: Self-pay

## 2020-10-30 ENCOUNTER — Telehealth: Payer: Self-pay

## 2020-10-30 NOTE — Telephone Encounter (Signed)
[facility] Patient Advocate Encounter  Patient's medication Jay Smith) have been delivered to [facility] from [facility] and will be administered on his next appointment on 11/05/20.  Clearance Coots , CPhT Specialty Pharmacy Patient [facility] for Infectious Disease Phone: [phone] Fax:  [phone]

## 2020-11-05 ENCOUNTER — Ambulatory Visit (INDEPENDENT_AMBULATORY_CARE_PROVIDER_SITE_OTHER): Payer: Self-pay | Admitting: Family

## 2020-11-05 ENCOUNTER — Encounter: Payer: Self-pay | Admitting: Family

## 2020-11-05 ENCOUNTER — Other Ambulatory Visit: Payer: Self-pay

## 2020-11-05 VITALS — BP 114/73 | HR 86 | Temp 98.1°F | Wt 172.0 lb

## 2020-11-05 DIAGNOSIS — B20 Human immunodeficiency virus [HIV] disease: Secondary | ICD-10-CM

## 2020-11-05 MED ORDER — CABOTEGRAVIR & RILPIVIRINE ER 400 & 600 MG/2ML IM SUER
1.0000 | Freq: Once | INTRAMUSCULAR | Status: AC
  Administered 2020-11-05: 1 via INTRAMUSCULAR

## 2020-11-05 NOTE — Assessment & Plan Note (Signed)
Mr. Sizemore continues to have well controlled HIV disease with good tolerance to first month of Cabenuva. Maintenance injection of 400mg /600 mg provided today without complications. Plan for follow up in 1 month or sooner if needed.

## 2020-11-05 NOTE — Patient Instructions (Signed)
Nice to see you.  You have received your monthly dose of Cabenuva.  Plan for follow up in 1 month as planned for your next dose.   Have a great day and stay safe!

## 2020-11-05 NOTE — Progress Notes (Signed)
Subjective:    Patient ID: Jay Smith, male    DOB: April 13, 1991, 30 y.o.   MRN: [id number]  Chief Complaint  Patient presents with  . HIV Positive/AIDS     HPI:  Jay Smith is a 30 y.o. male with HIV disease who was last seen on 12/27 with good tolerance to his first injection of Cabenuva following successful lead in.   Jay Smith has tolerated the first month well with no adverse side effects. Overall feeling well today and ready for his next injection.      Allergies  Allergen Reactions  . Hydrocodone Swelling      Outpatient Medications Prior to Visit  Medication Sig Dispense Refill  . albuterol (VENTOLIN HFA) 108 (90 Base) MCG/ACT inhaler Inhale 2 puffs into the lungs every 6 (six) hours as needed for wheezing or shortness of breath. 18 g 2  . cabotegravir & rilpivirine ER (CABENUVA) 400 & 600 MG/2ML injection Inject 1 kit into the muscle every 30 (thirty) days. 4 mL 11   No facility-administered medications prior to visit.     Past Medical History:  Diagnosis Date  . Asthma   . Eczema   . HIV infection [facility])      Past Surgical History:  Procedure Laterality Date  . COSMETIC SURGERY     Great Toe Surgery       Review of Systems  Constitutional: Negative for appetite change, chills, fatigue, fever and unexpected weight change.  Eyes: Negative for visual disturbance.  Respiratory: Negative for cough, chest tightness, shortness of breath and wheezing.   Cardiovascular: Negative for chest pain and leg swelling.  Gastrointestinal: Negative for abdominal pain, constipation, diarrhea, nausea and vomiting.  Genitourinary: Negative for dysuria, flank pain, frequency, genital sores, hematuria and urgency.  Skin: Negative for rash.  Allergic/Immunologic: Negative for immunocompromised state.  Neurological: Negative for dizziness and headaches.      Objective:    BP 114/73   Pulse 86   Temp 98.1 F (36.7 C) (Oral)   Wt 172 lb (78 kg)    BMI 26.15 kg/m  Nursing note and vital signs reviewed.  Physical Exam Constitutional:      General: He is not in acute distress.    Appearance: He is well-developed.  HENT:     Mouth/Throat:     Mouth: Oropharynx is clear and moist.  Eyes:     Conjunctiva/sclera: Conjunctivae normal.  Cardiovascular:     Rate and Rhythm: Normal rate and regular rhythm.     Pulses: Intact distal pulses.     Heart sounds: Normal heart sounds. No murmur heard. No friction rub. No gallop.   Pulmonary:     Effort: Pulmonary effort is normal. No respiratory distress.     Breath sounds: Normal breath sounds. No wheezing or rales.  Chest:     Chest wall: No tenderness.  Abdominal:     General: Bowel sounds are normal.     Palpations: Abdomen is soft.     Tenderness: There is no abdominal tenderness.  Musculoskeletal:     Cervical back: Neck supple.  Lymphadenopathy:     Cervical: No cervical adenopathy.  Skin:    General: Skin is warm and dry.     Findings: No rash.  Neurological:     Mental Status: He is alert and oriented to person, place, and time.  Psychiatric:        Mood and Affect: Mood and affect normal.  Behavior: Behavior normal.        Thought Content: Thought content normal.        Judgment: Judgment normal.      Depression screen [facility] 2/9 11/05/2020 05/01/2020 12/31/2018 09/11/2018 07/19/2018  Decreased Interest 0 0 0 0 0  Down, Depressed, Hopeless 0 0 0 0 0  PHQ - 2 Score 0 0 0 0 0       Assessment & Plan:    Patient Active Problem List   Diagnosis Date Noted  . Low serum HDL 12/31/2018  . Elevated blood pressure reading 09/11/2018  . Healthcare maintenance 09/05/2018  . HIV disease ([place]) 06/22/2018  . Low back pain 09/06/2016     Problem List Items Addressed This Visit      Other   HIV disease ([place]) - Primary    Jay Smith continues to have well controlled HIV disease with good tolerance to first month of Cabenuva. Maintenance injection of 412m/600 mg  provided today without complications. Plan for follow up in 1 month or sooner if needed.           I am having Jay Smith "Jay Smith maintain his albuterol and cabotegravir & rilpivirine ER. We administered cabotegravir & rilpivirine ER.   Meds ordered this encounter  Medications  . cabotegravir & rilpivirine ER (CABENUVA) 400 & 600 MG/2ML injection 1 kit     Follow-up: Return in about 1 month (around 12/06/2020), or if symptoms worsen or fail to improve.   GTerri Piedra MSN, FNP-C Nurse Practitioner [facility] [place]: [phone]

## 2020-12-03 ENCOUNTER — Telehealth: Payer: Self-pay

## 2020-12-03 NOTE — Telephone Encounter (Signed)
[facility] Patient Advocate Encounter  Patient's medication (CABENUVA) have been delivered to [facility] from [facility] and will be injected on  12/07/20 at his next appointment.  Clearance Coots , CPhT Specialty Pharmacy Patient [facility] for Infectious Disease Phone: [phone] Fax:  [phone]

## 2020-12-07 ENCOUNTER — Other Ambulatory Visit: Payer: Self-pay

## 2020-12-07 ENCOUNTER — Ambulatory Visit (INDEPENDENT_AMBULATORY_CARE_PROVIDER_SITE_OTHER): Payer: Self-pay | Admitting: Family

## 2020-12-07 ENCOUNTER — Encounter: Payer: Self-pay | Admitting: Family

## 2020-12-07 VITALS — BP 112/74 | HR 59 | Temp 98.8°F | Ht 67.0 in | Wt 170.0 lb

## 2020-12-07 DIAGNOSIS — Z0000 Encounter for general adult medical examination without abnormal findings: Secondary | ICD-10-CM

## 2020-12-07 DIAGNOSIS — B20 Human immunodeficiency virus [HIV] disease: Secondary | ICD-10-CM

## 2020-12-07 DIAGNOSIS — Z113 Encounter for screening for infections with a predominantly sexual mode of transmission: Secondary | ICD-10-CM

## 2020-12-07 MED ORDER — CABOTEGRAVIR & RILPIVIRINE ER 400 & 600 MG/2ML IM SUER
1.0000 | Freq: Once | INTRAMUSCULAR | Status: AC
Start: 2020-12-07 — End: 2020-12-07
  Administered 2020-12-07: 1 via INTRAMUSCULAR

## 2020-12-07 MED ORDER — CABOTEGRAVIR & RILPIVIRINE ER 600 & 900 MG/3ML IM SUER
INTRAMUSCULAR | 6 refills | Status: DC
Start: 2020-12-07 — End: 2021-08-16

## 2020-12-07 NOTE — Assessment & Plan Note (Signed)
   Discussed importance of safe sexual practice to reduce risk of STI. Condoms declined.   Due for routine dental visit. Information for appointment in AVS.

## 2020-12-07 NOTE — Progress Notes (Signed)
 Subjective:    Patient ID: Desman H Ballantine, male    DOB: 11/03/1990, 30 y.o.   MRN: [id number]  Chief Complaint  Patient presents with  . Follow-up    Cabenuva     HPI:  Kalob H Nuttall is a 30 y.o. male with HIV disease who was last seen on 1/27 for his monthly injection of Cabenuva with no adverse side effects who presents today for his monthly injection.  Mr. Jennings has been doing well with no new concerns/complaints. No adverse side effects with the exception of some soreness from the injections that lasts a couple of days. Denies fevers, chills, night sweats, headaches, changes in vision, neck pain/stiffness, nausea, diarrhea, vomiting, lesions or rashes.  Mr. Barella continues to have coverage throught [facility]. Denies feelings of being down, depressed or hopeless recently. Uses marijuana socially, with no tobacco use and rare alcohol. Declines condoms. STI testing today.    Allergies  Allergen Reactions  . Hydrocodone Swelling      Outpatient Medications Prior to Visit  Medication Sig Dispense Refill  . albuterol (VENTOLIN HFA) 108 (90 Base) MCG/ACT inhaler Inhale 2 puffs into the lungs every 6 (six) hours as needed for wheezing or shortness of breath. 18 g 2  . cabotegravir & rilpivirine ER (CABENUVA) 400 & 600 MG/2ML injection Inject 1 kit into the muscle every 30 (thirty) days. 4 mL 11   No facility-administered medications prior to visit.     Past Medical History:  Diagnosis Date  . Asthma   . Eczema   . HIV infection (H[facility])      Past Surgical History:  Procedure Laterality Date  . COSMETIC SURGERY     Great Toe Surgery       Review of Systems  Constitutional: Negative for appetite change, chills, fatigue, fever and unexpected weight change.  Eyes: Negative for visual disturbance.  Respiratory: Negative for cough, chest tightness, shortness of breath and wheezing.   Cardiovascular: Negative for chest pain and leg swelling.  Gastrointestinal:  Negative for abdominal pain, constipation, diarrhea, nausea and vomiting.  Genitourinary: Negative for dysuria, flank pain, frequency, genital sores, hematuria and urgency.  Skin: Negative for rash.  Allergic/Immunologic: Negative for immunocompromised state.  Neurological: Negative for dizziness and headaches.      Objective:    BP 112/74   Pulse (!) 59   Temp 98.8 F (37.1 C) (Oral)   Ht 5' 7" (1.702 m)   Wt 170 lb (77.1 kg)   SpO2 100%   BMI 26.63 kg/m  Nursing note and vital signs reviewed.  Physical Exam Constitutional:      General: He is not in acute distress.    Appearance: He is well-developed.  HENT:     Mouth/Throat:     Mouth: Oropharynx is clear and moist.  Eyes:     Conjunctiva/sclera: Conjunctivae normal.  Cardiovascular:     Rate and Rhythm: Normal rate and regular rhythm.     Pulses: Intact distal pulses.     Heart sounds: Normal heart sounds. No murmur heard. No friction rub. No gallop.   Pulmonary:     Effort: Pulmonary effort is normal. No respiratory distress.     Breath sounds: Normal breath sounds. No wheezing or rales.  Chest:     Chest wall: No tenderness.  Abdominal:     General: Bowel sounds are normal.     Palpations: Abdomen is soft.     Tenderness: There is no abdominal tenderness.  Musculoskeletal:       Cervical back: Neck supple.  Lymphadenopathy:     Cervical: No cervical adenopathy.  Skin:    General: Skin is warm and dry.     Findings: No rash.  Neurological:     Mental Status: He is alert and oriented to person, place, and time.  Psychiatric:        Mood and Affect: Mood and affect normal.        Behavior: Behavior normal.        Thought Content: Thought content normal.        Judgment: Judgment normal.      Depression screen PHQ 2/9 12/07/2020 11/05/2020 05/01/2020 12/31/2018 09/11/2018  Decreased Interest 0 0 0 0 0  Down, Depressed, Hopeless 0 0 0 0 0  PHQ - 2 Score 0 0 0 0 0       Assessment & Plan:    Patient  Active Problem List   Diagnosis Date Noted  . Low serum HDL 12/31/2018  . Elevated blood pressure reading 09/11/2018  . Healthcare maintenance 09/05/2018  . HIV disease (H[facility]) 06/22/2018  . Low back pain 09/06/2016     Problem List Items Addressed This Visit      Other   HIV disease ([facility]) - Primary    Mr. Southall continues to receive his monthly Cabenuva with no adverse side effects. No signs/symptoms of opportunistic infection or progressive HIV disease. We reviewed the plan of care and he is interested in changing to q 2 month injections. Check lab work today. Will start Cabenuva 600/900 at next office visit for q 2 month injections. Plan for follow up in 1 month or sooner if needed.       Relevant Medications   cabotegravir & rilpivirine ER (CABENUVA) 600 & 900 MG/3ML injection   Other Relevant Orders   COMPLETE METABOLIC PANEL WITH GFR   HIV-1 RNA quant-no reflex-bld   T-helper cell (CD4)- ([facility] clinic only)   Healthcare maintenance     Discussed importance of safe sexual practice to reduce risk of STI. Condoms declined.   Due for routine dental visit. Information for appointment in AVS.        Other Visit Diagnoses    Screening for STDs (sexually transmitted diseases)       Relevant Orders   RPR   Urine cytology ancillary only([place])       I have discontinued Olander H. Konigsberg "Chris"'s cabotegravir & rilpivirine ER. I am also having him start on cabotegravir & rilpivirine ER. Additionally, I am having him maintain his albuterol. We administered cabotegravir & rilpivirine ER.   Meds ordered this encounter  Medications  . cabotegravir & rilpivirine ER (CABENUVA) 400 & 600 MG/2ML injection 1 kit  . cabotegravir & rilpivirine ER (CABENUVA) 600 & 900 MG/3ML injection    Sig: Inject 1 kit intramuscularly every 2 months.    Dispense:  6 mL    Refill:  6    Order Specific Question:   Supervising Provider    Answer:   SNIDER, [name] [[id number]]     Follow-up:  Return in about 1 month (around 01/04/2021), or if symptoms worsen or fail to improve.   Greg Calone, MSN, FNP-C Nurse Practitioner [facility] Main number: [phone]   

## 2020-12-07 NOTE — Assessment & Plan Note (Addendum)
Mr. Baumgart continues to receive his monthly Cabenuva with no adverse side effects. No signs/symptoms of opportunistic infection or progressive HIV disease. We reviewed the plan of care and he is interested in changing to q 2 month injections. Check lab work today. Will start Cabenuva 600/900 at next office visit for q 2 month injections. Plan for follow up in 1 month or sooner if needed.

## 2020-12-07 NOTE — Patient Instructions (Signed)
Nice to see you.  We will check your lab work today.  Plan for follow up in 1 month or sooner if needed.  Please call [facility]) to schedule/follow up on your dental care at [phone]  Have a great day and stay safe!

## 2020-12-08 LAB — T-HELPER CELL (CD4) - (RCID CLINIC ONLY)
CD4 % Helper T Cell: 37 % (ref 33–65)
CD4 T Cell Abs: 1033 /uL (ref 400–1790)

## 2020-12-09 ENCOUNTER — Encounter: Payer: Self-pay | Admitting: Family

## 2020-12-09 LAB — COMPLETE METABOLIC PANEL WITH GFR
AG Ratio: 1.4 (calc) (ref 1.0–2.5)
ALT: 16 U/L (ref 9–46)
AST: 17 U/L (ref 10–40)
Albumin: 4.2 g/dL (ref 3.6–5.1)
Alkaline phosphatase (APISO): 63 U/L (ref 36–130)
BUN: 12 mg/dL (ref 7–25)
CO2: 28 mmol/L (ref 20–32)
Calcium: 9.2 mg/dL (ref 8.6–10.3)
Chloride: 105 mmol/L (ref 98–110)
Creat: 1.07 mg/dL (ref 0.60–1.35)
GFR, Est African American: 107 mL/min/{1.73_m2} (ref >=60)
GFR, Est Non African American: 93 mL/min/{1.73_m2} (ref >=60)
Globulin: 3.1 g/dL (calc) (ref 1.9–3.7)
Glucose, Bld: 89 mg/dL (ref 65–99)
Potassium: 4.1 mmol/L (ref 3.5–5.3)
Sodium: 139 mmol/L (ref 135–146)
Total Bilirubin: 0.6 mg/dL (ref 0.2–1.2)
Total Protein: 7.3 g/dL (ref 6.1–8.1)

## 2020-12-09 LAB — URINE CYTOLOGY ANCILLARY ONLY
Chlamydia: NEGATIVE
Comment: NEGATIVE
Comment: NORMAL
Neisseria Gonorrhea: NEGATIVE

## 2020-12-09 LAB — HIV-1 RNA QUANT-NO REFLEX-BLD
HIV 1 RNA Quant: <20 Copies/mL — ABNORMAL HIGH
HIV-1 RNA Quant, Log: <1.3 Log cps/mL — ABNORMAL HIGH

## 2020-12-09 LAB — RPR: RPR Ser Ql: REACTIVE — AB

## 2020-12-09 LAB — FLUORESCENT TREPONEMAL AB(FTA)-IGG-BLD: Fluorescent Treponemal ABS: REACTIVE — AB

## 2020-12-09 LAB — RPR TITER: RPR Titer: 1:1 {titer} — ABNORMAL HIGH

## 2020-12-09 NOTE — Progress Notes (Signed)
Patient provided Cabenuva medication.   Sandie Ano, RN

## 2020-12-22 ENCOUNTER — Other Ambulatory Visit: Payer: Self-pay

## 2020-12-31 ENCOUNTER — Telehealth: Payer: Self-pay

## 2020-12-31 NOTE — Telephone Encounter (Signed)
[facility] Patient Advocate Encounter  Patient's medication [name]) have been delivered to [facility] from [facility] and will be administered on patient next appointment on 01/04/21.  Clearance Coots , CPhT Specialty Pharmacy Patient [facility] for Infectious Disease Phone: [phone] Fax:  [phone]

## 2021-01-04 ENCOUNTER — Encounter: Payer: Self-pay | Admitting: Family

## 2021-01-04 ENCOUNTER — Ambulatory Visit (INDEPENDENT_AMBULATORY_CARE_PROVIDER_SITE_OTHER): Payer: Self-pay | Admitting: Family

## 2021-01-04 ENCOUNTER — Other Ambulatory Visit: Payer: Self-pay

## 2021-01-04 DIAGNOSIS — B20 Human immunodeficiency virus [HIV] disease: Secondary | ICD-10-CM

## 2021-01-04 MED ORDER — CABOTEGRAVIR & RILPIVIRINE ER 600 & 900 MG/3ML IM SUER
1.0000 | Freq: Once | INTRAMUSCULAR | Status: AC
  Administered 2021-01-04: 1 via INTRAMUSCULAR

## 2021-01-04 NOTE — Patient Instructions (Signed)
Nice to see you.  We will check your lab work today.  Plan for follow up in 2 months or sooner if needed with lab work on the same day.  Have a great day and stay safe! 

## 2021-01-04 NOTE — Assessment & Plan Note (Signed)
Jay Smith continues to have well-controlled HIV disease with good adherence and tolerance to his ART regimen Cabenuva.  No signs/symptoms of opportunistic infection or progressive HIV disease.  We reviewed lab work and discussed plan of care. Transitioning to q 2 month dosing today with 600/900 mg dosing. Plan for follow up in 2 months or sooner if needed.

## 2021-01-04 NOTE — Progress Notes (Signed)
Subjective:    Patient ID: Jay Smith, male    DOB: 1991-07-08, 30 y.o.   MRN: [id number]  Chief Complaint  Patient presents with  . HIV Positive/AIDS     HPI:  Jay Smith is a 30 y.o. male with HIV disease last seen on 12/07/20 for monthly injection of Cabenuva which was tolerated with no adverse side effects. Viral load was undetectable and CD4 count hof 1,033. Here today for next injection and to transition between monthly to q 2 monthly Cabenuva dosing.   Mr. Strojny has been doing well since his last office visit with no new concerns/complaints. Tolerating medication well. Denies fevers, chills, night sweats, headaches, changes in vision, neck pain/stiffness, nausea, diarrhea, vomiting, lesions or rashes.  Mr. Liz remains covered through [place]. Denies feelings of being down, depressed or hopeless. Continues to smoke marijuana socially and with rare alcohol consumption and no tobacco usage    Allergies  Allergen Reactions  . Hydrocodone Swelling      Outpatient Medications Prior to Visit  Medication Sig Dispense Refill  . albuterol (VENTOLIN HFA) 108 (90 Base) MCG/ACT inhaler Inhale 2 puffs into the lungs every 6 (six) hours as needed for wheezing or shortness of breath. 18 g 2  . cabotegravir & rilpivirine ER (CABENUVA) 600 & 900 MG/3ML injection Inject 1 kit intramuscularly every 2 months. 6 mL 6   No facility-administered medications prior to visit.     Past Medical History:  Diagnosis Date  . Asthma   . Eczema   . HIV infection [facility])      Past Surgical History:  Procedure Laterality Date  . COSMETIC SURGERY     Great Toe Surgery     Review of Systems  Constitutional: Negative for appetite change, chills, fatigue, fever and unexpected weight change.  Eyes: Negative for visual disturbance.  Respiratory: Negative for cough, chest tightness, shortness of breath and wheezing.   Cardiovascular: Negative for chest pain and leg swelling.   Gastrointestinal: Negative for abdominal pain, constipation, diarrhea, nausea and vomiting.  Genitourinary: Negative for dysuria, flank pain, frequency, genital sores, hematuria and urgency.  Skin: Negative for rash.  Allergic/Immunologic: Negative for immunocompromised state.  Neurological: Negative for dizziness and headaches.      Objective:    There were no vitals taken for this visit. Nursing note and vital signs reviewed.  Physical Exam Constitutional:      General: He is not in acute distress.    Appearance: He is well-developed.  Eyes:     Conjunctiva/sclera: Conjunctivae normal.  Cardiovascular:     Rate and Rhythm: Normal rate and regular rhythm.     Heart sounds: Normal heart sounds. No murmur heard. No friction rub. No gallop.   Pulmonary:     Effort: Pulmonary effort is normal. No respiratory distress.     Breath sounds: Normal breath sounds. No wheezing or rales.  Chest:     Chest wall: No tenderness.  Abdominal:     General: Bowel sounds are normal.     Palpations: Abdomen is soft.     Tenderness: There is no abdominal tenderness.  Musculoskeletal:     Cervical back: Neck supple.  Lymphadenopathy:     Cervical: No cervical adenopathy.  Skin:    General: Skin is warm and dry.     Findings: No rash.  Neurological:     Mental Status: He is alert and oriented to person, place, and time.  Psychiatric:  Behavior: Behavior normal.        Thought Content: Thought content normal.        Judgment: Judgment normal.      Depression screen [facility] 2/9 12/07/2020 11/05/2020 05/01/2020 12/31/2018 09/11/2018  Decreased Interest 0 0 0 0 0  Down, Depressed, Hopeless 0 0 0 0 0  PHQ - 2 Score 0 0 0 0 0       Assessment & Plan:    Patient Active Problem List   Diagnosis Date Noted  . Low serum HDL 12/31/2018  . Elevated blood pressure reading 09/11/2018  . Healthcare maintenance 09/05/2018  . HIV disease ([place]) 06/22/2018  . Low back pain 09/06/2016      Problem List Items Addressed This Visit      Other   HIV disease ([place]) - Primary   Relevant Orders   HIV-1 RNA quant-no reflex-bld   T-helper cell (CD4)- ([facility] clinic only)      I am having Delontae H. Vanengen "[name]" maintain his albuterol and cabotegravir & rilpivirine ER.   Follow-up: Return in about 2 months (around 03/06/2021), or if symptoms worsen or fail to improve.   Terri Piedra, MSN, FNP-C Nurse Practitioner [facility] for Infectious Disease [place] number: [phone]

## 2021-01-05 ENCOUNTER — Encounter: Payer: Self-pay | Admitting: Family

## 2021-01-05 LAB — T-HELPER CELL (CD4) - (RCID CLINIC ONLY)
CD4 % Helper T Cell: 36 % (ref 33–65)
CD4 T Cell Abs: 1075 /uL (ref 400–1790)

## 2021-01-07 ENCOUNTER — Other Ambulatory Visit (HOSPITAL_COMMUNITY): Payer: Self-pay

## 2021-01-07 LAB — HIV-1 RNA QUANT-NO REFLEX-BLD
HIV 1 RNA Quant: NOT DETECTED Copies/mL
HIV-1 RNA Quant, Log: NOT DETECTED Log cps/mL

## 2021-02-03 ENCOUNTER — Encounter: Payer: Self-pay | Admitting: Family

## 2021-02-26 ENCOUNTER — Telehealth: Payer: Self-pay | Admitting: Pharmacist

## 2021-02-26 NOTE — Telephone Encounter (Signed)
Patient's medication [name]) have been delivered to [facility] from [facility] and will be administered on patient next appointment on 03/04/21.

## 2021-03-04 ENCOUNTER — Encounter: Payer: Self-pay | Admitting: Family

## 2021-03-04 ENCOUNTER — Ambulatory Visit (INDEPENDENT_AMBULATORY_CARE_PROVIDER_SITE_OTHER): Payer: Self-pay | Admitting: Family

## 2021-03-04 ENCOUNTER — Other Ambulatory Visit: Payer: Self-pay

## 2021-03-04 VITALS — BP 119/73 | HR 71 | Resp 16 | Ht 67.0 in | Wt 170.1 lb

## 2021-03-04 DIAGNOSIS — B20 Human immunodeficiency virus [HIV] disease: Secondary | ICD-10-CM

## 2021-03-04 MED ORDER — CABOTEGRAVIR & RILPIVIRINE ER 600 & 900 MG/3ML IM SUER
1.0000 | Freq: Once | INTRAMUSCULAR | Status: AC
  Administered 2021-03-04: 1 via INTRAMUSCULAR

## 2021-03-04 NOTE — Assessment & Plan Note (Signed)
Jay Smith continues to have well controlled HIV disease with good tolerance to Cabenuva and viral load remains undetectable. No signs/symptoms of opportunistic infection. Reviewed previous lab work and discussed plan of care. Cabenuva injection provided without complication. Check viral load today. Plan for follow up in 2 months or sooner if needed.

## 2021-03-04 NOTE — Progress Notes (Signed)
Patient ID: Jay Smith, male    DOB: 1991/02/05, 30 y.o.   MRN: [id number]    Subjective:    Chief Complaint  Patient presents with  . Follow-up    B20 cabenuva injection   . HIV Positive/AIDS     HPI:  Jay Smith is a 30 y.o. male with HIV disease last seen on 01/04/21 for q 2 month Cabenuva injection with viral load being undetectable and CD4 count of 1,075. Here today for next injection.  Mr. Medeiros tolerated his previous injection well with a couple of days of soreness. Overall feeling well today with no new concerns or complaints. Denies fevers, chills, night sweats, headaches, changes in vision, neck pain/stiffness, nausea, diarrhea, vomiting, lesions or rashes.   Allergies  Allergen Reactions  . Hydrocodone Swelling      Outpatient Medications Prior to Visit  Medication Sig Dispense Refill  . albuterol (VENTOLIN HFA) 108 (90 Base) MCG/ACT inhaler Inhale 2 puffs into the lungs every 6 (six) hours as needed for wheezing or shortness of breath. 18 g 2  . cabotegravir & rilpivirine ER (CABENUVA) 600 & 900 MG/3ML injection Inject 1 kit intramuscularly every 2 months. 6 mL 6   No facility-administered medications prior to visit.     Past Medical History:  Diagnosis Date  . Asthma   . Eczema   . HIV infection [facility])      Past Surgical History:  Procedure Laterality Date  . COSMETIC SURGERY     Great Toe Surgery       Review of Systems  Constitutional: Negative for appetite change, chills, fatigue, fever and unexpected weight change.  Eyes: Negative for visual disturbance.  Respiratory: Negative for cough, chest tightness, shortness of breath and wheezing.   Cardiovascular: Negative for chest pain and leg swelling.  Gastrointestinal: Negative for abdominal pain, constipation, diarrhea, nausea and vomiting.  Genitourinary: Negative for dysuria, flank pain, frequency, genital sores, hematuria and urgency.  Skin: Negative for rash.   Allergic/Immunologic: Negative for immunocompromised state.  Neurological: Negative for dizziness and headaches.      Objective:    BP 119/73   Pulse 71   Resp 16   Ht 5' 7"  (1.702 m)   Wt 170 lb 1.6 oz (77.2 kg)   SpO2 99%   BMI 26.64 kg/m  Nursing note and vital signs reviewed.  Physical Exam Constitutional:      General: He is not in acute distress.    Appearance: He is well-developed.  Eyes:     Conjunctiva/sclera: Conjunctivae normal.  Cardiovascular:     Rate and Rhythm: Normal rate and regular rhythm.     Heart sounds: Normal heart sounds. No murmur heard. No friction rub. No gallop.   Pulmonary:     Effort: Pulmonary effort is normal. No respiratory distress.     Breath sounds: Normal breath sounds. No wheezing or rales.  Chest:     Chest wall: No tenderness.  Abdominal:     General: Bowel sounds are normal.     Palpations: Abdomen is soft.     Tenderness: There is no abdominal tenderness.  Musculoskeletal:     Cervical back: Neck supple.  Lymphadenopathy:     Cervical: No cervical adenopathy.  Skin:    General: Skin is warm and dry.     Findings: No rash.  Neurological:     Mental Status: He is alert and oriented to person, place, and time.  Psychiatric:  Behavior: Behavior normal.        Thought Content: Thought content normal.        Judgment: Judgment normal.      Depression screen [facility] 2/9 12/07/2020 11/05/2020 05/01/2020 12/31/2018 09/11/2018  Decreased Interest 0 0 0 0 0  Down, Depressed, Hopeless 0 0 0 0 0  PHQ - 2 Score 0 0 0 0 0       Assessment & Plan:    Patient Active Problem List   Diagnosis Date Noted  . Low serum HDL 12/31/2018  . Elevated blood pressure reading 09/11/2018  . Healthcare maintenance 09/05/2018  . HIV disease ([place]) 06/22/2018  . Low back pain 09/06/2016     Problem List Items Addressed This Visit      Other   HIV disease ([place]) - Primary    [name] continues to have well controlled HIV disease with  good tolerance to Cabenuva and viral load remains undetectable. No signs/symptoms of opportunistic infection. Reviewed previous lab work and discussed plan of care. Cabenuva injection provided without complication. Check viral load today. Plan for follow up in 2 months or sooner if needed.       Relevant Orders   HIV 1 RNA quant-no reflex-bld       I am having Jay Smith "[name]" maintain his albuterol and cabotegravir & rilpivirine ER. We administered cabotegravir & rilpivirine ER.   Meds ordered this encounter  Medications  . cabotegravir & rilpivirine ER (CABENUVA) 600 & 900 MG/3ML injection 1 kit     Follow-up: Return in about 2 months (around 05/04/2021), or if symptoms worsen or fail to improve.   Terri Piedra, MSN, FNP-C Nurse Practitioner [facility] for Infectious Disease [place] number: [phone]

## 2021-03-04 NOTE — Patient Instructions (Signed)
Nice to see you.   We will check your viral load today.  Plan for follow up in 2 months or sooner if needed.   Have a great day and stay safe!

## 2021-03-06 LAB — HIV-1 RNA QUANT-NO REFLEX-BLD
HIV 1 RNA Quant: 128 Copies/mL — ABNORMAL HIGH
HIV-1 RNA Quant, Log: 2.11 Log cps/mL — ABNORMAL HIGH

## 2021-03-12 ENCOUNTER — Other Ambulatory Visit: Payer: Self-pay

## 2021-03-12 DIAGNOSIS — B20 Human immunodeficiency virus [HIV] disease: Secondary | ICD-10-CM

## 2021-03-12 DIAGNOSIS — Z79899 Other long term (current) drug therapy: Secondary | ICD-10-CM

## 2021-03-12 NOTE — Addendum Note (Signed)
Addended by: Linna Hoff D on: 03/12/2021 08:53 AM   Modules accepted: Orders

## 2021-03-15 LAB — HIV-1 RNA QUANT-NO REFLEX-BLD
HIV 1 RNA Quant: 181 Copies/mL — ABNORMAL HIGH
HIV-1 RNA Quant, Log: 2.26 Log cps/mL — ABNORMAL HIGH

## 2021-03-16 ENCOUNTER — Other Ambulatory Visit: Payer: Self-pay | Admitting: Family

## 2021-03-16 ENCOUNTER — Telehealth: Payer: Self-pay

## 2021-03-16 DIAGNOSIS — B20 Human immunodeficiency virus [HIV] disease: Secondary | ICD-10-CM

## 2021-03-16 NOTE — Telephone Encounter (Signed)
Samples up front for patient to pick up.  

## 2021-03-16 NOTE — Telephone Encounter (Signed)
Medication Samples have been provided to the patient.  Drug name: Symtuza        Strength: 800/150/200/10 mg Qty: 30  LOT: [id number]   Exp.Date: 10/2022  Clearance Coots, CPhT Specialty Pharmacy Patient [facility] for Infectious Disease Phone: [phone] Fax:  [phone]

## 2021-03-17 ENCOUNTER — Other Ambulatory Visit: Payer: Self-pay

## 2021-03-17 DIAGNOSIS — B20 Human immunodeficiency virus [HIV] disease: Secondary | ICD-10-CM

## 2021-03-20 LAB — HIV-1 RNA QUANT-NO REFLEX-BLD
HIV 1 RNA Quant: 163 Copies/mL — ABNORMAL HIGH
HIV-1 RNA Quant, Log: 2.21 Log cps/mL — ABNORMAL HIGH

## 2021-03-24 DIAGNOSIS — J45909 Unspecified asthma, uncomplicated: Secondary | ICD-10-CM | POA: Insufficient documentation

## 2021-04-13 ENCOUNTER — Telehealth: Payer: Self-pay

## 2021-04-13 NOTE — Telephone Encounter (Signed)
Medication Samples have been provided to the patient.  Drug name: Symtuza        Strength: 800/150/200/10 mg Qty: 30  LOT: [id number]   Exp.Date: 10/2022  Dosing instructions: Take one tablet by mouth once daily with food   Kiyon Fidalgo, CPhT Specialty Pharmacy Patient [facility] for Infectious Disease Phone: [phone] Fax:  [phone]  

## 2021-04-19 ENCOUNTER — Other Ambulatory Visit: Payer: Self-pay

## 2021-04-19 ENCOUNTER — Ambulatory Visit: Payer: Self-pay

## 2021-05-04 ENCOUNTER — Encounter: Payer: Self-pay | Admitting: Infectious Diseases

## 2021-05-12 ENCOUNTER — Telehealth: Payer: Self-pay

## 2021-05-12 ENCOUNTER — Other Ambulatory Visit: Payer: Self-pay

## 2021-05-12 NOTE — Telephone Encounter (Signed)
Medication Samples have been provided to the patient.  Drug name: Symtuza        Strength: 800/150/200/10 mg Qty: 30 (1 bottle)  LOT: [id number]   Exp.Date: 03/23  Dosing instructions: Take one tablet by mouth once daily with food  Cruz Bong, CPhT Specialty Pharmacy Patient [facility] for [facility] Disease Phone: [phone] Fax:  [phone]  

## 2021-05-21 ENCOUNTER — Encounter: Payer: Self-pay | Admitting: Family

## 2021-05-21 ENCOUNTER — Other Ambulatory Visit: Payer: Self-pay

## 2021-05-21 ENCOUNTER — Ambulatory Visit (INDEPENDENT_AMBULATORY_CARE_PROVIDER_SITE_OTHER): Payer: Self-pay | Admitting: Family

## 2021-05-21 VITALS — BP 108/72 | HR 67 | Temp 98.6°F | Wt 167.0 lb

## 2021-05-21 DIAGNOSIS — Z113 Encounter for screening for infections with a predominantly sexual mode of transmission: Secondary | ICD-10-CM

## 2021-05-21 DIAGNOSIS — Z0000 Encounter for general adult medical examination without abnormal findings: Secondary | ICD-10-CM

## 2021-05-21 DIAGNOSIS — B20 Human immunodeficiency virus [HIV] disease: Secondary | ICD-10-CM

## 2021-05-21 MED ORDER — SYMTUZA 800-150-200-10 MG PO TABS: 1.0000 | ORAL_TABLET | Freq: Every day | ORAL | 3 refills | Status: DC

## 2021-05-21 NOTE — Assessment & Plan Note (Signed)
Jay Smith is presumed to have well-controlled HIV with good adherence and tolerance to his ART regimen of Symtuza.  Awaiting new Gen[employer] Archive to determine any potential resistance or cause for increasing viral load while on Cabenuva.  Discussed plan of care to include checking blood work today and awaiting [employer].  If no resistance is noted we will consider restarting Cabenuva on monthly injections or possibly returning to [place] or staying on Symtuza depending upon resistance if present.  Plan for follow-up in 3 months or sooner if needed pending [employer] results.

## 2021-05-21 NOTE — Assessment & Plan Note (Signed)
   Discussed importance of safe sexual practice to reduce risk of STI. Condoms provided.  

## 2021-05-21 NOTE — Progress Notes (Signed)
Brief Narrative   Patient ID: Jay Smith, male    DOB: 1990-12-12, 30 y.o.   MRN: [id number]    Subjective:    Chief Complaint  Patient presents with   Follow-up    B20     HPI:  Jay Smith is a 30 y.o. male with HIV disease last seen on 03/04/2021 with good tolerance to [place].  Was noted to have increasing viral load at 128 then on 03/12/21 with viral load of 181 and then on 03/17/2021 following injection with viral load of 163.  Was started on Symtuza. Genosure Archive was ordered however due to error was unable to be resulted.  A new Genosure was drawn on 05/11/21 with results pending.  Here today for routine follow-up.  Jay Smith continues to take the Symtuza daily as prescribed with no adverse side effects or missed doses since starting medication.  Overall feeling well today with no new concerns/complaints. Denies fevers, chills, night sweats, headaches, changes in vision, neck pain/stiffness, nausea, diarrhea, vomiting, lesions or rashes.  Jay Smith has no problems obtaining medication from the pharmacy and remains covered through [employer] which he has renewed.  Denies feelings of being down, depressed, or hopeless recently.  No current recreational or illicit drug use, tobacco use, and occasional alcohol consumption.  Condoms offered and provided.    Allergies  Allergen Reactions   Hydrocodone Swelling      Outpatient Medications Prior to Visit  Medication Sig Dispense Refill   Darun-Cobic-Emtricit-TenofAF (SYMTUZA PO) Symtuza     albuterol (VENTOLIN HFA) 108 (90 Base) MCG/ACT inhaler Inhale 2 puffs into the lungs every 6 (six) hours as needed for wheezing or shortness of breath. (Patient not taking: Reported on 05/21/2021) 18 g 2   cabotegravir & rilpivirine ER (CABENUVA) 600 & 900 MG/3ML injection Inject 1 kit intramuscularly every 2 months. (Patient not taking: Reported on 05/21/2021) 6 mL 6   No facility-administered medications prior to visit.      Past Medical History:  Diagnosis Date   Asthma    Eczema    HIV infection ([place])      Past Surgical History:  Procedure Laterality Date   COSMETIC SURGERY     Great Toe Surgery      Review of Systems  Constitutional:  Negative for appetite change, chills, fatigue, fever and unexpected weight change.  Eyes:  Negative for visual disturbance.  Respiratory:  Negative for cough, chest tightness, shortness of breath and wheezing.   Cardiovascular:  Negative for chest pain and leg swelling.  Gastrointestinal:  Negative for abdominal pain, constipation, diarrhea, nausea and vomiting.  Genitourinary:  Negative for dysuria, flank pain, frequency, genital sores, hematuria and urgency.  Skin:  Negative for rash.  Allergic/Immunologic: Negative for immunocompromised state.  Neurological:  Negative for dizziness and headaches.     Objective:    BP 108/72   Pulse 67   Temp 98.6 F (37 C) (Oral)   Wt 167 lb (75.8 kg)   SpO2 98%   BMI 26.16 kg/m  Nursing note and vital signs reviewed.  Physical Exam Constitutional:      General: He is not in acute distress.    Appearance: He is well-developed.  Eyes:     Conjunctiva/sclera: Conjunctivae normal.  Cardiovascular:     Rate and Rhythm: Normal rate and regular rhythm.     Heart sounds: Normal heart sounds. No murmur heard.   No friction rub. No gallop.  Pulmonary:     Effort:  Pulmonary effort is normal. No respiratory distress.     Breath sounds: Normal breath sounds. No wheezing or rales.  Chest:     Chest wall: No tenderness.  Abdominal:     General: Bowel sounds are normal.     Palpations: Abdomen is soft.     Tenderness: There is no abdominal tenderness.  Musculoskeletal:     Cervical back: Neck supple.  Lymphadenopathy:     Cervical: No cervical adenopathy.  Skin:    General: Skin is warm and dry.     Findings: No rash.  Neurological:     Mental Status: He is alert and oriented to person, place, and time.   Psychiatric:        Behavior: Behavior normal.        Thought Content: Thought content normal.        Judgment: Judgment normal.     Depression screen [facility] 2/9 05/21/2021 12/07/2020 11/05/2020 05/01/2020 12/31/2018  Decreased Interest 0 0 0 0 0  Down, Depressed, Hopeless 0 0 0 0 0  PHQ - 2 Score 0 0 0 0 0       Assessment & Plan:    Patient Active Problem List   Diagnosis Date Noted   Low serum HDL 12/31/2018   Elevated blood pressure reading 09/11/2018   Healthcare maintenance 09/05/2018   HIV disease ([place]) 06/22/2018   Low back pain 09/06/2016     Problem List Items Addressed This Visit       Other   HIV disease ([place]) - Primary    [name] is presumed to have well-controlled HIV with good adherence and tolerance to his ART regimen of Symtuza.  Awaiting new [place] to determine any potential resistance or cause for increasing viral load while on Cabenuva.  Discussed plan of care to include checking blood work today and awaiting [employer].  If no resistance is noted we will consider restarting Cabenuva on monthly injections or possibly returning to [place] or staying on [place] depending upon resistance if present.  Plan for follow-up in 3 months or sooner if needed pending [employer] results.       Relevant Medications   Darunavir-Cobicisctat-Emtricitabine-Tenofovir Alafenamide (SYMTUZA) 800-150-200-10 MG TABS   Other Relevant Orders   HIV-1 RNA quant-no reflex-bld   T-helper cells (CD4) count   Healthcare maintenance    Discussed importance of safe sexual practice to reduce risk of STI.  Condoms provided.      Other Visit Diagnoses     Screening for STDs (sexually transmitted diseases)       Relevant Orders   Urine cytology ancillary only([place])   RPR        I have discontinued Fortino H. Heater "Chris"'s Darun-Cobic-Emtricit-TenofAF (SYMTUZA PO). I am also having him start on Symtuza. Additionally, I am having him  maintain his albuterol and cabotegravir & rilpivirine ER.   Meds ordered this encounter  Medications   Darunavir-Cobicisctat-Emtricitabine-Tenofovir Alafenamide (SYMTUZA) 800-150-200-10 MG TABS    Sig: Take 1 tablet by mouth daily with breakfast.    Dispense:  30 tablet    Refill:  3    Order Specific Question:   Supervising Provider    Answer:   Carlyle Basques [[id number]]     Follow-up: Return in about 3 months (around 08/21/2021), or if symptoms worsen or fail to improve.   Terri Piedra, MSN, FNP-C Nurse Practitioner [facility] for Infectious Disease [place] number: [phone]

## 2021-05-21 NOTE — Patient Instructions (Signed)
Nice to see you.  We will check your lab work today.  Continue to take your medication.  Refills have sent to the pharmacy.  Plan for follow up in 3 months or sooner if needed.   Have a great day and stay safe!

## 2021-05-24 LAB — T-HELPER CELLS (CD4) COUNT (NOT AT ARMC)
Absolute CD4: 1443 cells/uL (ref 490–1740)
CD4 T Helper %: 42 % (ref 30–61)
Total lymphocyte count: 3423 cells/uL (ref 850–3900)

## 2021-05-24 LAB — URINE CYTOLOGY ANCILLARY ONLY
Chlamydia: NEGATIVE
Comment: NEGATIVE
Comment: NORMAL
Neisseria Gonorrhea: NEGATIVE

## 2021-05-24 LAB — HIV-1 RNA QUANT-NO REFLEX-BLD
HIV 1 RNA Quant: NOT DETECTED Copies/mL
HIV-1 RNA Quant, Log: NOT DETECTED Log cps/mL

## 2021-05-24 LAB — RPR: RPR Ser Ql: NONREACTIVE

## 2021-05-31 ENCOUNTER — Encounter: Payer: Self-pay | Admitting: Family

## 2021-07-05 ENCOUNTER — Encounter: Payer: Self-pay | Admitting: Infectious Diseases

## 2021-08-16 ENCOUNTER — Other Ambulatory Visit: Payer: Self-pay

## 2021-08-16 ENCOUNTER — Encounter: Payer: Self-pay | Admitting: Family

## 2021-08-16 ENCOUNTER — Ambulatory Visit (INDEPENDENT_AMBULATORY_CARE_PROVIDER_SITE_OTHER): Payer: Self-pay | Admitting: Family

## 2021-08-16 VITALS — BP 106/69 | HR 84 | Resp 16 | Ht 67.0 in | Wt 168.0 lb

## 2021-08-16 DIAGNOSIS — Z0000 Encounter for general adult medical examination without abnormal findings: Secondary | ICD-10-CM

## 2021-08-16 DIAGNOSIS — Z113 Encounter for screening for infections with a predominantly sexual mode of transmission: Secondary | ICD-10-CM

## 2021-08-16 DIAGNOSIS — B20 Human immunodeficiency virus [HIV] disease: Secondary | ICD-10-CM

## 2021-08-16 MED ORDER — SYMTUZA 800-150-200-10 MG PO TABS: 1.0000 | ORAL_TABLET | Freq: Every day | ORAL | 5 refills | Status: DC

## 2021-08-16 NOTE — Assessment & Plan Note (Signed)
   Discussed importance of safe sexual practices and condom usage.  Condoms offered.  Declines vaccinations.  Working on routine dental care.

## 2021-08-16 NOTE — Patient Instructions (Signed)
Nice to see you. ? ?We will check your lab work today. ? ?Continue to take your medication daily as prescribed. ? ?Refills have been sent to the pharmacy. ? ?Plan for follow up in 6 months or sooner if needed with lab work on the same day. ? ?Have a great day and stay safe! ? ?

## 2021-08-16 NOTE — Assessment & Plan Note (Signed)
Mr. Gurski continues to have well-controlled virus with good adherence and tolerance to his ART regimen of Symtuza.  No signs/symptoms of opportunistic infection.  We reviewed previous lab work and discussed plan of care.  Check blood work today.  Continue current dose of Symtuza.  Plan for follow-up in 6 months or sooner if needed with lab work on the same day.

## 2021-08-16 NOTE — Progress Notes (Signed)
Brief Narrative   Patient ID: Jay Smith, male    DOB: 10/01/1991, 30 y.o.   MRN: [id number]  Jay Smith is a 30 y/o AA gentleman diagnosed with HIV in September 2019 with risk factor of MSM. Initial viral load was 24,700 and CD4 count of 690. Genotype with no significant medication resistance mutations. Entered care at [facility] Stage 1. No history of opportunistic infection. [id number] negative. Previous ART experience with Biktarvy, and Cabenuva. Switched from [place] with concern for increasing viral load. Now on Symtuza. Follow up [employer] remains with no significant medication resistant mutations.   Subjective:    Chief Complaint  Patient presents with   HIV Positive/AIDS     HPI:  Jay Smith is a 30 y.o. male with HIV disease last seen on 05/21/2021 with well-controlled virus and good adherence and tolerance to his ART regimen of Symtuza.  [place] remains with no significant medication resistance mutations.  Viral load at the time was undetectable with CD4 count of 1443.  Here today for routine follow-up.  Jay Smith continues to take his Symtuza daily as prescribed with no adverse side effects.  Overall feeling well today with no new concerns/complaints. Denies fevers, chills, night sweats, headaches, changes in vision, neck pain/stiffness, nausea, diarrhea, vomiting, lesions or rashes.  Jay Smith has no problems obtaining medication from pharmacy remains covered through [employer] which is up-to-date. Is being down, depressed, hopeless recently.  Continues to drink alcohol rarely and smokes marijuana socially with no tobacco use.  Declines vaccinations.  Condoms offered.   Allergies  Allergen Reactions   Hydrocodone Swelling      Outpatient Medications Prior to Visit  Medication Sig Dispense Refill   Darunavir-Cobicisctat-Emtricitabine-Tenofovir Alafenamide (SYMTUZA) 800-150-200-10 MG TABS Take 1 tablet by mouth daily with breakfast. 30  tablet 3   albuterol (VENTOLIN HFA) 108 (90 Base) MCG/ACT inhaler Inhale 2 puffs into the lungs every 6 (six) hours as needed for wheezing or shortness of breath. (Patient not taking: Reported on 08/16/2021) 18 g 2   cabotegravir & rilpivirine ER (CABENUVA) 600 & 900 MG/3ML injection Inject 1 kit intramuscularly every 2 months. (Patient not taking: Reported on 08/16/2021) 6 mL 6   No facility-administered medications prior to visit.     Past Medical History:  Diagnosis Date   Asthma    Eczema    HIV infection ([place])      Past Surgical History:  Procedure Laterality Date   COSMETIC SURGERY     Great Toe Surgery      Review of Systems  Constitutional:  Negative for appetite change, chills, fatigue, fever and unexpected weight change.  Eyes:  Negative for visual disturbance.  Respiratory:  Negative for cough, chest tightness, shortness of breath and wheezing.   Cardiovascular:  Negative for chest pain and leg swelling.  Gastrointestinal:  Negative for abdominal pain, constipation, diarrhea, nausea and vomiting.  Genitourinary:  Negative for dysuria, flank pain, frequency, genital sores, hematuria and urgency.  Skin:  Negative for rash.  Allergic/Immunologic: Negative for immunocompromised state.  Neurological:  Negative for dizziness and headaches.     Objective:    BP 106/69   Pulse 84   Resp 16   Ht 5' 7"  (1.702 m)   Wt 168 lb (76.2 kg)   SpO2 97%   BMI 26.31 kg/m  Nursing note and vital signs reviewed.  Physical Exam Constitutional:      General: He is not in acute distress.    Appearance: He  is well-developed.  Eyes:     Conjunctiva/sclera: Conjunctivae normal.  Cardiovascular:     Rate and Rhythm: Normal rate and regular rhythm.     Heart sounds: Normal heart sounds. No murmur heard.   No friction rub. No gallop.  Pulmonary:     Effort: Pulmonary effort is normal. No respiratory distress.     Breath sounds: Normal breath sounds. No wheezing or rales.  Chest:      Chest wall: No tenderness.  Abdominal:     General: Bowel sounds are normal.     Palpations: Abdomen is soft.     Tenderness: There is no abdominal tenderness.  Musculoskeletal:     Cervical back: Neck supple.  Lymphadenopathy:     Cervical: No cervical adenopathy.  Skin:    General: Skin is warm and dry.     Findings: No rash.  Neurological:     Mental Status: He is alert and oriented to person, place, and time.  Psychiatric:        Behavior: Behavior normal.        Thought Content: Thought content normal.        Judgment: Judgment normal.     Depression screen [facility] 2/9 08/16/2021 05/21/2021 12/07/2020 11/05/2020 05/01/2020  Decreased Interest 0 0 0 0 0  Down, Depressed, Hopeless 0 0 0 0 0  PHQ - 2 Score 0 0 0 0 0       Assessment & Plan:    Patient Active Problem List   Diagnosis Date Noted   Low serum HDL 12/31/2018   Healthcare maintenance 09/05/2018   HIV disease ([place]) 06/22/2018     Problem List Items Addressed This Visit       Other   HIV disease ([place]) - Primary    Jay Smith continues to have well-controlled virus with good adherence and tolerance to his ART regimen of Symtuza.  No signs/symptoms of opportunistic infection.  We reviewed previous lab work and discussed plan of care.  Check blood work today.  Continue current dose of Symtuza.  Plan for follow-up in 6 months or sooner if needed with lab work on the same day.      Relevant Medications   Darunavir-Cobicistat-Emtricitabine-Tenofovir Alafenamide (SYMTUZA) 800-150-200-10 MG TABS   Other Relevant Orders   COMPLETE METABOLIC PANEL WITH GFR   HIV-1 RNA quant-no reflex-bld   T-helper cell (CD4)- ([facility] clinic only)   Healthcare maintenance    Discussed importance of safe sexual practices and condom usage.  Condoms offered. Declines vaccinations. Working on routine dental care.      Other Visit Diagnoses     Screening for STDs (sexually transmitted diseases)       Relevant Orders   RPR         I have discontinued Jay Smith "Chris"'s albuterol and cabotegravir & rilpivirine ER. I have also changed his Symtuza.   Meds ordered this encounter  Medications   Darunavir-Cobicistat-Emtricitabine-Tenofovir Alafenamide (SYMTUZA) 800-150-200-10 MG TABS    Sig: Take 1 tablet by mouth daily with breakfast.    Dispense:  30 tablet    Refill:  5    Order Specific Question:   Supervising Provider    Answer:   Carlyle Basques [[id number]]      Follow-up: Return in about 6 months (around 02/13/2022), or if symptoms worsen or fail to improve.   Terri Piedra, MSN, FNP-C Nurse Practitioner [facility] for Infectious Disease [place] number: [phone]

## 2021-08-17 LAB — T-HELPER CELL (CD4) - (RCID CLINIC ONLY)
CD4 % Helper T Cell: 35 % (ref 33–65)
CD4 T Cell Abs: 970 /uL (ref 400–1790)

## 2021-08-18 LAB — COMPLETE METABOLIC PANEL WITH GFR
AG Ratio: 1.5 (calc) (ref 1.0–2.5)
ALT: 18 U/L (ref 9–46)
AST: 19 U/L (ref 10–40)
Albumin: 4.5 g/dL (ref 3.6–5.1)
Alkaline phosphatase (APISO): 65 U/L (ref 36–130)
BUN: 14 mg/dL (ref 7–25)
CO2: 25 mmol/L (ref 20–32)
Calcium: 9.3 mg/dL (ref 8.6–10.3)
Chloride: 106 mmol/L (ref 98–110)
Creat: 1.07 mg/dL (ref 0.60–1.26)
Globulin: 3.1 g/dL (calc) (ref 1.9–3.7)
Glucose, Bld: 120 mg/dL — ABNORMAL HIGH (ref 65–99)
Potassium: 4.5 mmol/L (ref 3.5–5.3)
Sodium: 138 mmol/L (ref 135–146)
Total Bilirubin: 0.3 mg/dL (ref 0.2–1.2)
Total Protein: 7.6 g/dL (ref 6.1–8.1)
eGFR: 96 mL/min/{1.73_m2} (ref >=60)

## 2021-08-18 LAB — HIV-1 RNA QUANT-NO REFLEX-BLD
HIV 1 RNA Quant: <20 Copies/mL — ABNORMAL HIGH
HIV-1 RNA Quant, Log: <1.3 Log cps/mL — ABNORMAL HIGH

## 2021-08-18 LAB — RPR: RPR Ser Ql: REACTIVE — AB

## 2021-08-18 LAB — RPR TITER: RPR Titer: 1:1 {titer} — ABNORMAL HIGH

## 2021-08-18 LAB — FLUORESCENT TREPONEMAL AB(FTA)-IGG-BLD: Fluorescent Treponemal ABS: REACTIVE — AB

## 2022-01-20 ENCOUNTER — Telehealth: Payer: Self-pay

## 2022-01-20 ENCOUNTER — Other Ambulatory Visit: Payer: Self-pay

## 2022-01-20 ENCOUNTER — Ambulatory Visit: Payer: Commercial Managed Care - PPO

## 2022-01-20 ENCOUNTER — Other Ambulatory Visit (HOSPITAL_COMMUNITY): Payer: Self-pay

## 2022-01-20 DIAGNOSIS — B20 Human immunodeficiency virus [HIV] disease: Secondary | ICD-10-CM

## 2022-01-20 MED ORDER — SYMTUZA 800-150-200-10 MG PO TABS: 1.0000 | ORAL_TABLET | Freq: Every day | ORAL | 1 refills | Status: DC

## 2022-01-20 NOTE — Telephone Encounter (Signed)
[facility] Patient Advocate Encounter ?  ?Was successful in obtaining a [place] copay card for [employer].  This copay card will make the patients copay $0.00. ? ?I have spoken with the patient.   ? ?The billing information is as follows and has been shared with [employer]. ? ? ? ? ? ? ?Clearance Coots, CPhT ?Specialty Pharmacy Patient Advocate ?[facility] ?Phone: [phone] ?Fax:  [phone]  ?

## 2022-02-21 ENCOUNTER — Ambulatory Visit: Payer: Self-pay | Admitting: Family

## 2022-02-23 ENCOUNTER — Other Ambulatory Visit: Payer: Self-pay

## 2022-02-23 ENCOUNTER — Encounter: Payer: Self-pay | Admitting: Family

## 2022-02-23 ENCOUNTER — Ambulatory Visit (INDEPENDENT_AMBULATORY_CARE_PROVIDER_SITE_OTHER): Payer: Commercial Managed Care - PPO | Admitting: Family

## 2022-02-23 VITALS — BP 122/82 | HR 59 | Resp 16 | Ht 67.0 in | Wt 171.6 lb

## 2022-02-23 DIAGNOSIS — B20 Human immunodeficiency virus [HIV] disease: Secondary | ICD-10-CM

## 2022-02-23 DIAGNOSIS — Z113 Encounter for screening for infections with a predominantly sexual mode of transmission: Secondary | ICD-10-CM | POA: Diagnosis not present

## 2022-02-23 DIAGNOSIS — Z0000 Encounter for general adult medical examination without abnormal findings: Secondary | ICD-10-CM

## 2022-02-23 MED ORDER — SYMTUZA 800-150-200-10 MG PO TABS: 1.0000 | ORAL_TABLET | Freq: Every day | ORAL | 6 refills | Status: DC

## 2022-02-23 NOTE — Patient Instructions (Addendum)
Nice to see you. ? ?We will check your lab work today. ? ?Continue to take your medication daily as prescribed. ? ?Refills have been sent to the pharmacy. ? ?Plan for follow up in 6 months or sooner if needed with lab work on the same day. ? ?Have a great day and stay safe! ? ?

## 2022-02-23 NOTE — Assessment & Plan Note (Signed)
Mr. Strickling continues to have well-controlled virus with good adherence and tolerance to his ART regimen of Symtuza.  Reviewed previous lab work and discussed plan of care.  Check blood work today.  Continue current dose of Symtuza.  Considering possible return to [place].  Plan for follow-up in 6 months or sooner if needed with lab work on the same day. ?

## 2022-02-23 NOTE — Assessment & Plan Note (Signed)
?   Discussed importance of safe sexual practices and condom use.  Condoms offered. ?? Declines vaccinations. ?? He will schedule routine dental care independently and can refer to [facility] if necessary. ?

## 2022-02-23 NOTE — Progress Notes (Signed)
? ? ?Brief Narrative  ? ?Patient ID: CONNERY SHIFFLER, male    DOB: 01/09/1991, 31 y.o.   MRN: [id number] ? ?Mr. Hildreth is a 31 y/o AA gentleman diagnosed with HIV in September 2019 with risk factor of MSM. Initial viral load was 24,700 and CD4 count of 690. Genotype with no significant medication resistance mutations. Entered care at [facility] Stage 1. No history of opportunistic infection. [id number] negative. Previous ART experience with Biktarvy, and Cabenuva. Switched from [place] with concern for increasing viral load. Now on Symtuza. Follow up [employer] remains with no significant medication resistant mutations.  ? ?Subjective:  ?  ?Chief Complaint  ?Patient presents with  ? HIV Positive/AIDS  ? ? ?HPI: ? ?JAROM GOVAN is a 31 y.o. male with HIV disease last seen on 08/16/21 with well controlled virus and good adherence and tolerance to Symtuza. Viral load was undetectable and CD4 count 970. Kidney function, liver function, and electrolytes were within normal limits. Here today for routine follow up.  ? ?Mr. Anne continues to take his Symtuza daily as prescribed with no adverse side effects.  Feeling well today with no new concerns/complaints. Denies fevers, chills, night sweats, headaches, changes in vision, neck pain/stiffness, nausea, diarrhea, vomiting, lesions or rashes. ? ?Mr. Whidby had a challenge getting his medication from the pharmacy, however we were able to obtain a co-pay card and the co-pay should not be $0.  Denies feelings of being down, depressed, or hopeless recently.  Drinks alcohol on occasion with no current recreational illicit drug use or tobacco use.  Condoms offered.  Healthcare maintenance due includes Prevnar 20 and routine dental care. ? ? ?Allergies  ?Allergen Reactions  ? Hydrocodone Swelling  ? ? ? ? ?Outpatient Medications Prior to Visit  ?Medication Sig Dispense Refill  ? Darunavir-Cobicistat-Emtricitabine-Tenofovir Alafenamide (SYMTUZA) 800-150-200-10 MG TABS  Take 1 tablet by mouth daily with breakfast. 30 tablet 1  ? ?No facility-administered medications prior to visit.  ? ? ? ?Past Medical History:  ?Diagnosis Date  ? Asthma   ? Eczema   ? HIV infection (HCC)   ? ? ? ?Past Surgical History:  ?Procedure Laterality Date  ? COSMETIC SURGERY    ? Great Toe Surgery  ? ? ? ? ?Review of Systems  ?Constitutional:  Negative for appetite change, chills, fatigue, fever and unexpected weight change.  ?Eyes:  Negative for visual disturbance.  ?Respiratory:  Negative for cough, chest tightness, shortness of breath and wheezing.   ?Cardiovascular:  Negative for chest pain and leg swelling.  ?Gastrointestinal:  Negative for abdominal pain, constipation, diarrhea, nausea and vomiting.  ?Genitourinary:  Negative for dysuria, flank pain, frequency, genital sores, hematuria and urgency.  ?Skin:  Negative for rash.  ?Allergic/Immunologic: Negative for immunocompromised state.  ?Neurological:  Negative for dizziness and headaches.  ?   ?Objective:  ?  ?BP 122/82   Pulse (!) 59   Resp 16   Ht 5\' 7"  (1.702 m)   Wt 171 lb 9.6 oz (77.8 kg)   SpO2 99%   BMI 26.88 kg/m?  ?Nursing note and vital signs reviewed. ? ?Physical Exam ?Constitutional:   ?   General: He is not in acute distress. ?   Appearance: He is well-developed.  ?Eyes:  ?   Conjunctiva/sclera: Conjunctivae normal.  ?Cardiovascular:  ?   Rate and Rhythm: Normal rate and regular rhythm.  ?   Heart sounds: Normal heart sounds. No murmur heard. ?  No friction rub. No gallop.  ?Pulmonary:  ?  Effort: Pulmonary effort is normal. No respiratory distress.  ?   Breath sounds: Normal breath sounds. No wheezing or rales.  ?Chest:  ?   Chest wall: No tenderness.  ?Abdominal:  ?   General: Bowel sounds are normal.  ?   Palpations: Abdomen is soft.  ?   Tenderness: There is no abdominal tenderness.  ?Musculoskeletal:  ?   Cervical back: Neck supple.  ?Lymphadenopathy:  ?   Cervical: No cervical adenopathy.  ?Skin: ?   General: Skin is warm  and dry.  ?   Findings: No rash.  ?Neurological:  ?   Mental Status: He is alert and oriented to person, place, and time.  ?Psychiatric:     ?   Behavior: Behavior normal.     ?   Thought Content: Thought content normal.     ?   Judgment: Judgment normal.  ? ? ? ? ?  02/23/2022  ?  2:54 PM 08/16/2021  ?  9:01 AM 05/21/2021  ?  8:37 AM 12/07/2020  ?  4:13 PM 11/05/2020  ?  4:24 PM  ?Depression screen PHQ 2/9  ?Decreased Interest 0 0 0 0 0  ?Down, Depressed, Hopeless 0 0 0 0 0  ?PHQ - 2 Score 0 0 0 0 0  ?  ?   ?Assessment & Plan:  ? ? ?Patient Active Problem List  ? Diagnosis Date Noted  ? 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 Healthcare maintenance 09/05/2018  ? HIV disease (HCC) 06/22/2018  ? ? ? ?Problem List Items Addressed This Visit   ? ?  ? Other  ? HIV disease (HCC) - Primary  ?  Mr. Szczesniak continues to have well-controlled virus with good adherence and tolerance to his ART regimen of Symtuza.  Reviewed previous lab work and discussed plan of care.  Check blood work today.  Continue current dose of Symtuza.  Considering possible return to [place].  Plan for follow-up in 6 months or sooner if needed with lab work on the same day. ? ?  ?  ? Relevant Medications  ? Darunavir-Cobicistat-Emtricitabine-Tenofovir Alafenamide (SYMTUZA) 800-150-200-10 MG TABS  ? Other Relevant Orders  ? HIV-1 RNA quant-no reflex-bld  ? T-helper cell (CD4)- ([facility] clinic only)  ? Comprehensive metabolic panel  ? Healthcare maintenance  ?  Discussed importance of safe sexual practices and condom use.  Condoms offered. ?Declines vaccinations. ?He will schedule routine dental care independently and can refer to [facility] if necessary. ?  ?  ? ?Other Visit Diagnoses   ? ? Screening for STDs (sexually transmitted diseases)      ? Relevant Orders  ? RPR  ? ?  ? ? ? ?I am having Kailon H. Ritthaler "[name]" maintain his Symtuza. ? ? ?Meds ordered this encounter  ?Medications  ? Darunavir-Cobicistat-Emtricitabine-Tenofovir Alafenamide (SYMTUZA) 800-150-200-10 MG  TABS  ?  Sig: Take 1 tablet by mouth daily with breakfast.  ?  Dispense:  30 tablet  ?  Refill:  6  ?  Order Specific Question:   Supervising Provider  ?  Answer:   Judyann Munson [[id number]]  ? ? ? ?Follow-up: Return in about 6 months (around 08/26/2022), or if symptoms worsen or fail to improve. ? ? ?Marcos Eke, MSN, FNP-C ?Nurse Practitioner ?[facility] ?[facility] ?[facility] Main number: [phone] ? ? ?

## 2022-02-25 LAB — T-HELPER CELL (CD4) - (RCID CLINIC ONLY)
CD4 % Helper T Cell: 40 % (ref 33–65)
CD4 T Cell Abs: 948 /uL (ref 400–1790)

## 2022-02-28 LAB — COMPREHENSIVE METABOLIC PANEL
AG Ratio: 1.3 (calc) (ref 1.0–2.5)
ALT: 12 U/L (ref 9–46)
AST: 12 U/L (ref 10–40)
Albumin: 4.4 g/dL (ref 3.6–5.1)
Alkaline phosphatase (APISO): 68 U/L (ref 36–130)
BUN: 15 mg/dL (ref 7–25)
CO2: 25 mmol/L (ref 20–32)
Calcium: 9.8 mg/dL (ref 8.6–10.3)
Chloride: 105 mmol/L (ref 98–110)
Creat: 1.19 mg/dL (ref 0.60–1.26)
Globulin: 3.3 g/dL (calc) (ref 1.9–3.7)
Glucose, Bld: 101 mg/dL — ABNORMAL HIGH (ref 65–99)
Potassium: 4.2 mmol/L (ref 3.5–5.3)
Sodium: 139 mmol/L (ref 135–146)
Total Bilirubin: 0.4 mg/dL (ref 0.2–1.2)
Total Protein: 7.7 g/dL (ref 6.1–8.1)

## 2022-02-28 LAB — HIV-1 RNA QUANT-NO REFLEX-BLD
HIV 1 RNA Quant: NOT DETECTED copies/mL
HIV-1 RNA Quant, Log: NOT DETECTED Log copies/mL

## 2022-02-28 LAB — FLUORESCENT TREPONEMAL AB(FTA)-IGG-BLD: Fluorescent Treponemal ABS: REACTIVE — AB

## 2022-02-28 LAB — RPR: RPR Ser Ql: REACTIVE — AB

## 2022-02-28 LAB — RPR TITER: RPR Titer: 1:1 {titer} — ABNORMAL HIGH

## 2022-04-08 ENCOUNTER — Telehealth: Payer: Self-pay

## 2022-04-08 ENCOUNTER — Other Ambulatory Visit (HOSPITAL_COMMUNITY): Payer: Self-pay

## 2022-04-08 NOTE — Telephone Encounter (Signed)
[facility] Patient Advocate Encounter   I was successful in securing patient a $7500.00 grant from [employer] ([employer]) to provide copayment coverage for Symtuza.  This will make the out of pocket cost $0.00.     I have spoken with the patient.    The billing information is as follows and has been shared with [employer].        Patient knows to call the office with questions or concerns.  Clearance Coots, CPhT Specialty Pharmacy Patient [facility] for Infectious Disease Phone: [phone] Fax:  [phone]

## 2022-05-12 ENCOUNTER — Other Ambulatory Visit: Payer: Self-pay | Admitting: Family

## 2022-05-12 ENCOUNTER — Other Ambulatory Visit (HOSPITAL_COMMUNITY): Payer: Self-pay

## 2022-05-12 DIAGNOSIS — B20 Human immunodeficiency virus [HIV] disease: Secondary | ICD-10-CM

## 2022-05-12 MED ORDER — DOVATO 50-300 MG PO TABS: 1.0000 | ORAL_TABLET | Freq: Every day | ORAL | 5 refills | Status: DC

## 2022-05-12 NOTE — Progress Notes (Signed)
Medication change secondary to insurance coverage.

## 2022-05-18 ENCOUNTER — Ambulatory Visit: Payer: Commercial Managed Care - PPO | Admitting: Family

## 2022-05-19 ENCOUNTER — Encounter: Payer: Self-pay | Admitting: Family

## 2022-05-19 ENCOUNTER — Other Ambulatory Visit: Payer: Self-pay

## 2022-05-19 ENCOUNTER — Other Ambulatory Visit (HOSPITAL_COMMUNITY): Payer: Self-pay

## 2022-05-19 ENCOUNTER — Ambulatory Visit (INDEPENDENT_AMBULATORY_CARE_PROVIDER_SITE_OTHER): Payer: Commercial Managed Care - PPO | Admitting: Family

## 2022-05-19 VITALS — BP 115/74 | HR 65 | Resp 16 | Ht 67.0 in | Wt 170.0 lb

## 2022-05-19 DIAGNOSIS — T7840XA Allergy, unspecified, initial encounter: Secondary | ICD-10-CM | POA: Diagnosis not present

## 2022-05-19 DIAGNOSIS — B20 Human immunodeficiency virus [HIV] disease: Secondary | ICD-10-CM | POA: Diagnosis not present

## 2022-05-19 MED ORDER — SYMTUZA 800-150-200-10 MG PO TABS
1.0000 | ORAL_TABLET | Freq: Every day | ORAL | 0 refills | Status: DC
Start: 2022-05-19 — End: 2022-06-09

## 2022-05-19 NOTE — Assessment & Plan Note (Signed)
Jay Smith appears to have signs/symptoms concerning for hypersensitivity reaction to Dovato. Adverse side effects are mostly resolved with the exception of headache which is improving. Appropriately stopped taking medication. Has previous taken Biktarvy with no problems and Dolutegravir and Biktegravir are very similar molecules and would expect that he is either sensitive to lamividuine or some other component of Dovato. Will place on allergy list. Continue symptomatic management and supportive care.

## 2022-05-19 NOTE — Progress Notes (Signed)
Brief Narrative   Patient ID: Jay Smith, male    DOB: [birth year], 31 y.o.   MRN: [id number]  Jay Smith is a 31 y/o AA gentleman diagnosed with HIV in September 2019 with risk factor of MSM. Initial viral load was 24,700 and CD4 count of 690. Genotype with no significant medication resistance mutations. Entered care at [facility] Stage 1. No history of opportunistic infection. [id number] negative. Previous ART experience with Biktarvy, and Cabenuva. Switched from [place] with concern for increasing viral load. Now on Symtuza. Follow up [employer] remains with no significant medication resistant mutations.   Subjective:    Chief Complaint  Patient presents with   Follow-up    HPI:  Jay Smith is a 31 y.o. male with HIV disease last seen on 02/23/22 with good adherence and tolerance to his ART regimen of Symtuza presenting today for an acute office visit.   Jay Smith was notified by his insurance that it would no longer cover Symtuza as he had run out of copay assistance. Dovato was covered by his insurance and was sent to the pharmacy. Within 2 doses of medication began having adverse side effects concerning for hypersensitivity reaction initially with diarrhea which progressed to headache, skin peeling on his face, and swollen gums. Immediately stopped medication. Symptoms have improved with the exception of headache which is still present but improving. No other fevers, chills, rashes, or lesions currently present.   Allergies  Allergen Reactions   Dovato [Dolutegravir-Lamivudine] Other (See Comments)    Hypersensitivity reaction with diarrhea, skin peeling and headache.    Hydrocodone Swelling      Outpatient Medications Prior to Visit  Medication Sig Dispense Refill   acetaminophen (MAPAP) 500 MG tablet      ibuprofen (ADVIL) 600 MG tablet      dolutegravir-lamiVUDine (DOVATO) 50-300 MG tablet Take 1 tablet by mouth daily. 30 tablet 5    oxyCODONE-acetaminophen (PERCOCET/ROXICET) 5-325 MG tablet TK 1 T PO Q 6 H PRF SEVERE PAIN     predniSONE (DELTASONE) 20 MG tablet  (Patient not taking: Reported on 05/19/2022)     No facility-administered medications prior to visit.     Past Medical History:  Diagnosis Date   Asthma    Eczema    HIV infection (HCC)      Past Surgical History:  Procedure Laterality Date   COSMETIC SURGERY     Great Toe Surgery      Review of Systems  Constitutional:  Negative for appetite change, chills, fatigue, fever and unexpected weight change.  Eyes:  Negative for visual disturbance.  Respiratory:  Negative for cough, chest tightness, shortness of breath and wheezing.   Cardiovascular:  Negative for chest pain and leg swelling.  Gastrointestinal:  Negative for abdominal pain, constipation, diarrhea, nausea and vomiting.  Genitourinary:  Negative for dysuria, flank pain, frequency, genital sores, hematuria and urgency.  Skin:  Negative for rash.  Allergic/Immunologic: Negative for immunocompromised state.  Neurological:  Negative for dizziness and headaches.      Objective:    BP 115/74   Pulse 65   Resp 16   Ht 5\' 7"  (1.702 m)   Wt 170 lb (77.1 kg)   SpO2 99%   BMI 26.63 kg/m  Nursing note and vital signs reviewed.  Physical Exam Constitutional:      General: He is not in acute distress.    Appearance: He is well-developed.  Eyes:     Conjunctiva/sclera: Conjunctivae normal.  Cardiovascular:  Rate and Rhythm: Normal rate and regular rhythm.     Heart sounds: Normal heart sounds. No murmur heard.    No friction rub. No gallop.  Pulmonary:     Effort: Pulmonary effort is normal. No respiratory distress.     Breath sounds: Normal breath sounds. No wheezing or rales.  Chest:     Chest wall: No tenderness.  Abdominal:     General: Bowel sounds are normal.     Palpations: Abdomen is soft.     Tenderness: There is no abdominal tenderness.  Musculoskeletal:      Cervical back: Neck supple.  Lymphadenopathy:     Cervical: No cervical adenopathy.  Skin:    General: Skin is warm and dry.     Findings: No rash.  Neurological:     Mental Status: He is alert and oriented to person, place, and time.  Psychiatric:        Behavior: Behavior normal.        Thought Content: Thought content normal.        Judgment: Judgment normal.         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 Unfortunately he is over income limits for UMAP and ICAP. Other medications checked will go through copay cards within 2-3 months based on current insurance coverage. He will check with his HR Department about other options. We will look into Cabenuva to see if that is covered or possibly seeking a medical exemption from his insurance due to his hypersensitivity reaction to the only medication that appears to be covered which is Dovato.       Relevant Medications   Darunavir-Cobicistat-Emtricitabine-Tenofovir Alafenamide (SYMTUZA) 800-150-200-10 MG TABS   Hypersensitivity reaction - Primary    Jay Smith appears to have signs/symptoms concerning for hypersensitivity reaction to Dovato. Adverse side effects are mostly resolved with the exception of headache which is improving. Appropriately stopped taking medication. Has previous taken Biktarvy  with no problems and Dolutegravir and Biktegravir are very similar molecules and would expect that he is either sensitive to lamividuine or some other component of Dovato. Will place on allergy list. Continue symptomatic management and supportive care.         I have discontinued Jay Smith. Jay "Chris"'s Dovato, oxyCODONE-acetaminophen, and predniSONE. I am also having him start on Symtuza. Additionally, I am having him maintain his acetaminophen and ibuprofen.   Meds ordered this encounter  Medications   Darunavir-Cobicistat-Emtricitabine-Tenofovir Alafenamide (SYMTUZA) 800-150-200-10 MG TABS    Sig: Take 1 tablet by mouth daily with breakfast.    Dispense:  30 tablet    Refill:  0  Order Specific Question:   Supervising Provider    Answer:   Jay Smith [phone]     Follow-up: As planned for routine follow up pending medication approval as needed.    Jay Eke, MSN, FNP-C Nurse Practitioner [facility] for Infectious Disease [facility] Main number: [phone]

## 2022-05-19 NOTE — Assessment & Plan Note (Signed)
Jay Smith was given a 30 day sample of Symtuza while working through finding alternative medication that his insurance will cover. Unfortunately he is over income limits for UMAP and ICAP. Other medications checked will go through copay cards within 2-3 months based on current insurance coverage. He will check with his HR Department about other options. We will look into Cabenuva to see if that is covered or possibly seeking a medical exemption from his insurance due to his hypersensitivity reaction to the only medication that appears to be covered which is Dovato.

## 2022-05-20 ENCOUNTER — Telehealth: Payer: Self-pay

## 2022-05-20 ENCOUNTER — Other Ambulatory Visit (HOSPITAL_COMMUNITY): Payer: Self-pay

## 2022-05-20 NOTE — Telephone Encounter (Signed)
[facility] Patient Advocate Encounter  Jay Smith is not covered under the patient pharmacy and medical plan no PA required .   I will submit a application to [employer] for patient assistance.  Jay Smith, CPhT Specialty Pharmacy Patient [facility] for Infectious Disease Phone: [phone] Fax:  [phone]

## 2022-05-24 ENCOUNTER — Other Ambulatory Visit: Payer: Self-pay | Admitting: Pharmacist

## 2022-05-24 MED ORDER — SYMTUZA 800-150-200-10 MG PO TABS: 1.0000 | ORAL_TABLET | Freq: Every day | ORAL | 0 refills | Status: DC

## 2022-05-24 NOTE — Progress Notes (Signed)
Medication Samples have been provided to the patient.  Drug name: Symtuza        Strength: 800/150/200/10 mg Qty: 30  Tablets (1 bottles) LOT: [id number]   Exp.Date: 4/25  Dosing instructions: Take one tablet by mouth once daily with food  The patient has been instructed regarding the correct time, dose, and frequency of taking this medication, including desired effects and most common side effects.   Sierra Spargo, PharmD, CPP, BCIDP Clinical Pharmacist Practitioner Infectious Diseases Clinical Pharmacist [facility]  

## 2022-06-06 ENCOUNTER — Telehealth: Payer: Self-pay

## 2022-06-06 NOTE — Telephone Encounter (Signed)
[facility] Patient Advocate Encounter  Completed and sent [employer] Patient Assistance application for Cabenuva for this patient who is uninsured.    Patient is approved 06/06/22 through 06/06/23.  Medication will be delivered to the clinic.   Clearance Coots, CPhT Specialty Pharmacy Patient [facility] for Infectious Disease Phone: [phone] Fax:  [phone]

## 2022-06-08 ENCOUNTER — Telehealth: Payer: Self-pay | Admitting: Pharmacist

## 2022-06-08 NOTE — Telephone Encounter (Signed)
Patient's specialty medication [name]) was delivered from [facility] and will be administered at next office visit.  Margarite Gouge, PharmD, CPP, BCIDP Clinical Pharmacist Practitioner Infectious Diseases Clinical Pharmacist [facility] for Infectious Disease

## 2022-06-09 ENCOUNTER — Ambulatory Visit (INDEPENDENT_AMBULATORY_CARE_PROVIDER_SITE_OTHER): Payer: Commercial Managed Care - PPO | Admitting: Pharmacist

## 2022-06-09 ENCOUNTER — Other Ambulatory Visit: Payer: Self-pay

## 2022-06-09 DIAGNOSIS — B20 Human immunodeficiency virus [HIV] disease: Secondary | ICD-10-CM

## 2022-06-09 MED ORDER — CABENUVA 400 & 600 MG/2ML IM SUER: 1.0000 | INTRAMUSCULAR | 0 refills | Status: DC

## 2022-06-09 MED ORDER — CABOTEGRAVIR & RILPIVIRINE ER 600 & 900 MG/3ML IM SUER
1.0000 | Freq: Once | INTRAMUSCULAR | Status: AC
  Administered 2022-06-09: 1 via INTRAMUSCULAR

## 2022-06-09 NOTE — Progress Notes (Signed)
HPI: Jay Smith is a 31 y.o. male who presents to [facility] for [place] administration.  Patient Active Problem List   Diagnosis Date Noted   Hypersensitivity reaction 05/19/2022   Asthma 03/24/2021   Low serum HDL 12/31/2018   Healthcare maintenance 09/05/2018   HIV disease ([place]) 06/22/2018    Patient's Medications  New Prescriptions   CABOTEGRAVIR & RILPIVIRINE ER (CABENUVA) 400 & 600 MG/2ML INJECTION    Inject 1 kit into the muscle every 30 (thirty) days.  Previous Medications   ACETAMINOPHEN (MAPAP) 500 MG TABLET       IBUPROFEN (ADVIL) 600 MG TABLET      Modified Medications   No medications on file  Discontinued Medications   DARUNAVIR-COBICISTAT-EMTRICITABINE-TENOFOVIR ALAFENAMIDE (SYMTUZA) 800-150-200-10 MG TABS    Take 1 tablet by mouth daily with breakfast.   DARUNAVIR-COBICISTAT-EMTRICITABINE-TENOFOVIR ALAFENAMIDE (SYMTUZA) 800-150-200-10 MG TABS    Take 1 tablet by mouth daily with breakfast.    Allergies: Allergies  Allergen Reactions   Dovato [Dolutegravir-Lamivudine] Other (See Comments)    Hypersensitivity reaction with diarrhea, skin peeling and headache.    Hydrocodone Swelling    Past Medical History: Past Medical History:  Diagnosis Date   Asthma    Eczema    HIV infection ([place])     Social History: Social History   Socioeconomic History   Marital status: Single    Spouse name: Not on file   Number of children: Not on file   Years of education: Not on file   Highest education level: Not on file  Occupational History   Occupation: Information systems manager  Tobacco Use   Smoking status: Never   Smokeless tobacco: Never  Vaping Use   Vaping Use: Never used  Substance and Sexual Activity   Alcohol use: Yes    Alcohol/week: 0.0 standard drinks of alcohol    Comment: rare   Drug use: Not Currently    Types: Marijuana    Comment: socially   Sexual activity: Not Currently    Partners: Male    Birth control/protection:  Condom    Comment: declined condoms  Other Topics Concern   Not on file  Social History Narrative   Not on file   Social Determinants of Health   Financial Resource Strain: Not on file  Food Insecurity: Not on file  Transportation Needs: Not on file  Physical Activity: Not on file  Stress: Not on file  Social Connections: Not on file    Labs: Lab Results  Component Value Date   HIV1RNAQUANT NOT DETECTED 02/23/2022   HIV1RNAQUANT <20 (H) 08/16/2021   HIV1RNAQUANT Not Detected 05/21/2021   CD4TABS 948 02/23/2022   CD4TABS 970 08/16/2021   CD4TABS 1,075 01/04/2021    RPR and STI Lab Results  Component Value Date   LABRPR REACTIVE (A) 02/23/2022   LABRPR REACTIVE (A) 08/16/2021   LABRPR NON-REACTIVE 05/21/2021   LABRPR REACTIVE (A) 12/07/2020   LABRPR REACTIVE (A) 09/07/2020   RPRTITER 1:1 (H) 02/23/2022   RPRTITER 1:1 (H) 08/16/2021   RPRTITER 1:1 (H) 12/07/2020   RPRTITER 1:4 (H) 09/07/2020    STI Results GC CT  05/21/2021  8:59 AM Negative  Negative   12/07/2020  4:19 PM Negative  Negative   06/01/2018 12:00 AM Negative  Negative   02/12/2015 12:00 AM Negative  Negative     Hepatitis B Lab Results  Component Value Date   HEPBSAB BORDERLINE (A) 06/01/2018   HEPBSAG NON-REACTIVE 06/01/2018   HEPBCAB NON-REACTIVE 06/01/2018  Hepatitis C Lab Results  Component Value Date   HEPCAB NON-REACTIVE 06/01/2018   Hepatitis A Lab Results  Component Value Date   HAV NON-REACTIVE 06/01/2018   Lipids: Lab Results  Component Value Date   CHOL 193 04/18/2019   TRIG 234 (H) 04/18/2019   HDL 31 (L) 04/18/2019   CHOLHDL 6.2 (H) 04/18/2019   LDLCALC 124 (H) 04/18/2019    Current HIV Regimen: Symtuza  TARGET DATE: The 31st of the month  Assessment: Jay Smith presents today for their first initiation injection for Cabenuva. He previously received Cabenuva from December 2021 to May 2022 and was switched to [facility] after concern for low-level viremia while on the  injections. His insurance will no longer cover Symtuza, so Greg recommended trialing Dovato as [employer] did not detect any INSTI resistance, but unfortunately Jay Smith could not tolerate Dovato. Marya Amsler then recommended to switch back to [place] as it could be approved through [employer]. Will restart Jay Smith on [place] with his loading dose today and maintain monthly injections as compared to prior bimonthly injections.   Counseled that [place] is two separate intramuscular injections in the gluteal muscle on each side for each visit. Explained that the second injection is 30 days after the initial injection then every 2 months thereafter. Discussed the need for viral load monitoring every 2 months for the first 6 months and then periodically afterwards as their provider sees the need. Discussed the rare but significant chance of developing resistance despite compliance. Explained that showing up to injection appointments is very important and warned that if 2 appointments are missed, it will be reassessed by their provider whether they are a good candidate for injection therapy. Counseled on possible side effects associated with the injections such as injection site pain, which is usually mild to moderate in nature, injection site nodules, and injection site reactions. Asked to call the clinic or send me a mychart message if they experience any issues, such as fatigue, nausea, headache, rash, or dizziness. Advised that they can take ibuprofen or tylenol for injection site pain if needed.   Administered cabotegravir 647m/3mL in left upper outer quadrant of the gluteal muscle. Administered rilpivirine 900 mg/358min the right upper outer quadrant of the gluteal muscle. Monitored patient for 10 minutes after injection. Injections were tolerated well without issue. Counseled to stop taking Symtuza after today's dose and to call with any issues that may arise. Will make follow up appointments for second initiation  injection in 30 days and then maintenance injections every 2 months thereafter for 6 months.   Plan: - Stop Symtuza - First Cabenuva injections administered - Second initiation injection scheduled for 9/28 with me  - Maintenance injections scheduled for 11/2 with GrMarya Amslernd 11/30 with me  - Call with any issues or questions  AmAlfonse SprucePharmD, CPP, BCMarblelinical Pharmacist Practitioner [facility]

## 2022-06-23 ENCOUNTER — Telehealth: Payer: Self-pay

## 2022-06-23 NOTE — Telephone Encounter (Signed)
[facility] Patient Advocate Encounter  Patient's medications Cabenuva 400 mg/16ml [facility] [phone]) have been couriered to [facility] from [employer]: [phone], and will be administered on 07/07/2022.

## 2022-07-07 ENCOUNTER — Other Ambulatory Visit: Payer: Self-pay

## 2022-07-07 ENCOUNTER — Ambulatory Visit (INDEPENDENT_AMBULATORY_CARE_PROVIDER_SITE_OTHER): Payer: Commercial Managed Care - PPO | Admitting: Pharmacist

## 2022-07-07 DIAGNOSIS — B20 Human immunodeficiency virus [HIV] disease: Secondary | ICD-10-CM

## 2022-07-07 MED ORDER — CABOTEGRAVIR & RILPIVIRINE ER 400 & 600 MG/2ML IM SUER
1.0000 | Freq: Once | INTRAMUSCULAR | Status: AC
  Administered 2022-07-07: 1 via INTRAMUSCULAR

## 2022-07-07 NOTE — Progress Notes (Signed)
HPI: Jay Smith is a 31 y.o. male who presents to the [facility] for [place] administration.  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   IBUPROFEN (ADVIL) 600 MG TABLET      Modified Medications   No medications on file  Discontinued Medications   No medications on file    Allergies: Allergies  Allergen Reactions   Dovato [Dolutegravir-Lamivudine] Other (See Comments)    Hypersensitivity reaction with diarrhea, skin peeling and headache.    Hydrocodone Swelling    Past Medical History: Past Medical History:  Diagnosis Date   Asthma    Eczema    HIV infection ([place])     Social History: Social History   Socioeconomic History   Marital status: Single    Spouse name: Not on file   Number of children: Not on file   Years of education: Not on file   Highest education level: Not on file  Occupational History   Occupation: Information systems manager  Tobacco Use   Smoking status: Never   Smokeless tobacco: Never  Vaping Use   Vaping Use: Never used  Substance and Sexual Activity   Alcohol use: Yes    Alcohol/week: 0.0 standard drinks of alcohol    Comment: rare   Drug use: Not Currently    Types: Marijuana    Comment: socially   Sexual activity: Not Currently    Partners: Male    Birth control/protection: Condom    Comment: declined condoms  Other Topics Concern   Not on file  Social History Narrative   Not on file   Social Determinants of Health   Financial Resource Strain: Not on file  Food Insecurity: Not on file   Transportation Needs: Not on file  Physical Activity: Not on file  Stress: Not on file  Social Connections: Not on file    Labs: Lab Results  Component Value Date   HIV1RNAQUANT NOT DETECTED 02/23/2022   HIV1RNAQUANT <20 (H) 08/16/2021   HIV1RNAQUANT Not Detected 05/21/2021   CD4TABS 948 02/23/2022   CD4TABS 970 08/16/2021   CD4TABS 1,075 01/04/2021    RPR and STI Lab Results  Component Value Date   LABRPR REACTIVE (A) 02/23/2022   LABRPR REACTIVE (A) 08/16/2021   LABRPR NON-REACTIVE 05/21/2021   LABRPR REACTIVE (A) 12/07/2020   LABRPR REACTIVE (A) 09/07/2020   RPRTITER 1:1 (H) 02/23/2022   RPRTITER 1:1 (H) 08/16/2021   RPRTITER 1:1 (H) 12/07/2020   RPRTITER 1:4 (H) 09/07/2020    STI Results GC CT  05/21/2021  8:59 AM Negative  Negative   12/07/2020  4:19 PM Negative  Negative   06/01/2018 12:00 AM Negative  Negative   02/12/2015 12:00 AM Negative  Negative     Hepatitis B Lab Results  Component Value Date   HEPBSAB BORDERLINE (A) 06/01/2018   HEPBSAG NON-REACTIVE 06/01/2018   HEPBCAB NON-REACTIVE 06/01/2018   Hepatitis C Lab Results  Component Value Date   HEPCAB NON-REACTIVE 06/01/2018   Hepatitis A Lab Results  Component Value Date  HAV NON-REACTIVE 06/01/2018   Lipids: Lab Results  Component Value Date   CHOL 193 04/18/2019   TRIG 234 (H) 04/18/2019   HDL 31 (L) 04/18/2019   CHOLHDL 6.2 (H) 04/18/2019   LDLCALC 124 (H) 04/18/2019    TARGET DATE:  The 31st of the month  Current HIV Regimen: Cabenuva q42month  Assessment: Jay Stabspresents today for their maintenance Cabenuva injections. Initial/past injections were tolerated well without issues. No problems with systemic effects of injections.   Administered cabotegravir 6056m67mL in left upper outer quadrant of the gluteal muscle. Administered rilpivirine 900 mg/67m57mn the right upper outer quadrant of the gluteal muscle. Monitored patient for 10 minutes after injection. Injections were  tolerated well without issue. Patient will follow up in 2 months for next injection. Will check HIV RNA today. Patient deferred STI testing today.   Plan: - Cabenuva injections administered - Check HIV RNA - Next injections scheduled for 11/2 with GreMarya Amslerd 11/30 with me  - Call with any issues or questions  AmaAlfonse SpruceharmD, CPP, BCIWormleysburginical Pharmacist Practitioner [facility] Infectious Disease

## 2022-07-09 LAB — HIV-1 RNA QUANT-NO REFLEX-BLD
HIV 1 RNA Quant: NOT DETECTED Copies/mL
HIV-1 RNA Quant, Log: NOT DETECTED Log cps/mL

## 2022-07-21 ENCOUNTER — Telehealth: Payer: Self-pay

## 2022-07-21 NOTE — Telephone Encounter (Signed)
[facility] Patient Advocate Encounter  Patient's medication (cabenuva 400 & 600mg 0 have been couriered to [facility] from [employer] and will be administered on the patient next office visit on 08/11/22.  Ileene Patrick , [place] [facility] Patient [facility] for Infectious Disease Phone: [phone] Fax:  [phone]

## 2022-08-11 ENCOUNTER — Ambulatory Visit (INDEPENDENT_AMBULATORY_CARE_PROVIDER_SITE_OTHER): Payer: Commercial Managed Care - PPO | Admitting: Family

## 2022-08-11 ENCOUNTER — Encounter: Payer: Self-pay | Admitting: Family

## 2022-08-11 ENCOUNTER — Other Ambulatory Visit: Payer: Self-pay

## 2022-08-11 VITALS — BP 134/87 | HR 66 | Temp 97.2°F | Ht 67.0 in | Wt 170.0 lb

## 2022-08-11 DIAGNOSIS — B20 Human immunodeficiency virus [HIV] disease: Secondary | ICD-10-CM

## 2022-08-11 DIAGNOSIS — Z0000 Encounter for general adult medical examination without abnormal findings: Secondary | ICD-10-CM

## 2022-08-11 MED ORDER — CABOTEGRAVIR & RILPIVIRINE ER 400 & 600 MG/2ML IM SUER
1.0000 | Freq: Once | INTRAMUSCULAR | Status: AC
  Administered 2022-08-11: 1 via INTRAMUSCULAR

## 2022-08-11 NOTE — Assessment & Plan Note (Signed)
Discussed importance of safe sexual practice and condom use. Condoms and STD testing offered.  Declines vaccinations.  

## 2022-08-11 NOTE — Progress Notes (Signed)
Brief Narrative   Patient ID: Jay Smith Smith, male    DOB: [birth year], 31 y.o.   MRN: [id number]  [name] Smith is a 31 y/o AA gentleman diagnosed with HIV in September 2019 with risk factor of MSM. Initial viral load was 24,700 and CD4 count of 690. Genotype with no significant medication resistance mutations. Entered care at [facility] Stage 1. No history of opportunistic infection. [id number] negative. Previous ART experience with Biktarvy, and Cabenuva. Switched from [place] with concern for increasing viral load. Now on Symtuza. Follow up [employer] remains with no significant medication resistant mutations.    Subjective:    Chief Complaint  Patient presents with   Follow-up    HPI:  Jay Smith Smith is a 31 y.o. male with HIV disease last seen on 05/19/2022 with well-controlled virus and good adherence and tolerance to Symtuza.  Changed to monthly Cabenuva. Last Cabenuva given on 07/07/22 without problems. Viral load at the time was undetectable. Here today for follow up and next injection.   Jay Smith Smith has been doing well since his last visit with no new concerns/complaints. Tolerating injections with no adverse side effects and minimal soreness. Condoms and STD testing offered. Declines vaccinations.  Denies fevers, chills, night sweats, headaches, changes in vision, neck pain/stiffness, nausea, diarrhea, vomiting, lesions or rashes.    Allergies  Allergen Reactions   Dovato [Dolutegravir-Lamivudine] Other (See Comments)    Hypersensitivity reaction with diarrhea, skin peeling and headache.    Hydrocodone Swelling      Outpatient Medications Prior to Visit  Medication Sig Dispense Refill   acetaminophen (MAPAP) 500 MG tablet      cabotegravir & rilpivirine ER (CABENUVA) 400 & 600 MG/2ML injection Inject 1 kit into the muscle every 30 (thirty) days. 4 mL 0   ibuprofen (ADVIL) 600 MG tablet      No facility-administered medications prior to visit.     Past  Medical History:  Diagnosis Date   Asthma    Eczema    HIV infection ([place])      Past Surgical History:  Procedure Laterality Date   COSMETIC SURGERY     Great Toe Surgery      Review of Systems  Constitutional:  Negative for appetite change, chills, fatigue, fever and unexpected weight change.  Eyes:  Negative for visual disturbance.  Respiratory:  Negative for cough, chest tightness, shortness of breath and wheezing.   Cardiovascular:  Negative for chest pain and leg swelling.  Gastrointestinal:  Negative for abdominal pain, constipation, diarrhea, nausea and vomiting.  Genitourinary:  Negative for dysuria, flank pain, frequency, genital sores, hematuria and urgency.  Skin:  Negative for rash.  Allergic/Immunologic: Negative for immunocompromised state.  Neurological:  Negative for dizziness and headaches.      Objective:    BP 134/87   Pulse 66   Temp (!) 97.2 F (36.2 C) (Oral)   Ht _0  (1.702 m)   Wt 170 lb (77.1 kg)   SpO2 98%   BMI 26.63 kg/m  Nursing note and vital signs reviewed.  Physical Exam Constitutional:      General: He is not in acute distress.    Appearance: He is well-developed.  Eyes:     Conjunctiva/sclera: Conjunctivae normal.  Cardiovascular:     Rate and Rhythm: Normal rate and regular rhythm.     Heart sounds: Normal heart sounds. No murmur heard.    No friction rub. No gallop.  Pulmonary:     Effort: Pulmonary effort  is normal. No respiratory distress.     Breath sounds: Normal breath sounds. No wheezing or rales.  Chest:     Chest wall: No tenderness.  Abdominal:     General: Bowel sounds are normal.     Palpations: Abdomen is soft.     Tenderness: There is no abdominal tenderness.  Musculoskeletal:     Cervical back: Neck supple.  Lymphadenopathy:     Cervical: No cervical adenopathy.  Skin:    General: Skin is warm and dry.     Findings: No rash.  Neurological:     Mental Status: He is alert and oriented to person,  place, and time.  Psychiatric:        Behavior: Behavior normal.        Thought Content: Thought content normal.        Judgment: Judgment normal.         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 Reviewed previous lab work and discussed plan of care. Check lab work today. Cabenuva injection provided without complication. Plan for follow up in 1 month or sooner if needed for next injection. Follow up with me in 3 months.       Relevant Orders   T-helper cell (CD4)- ([facility] clinic only)   HIV-1 RNA quant-no reflex-bld   Basic metabolic panel   Healthcare maintenance    Discussed importance of safe sexual practice and condom use. Condoms and STD testing offered.  Declines vaccinations.         I am having Jay Smith Smith "Jay Smith Smith" maintain his acetaminophen, ibuprofen, and Cabenuva. We administered cabotegravir & rilpivirine ER.   Meds ordered this encounter  Medications   cabotegravir & rilpivirine ER (CABENUVA) 400 & 600 MG/2ML injection 1 kit     Follow-up: Return in about 1 month (around 09/10/2022), or if symptoms worsen or fail to improve.   Terri Piedra, MSN, FNP-C Nurse Practitioner [facility] for Infectious Disease [place] number: [phone]

## 2022-08-11 NOTE — Assessment & Plan Note (Signed)
Jay Smith remains well controlled with current dose of monthly Cabenuva. Reviewed previous lab work and discussed plan of care. Check lab work today. Cabenuva injection provided without complication. Plan for follow up in 1 month or sooner if needed for next injection. Follow up with me in 3 months.

## 2022-08-11 NOTE — Patient Instructions (Signed)
Nice to see you.  We will check your lab work today.  Plan for follow up in 1 months or sooner if needed with lab work on the same day.  Have a great day and stay safe!

## 2022-08-12 LAB — T-HELPER CELL (CD4) - (RCID CLINIC ONLY)
CD4 % Helper T Cell: 43 % (ref 33–65)
CD4 T Cell Abs: 1111 /uL (ref 400–1790)

## 2022-08-13 LAB — BASIC METABOLIC PANEL
BUN: 13 mg/dL (ref 7–25)
CO2: 26 mmol/L (ref 20–32)
Calcium: 9.9 mg/dL (ref 8.6–10.3)
Chloride: 106 mmol/L (ref 98–110)
Creat: 1.06 mg/dL (ref 0.60–1.26)
Glucose, Bld: 89 mg/dL (ref 65–99)
Potassium: 3.9 mmol/L (ref 3.5–5.3)
Sodium: 140 mmol/L (ref 135–146)

## 2022-08-13 LAB — HIV-1 RNA QUANT-NO REFLEX-BLD
HIV 1 RNA Quant: NOT DETECTED Copies/mL
HIV-1 RNA Quant, Log: NOT DETECTED Log cps/mL

## 2022-08-16 ENCOUNTER — Ambulatory Visit: Payer: Commercial Managed Care - PPO | Admitting: Family

## 2022-08-17 ENCOUNTER — Ambulatory Visit: Payer: Commercial Managed Care - PPO | Admitting: Family

## 2022-08-18 ENCOUNTER — Telehealth: Payer: Self-pay

## 2022-08-18 NOTE — Telephone Encounter (Signed)
[facility] Patient Advocate Encounter  Patient's medication [name]) have been couriered to [facility] from [employer] and will be administered on the patient next office visit on 09/08/22.  Clearance Coots , CPhT Specialty Pharmacy Patient [facility] for Infectious Disease Phone: [phone] Fax:  [phone]

## 2022-09-07 NOTE — Progress Notes (Signed)
HPI: Jay Smith is a 31 y.o. male who presents to the [facility] clinic for [place] administration.  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   IBUPROFEN (ADVIL) 600 MG TABLET      Modified Medications   No medications on file  Discontinued Medications   No medications on file    Allergies: Allergies  Allergen Reactions   Dovato [Dolutegravir-Lamivudine] Other (See Comments)    Hypersensitivity reaction with diarrhea, skin peeling and headache.    Hydrocodone Swelling    Past Medical History: Past Medical History:  Diagnosis Date   Asthma    Eczema    HIV infection ([place])     Social History: Social History   Socioeconomic History   Marital status: Single    Spouse name: Not on file   Number of children: Not on file   Years of education: Not on file   Highest education level: Not on file  Occupational History   Occupation: Information systems manager  Tobacco Use   Smoking status: Never   Smokeless tobacco: Never  Vaping Use   Vaping Use: Never used  Substance and Sexual Activity   Alcohol use: Yes    Alcohol/week: 0.0 standard drinks of alcohol    Comment: rare   Drug use: Yes    Types: Marijuana    Comment: socially   Sexual activity: Not Currently    Partners: Male    Birth control/protection: Condom    Comment: given condoms  Other Topics Concern   Not on file  Social History Narrative   Not on file   Social Determinants of Health   Financial Resource Strain: Not on file  Food Insecurity: Not on file  Transportation  Needs: Not on file  Physical Activity: Not on file  Stress: Not on file  Social Connections: Not on file    Labs: Lab Results  Component Value Date   HIV1RNAQUANT Not Detected 08/11/2022   HIV1RNAQUANT Not Detected 07/07/2022   HIV1RNAQUANT NOT DETECTED 02/23/2022   CD4TABS 1,111 08/11/2022   CD4TABS 948 02/23/2022   CD4TABS 970 08/16/2021    RPR and STI Lab Results  Component Value Date   LABRPR REACTIVE (A) 02/23/2022   LABRPR REACTIVE (A) 08/16/2021   LABRPR NON-REACTIVE 05/21/2021   LABRPR REACTIVE (A) 12/07/2020   LABRPR REACTIVE (A) 09/07/2020   RPRTITER 1:1 (H) 02/23/2022   RPRTITER 1:1 (H) 08/16/2021   RPRTITER 1:1 (H) 12/07/2020   RPRTITER 1:4 (H) 09/07/2020    STI Results GC CT  05/21/2021  8:59 AM Negative  Negative   12/07/2020  4:19 PM Negative  Negative   06/01/2018 12:00 AM Negative  Negative   02/12/2015 12:00 AM Negative  Negative     Hepatitis B Lab Results  Component Value Date   HEPBSAB BORDERLINE (A) 06/01/2018   HEPBSAG NON-REACTIVE 06/01/2018   HEPBCAB NON-REACTIVE 06/01/2018   Hepatitis C Lab Results  Component Value Date   HEPCAB NON-REACTIVE 06/01/2018   Hepatitis A Lab Results  Component Value Date  HAV NON-REACTIVE 06/01/2018   Lipids: Lab Results  Component Value Date   CHOL 193 04/18/2019   TRIG 234 (H) 04/18/2019   HDL 31 (L) 04/18/2019   CHOLHDL 6.2 (H) 04/18/2019   LDLCALC 124 (H) 04/18/2019    TARGET DATE: The 30th of the month  Assessment: Jay Smith presents today for his maintenance Cabenuva injections. Past injections were tolerated well without issues.  He is eligible for the flu, COVID, Mpox, Hep A, Hep B, Prevnar20 (?), HPV and meningococcal vaccines. After discussing the risks and benefits, he ____.  Administered cabotegravir 464m/2mL in left upper outer quadrant of the gluteal muscle. Administered rilpivirine 600 mg/261min the right upper outer quadrant of the gluteal muscle. No issues with  injections. He will follow up in 2 months for next set of injections.  Plan: - Cabenuva injections administered - Next injections scheduled for *** with Jay Smith Call with any issues or questions  MaTanja PortStDot [place] [facility]or Infectious Disease

## 2022-09-08 ENCOUNTER — Ambulatory Visit (INDEPENDENT_AMBULATORY_CARE_PROVIDER_SITE_OTHER): Payer: Commercial Managed Care - PPO | Admitting: Pharmacist

## 2022-09-08 ENCOUNTER — Other Ambulatory Visit: Payer: Self-pay

## 2022-09-08 DIAGNOSIS — B20 Human immunodeficiency virus [HIV] disease: Secondary | ICD-10-CM

## 2022-09-08 MED ORDER — CABOTEGRAVIR & RILPIVIRINE ER 400 & 600 MG/2ML IM SUER
1.0000 | Freq: Once | INTRAMUSCULAR | Status: AC
  Administered 2022-09-08: 1 via INTRAMUSCULAR

## 2022-09-12 LAB — CBC WITH DIFFERENTIAL/PLATELET
Absolute Monocytes: 271 cells/uL (ref 200–950)
Basophils Absolute: 41 cells/uL (ref 0–200)
Basophils Relative: 0.7 %
Eosinophils Absolute: 59 cells/uL (ref 15–500)
Eosinophils Relative: 1 %
HCT: 42 % (ref 38.5–50.0)
Hemoglobin: 13.6 g/dL (ref 13.2–17.1)
Lymphs Abs: 2808 cells/uL (ref 850–3900)
MCH: 24.2 pg — ABNORMAL LOW (ref 27.0–33.0)
MCHC: 32.4 g/dL (ref 32.0–36.0)
MCV: 74.9 fL — ABNORMAL LOW (ref 80.0–100.0)
MPV: 10.9 fL (ref 7.5–12.5)
Monocytes Relative: 4.6 %
Neutro Abs: 2720 cells/uL (ref 1500–7800)
Neutrophils Relative %: 46.1 %
Platelets: 309 10*3/uL (ref 140–400)
RBC: 5.61 10*6/uL (ref 4.20–5.80)
RDW: 13.9 % (ref 11.0–15.0)
Total Lymphocyte: 47.6 %
WBC: 5.9 10*3/uL (ref 3.8–10.8)

## 2022-09-12 LAB — HIV-1 RNA QUANT-NO REFLEX-BLD
HIV 1 RNA Quant: NOT DETECTED Copies/mL
HIV-1 RNA Quant, Log: NOT DETECTED Log cps/mL

## 2022-09-12 LAB — HEPATITIS B SURFACE ANTIBODY, QUANTITATIVE: Hep B S AB Quant (Post): 13 m[IU]/mL (ref >=10)

## 2022-09-13 ENCOUNTER — Other Ambulatory Visit: Payer: Self-pay | Admitting: Pharmacist

## 2022-09-13 DIAGNOSIS — B20 Human immunodeficiency virus [HIV] disease: Secondary | ICD-10-CM

## 2022-09-13 MED ORDER — CABOTEGRAVIR & RILPIVIRINE ER 600 & 900 MG/3ML IM SUER: 1.0000 | INTRAMUSCULAR | 5 refills | Status: DC

## 2022-09-27 ENCOUNTER — Telehealth: Payer: Self-pay

## 2022-09-27 NOTE — Telephone Encounter (Signed)
[facility] Patient Advocate Encounter  Patient's medication (Cabenuva-monthly) have been couriered to [facility] from [employer] and will be administered on the patient next office visit on 10/13/21.  Clearance Coots , CPhT Specialty Pharmacy Patient [facility] for Infectious Disease Phone: [phone] Fax:  [phone]

## 2022-10-13 ENCOUNTER — Ambulatory Visit (INDEPENDENT_AMBULATORY_CARE_PROVIDER_SITE_OTHER): Payer: Commercial Managed Care - PPO | Admitting: Pharmacist

## 2022-10-13 ENCOUNTER — Other Ambulatory Visit: Payer: Self-pay

## 2022-10-13 ENCOUNTER — Other Ambulatory Visit (HOSPITAL_COMMUNITY)
Admission: RE | Admit: 2022-10-13 | Discharge: 2022-10-13 | Disposition: A | Discharge status: Home / Self Care | Payer: Commercial Managed Care - PPO | Source: Ambulatory Visit | Attending: Family | Admitting: Family

## 2022-10-13 ENCOUNTER — Encounter: Payer: Commercial Managed Care - PPO | Admitting: Pharmacist

## 2022-10-13 DIAGNOSIS — Z113 Encounter for screening for infections with a predominantly sexual mode of transmission: Secondary | ICD-10-CM | POA: Insufficient documentation

## 2022-10-13 DIAGNOSIS — B20 Human immunodeficiency virus [HIV] disease: Secondary | ICD-10-CM

## 2022-10-13 MED ORDER — CABOTEGRAVIR & RILPIVIRINE ER 400 & 600 MG/2ML IM SUER
1.0000 | Freq: Once | INTRAMUSCULAR | Status: AC
  Administered 2022-10-13: 1 via INTRAMUSCULAR

## 2022-10-13 NOTE — Progress Notes (Signed)
HPI: Jay Smith is a 32 y.o. male who presents to the [place] clinic for [place] administration.  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   CABOTEGRAVIR & RILPIVIRINE ER (CABENUVA) 600 & 900 MG/3ML INJECTION    Inject 1 kit into the muscle every 2 (two) months.   IBUPROFEN (ADVIL) 600 MG TABLET      Modified Medications   No medications on file  Discontinued Medications   No medications on file    Allergies: Allergies  Allergen Reactions   Dovato [Dolutegravir-Lamivudine] Other (See Comments)    Hypersensitivity reaction with diarrhea, skin peeling and headache.    Hydrocodone Swelling    Past Medical History: Past Medical History:  Diagnosis Date   Asthma    Eczema    HIV infection ([place])     Social History: Social History   Socioeconomic History   Marital status: Single    Spouse name: Not on file   Number of children: Not on file   Years of education: Not on file   Highest education level: Not on file  Occupational History   Occupation: Information systems manager  Tobacco Use   Smoking status: Never   Smokeless tobacco: Never  Vaping Use   Vaping Use: Never used  Substance and Sexual Activity   Alcohol use: Yes    Alcohol/week: 0.0 standard drinks of alcohol    Comment: rare   Drug use: Yes    Types: Marijuana    Comment: socially   Sexual activity: Not Currently    Partners: Male    Birth control/protection: Condom    Comment: given condoms  Other Topics Concern   Not on file  Social History Narrative   Not on file    Social Determinants of Health   Financial Resource Strain: Not on file  Food Insecurity: Not on file  Transportation Needs: Not on file  Physical Activity: Not on file  Stress: Not on file  Social Connections: Not on file    Labs: Lab Results  Component Value Date   HIV1RNAQUANT Not Detected 09/08/2022   HIV1RNAQUANT Not Detected 08/11/2022   HIV1RNAQUANT Not Detected 07/07/2022   CD4TABS 1,111 08/11/2022   CD4TABS 948 02/23/2022   CD4TABS 970 08/16/2021    RPR and STI Lab Results  Component Value Date   LABRPR REACTIVE (A) 02/23/2022   LABRPR REACTIVE (A) 08/16/2021   LABRPR NON-REACTIVE 05/21/2021   LABRPR REACTIVE (A) 12/07/2020   LABRPR REACTIVE (A) 09/07/2020   RPRTITER 1:1 (H) 02/23/2022   RPRTITER 1:1 (H) 08/16/2021   RPRTITER 1:1 (H) 12/07/2020   RPRTITER 1:4 (H) 09/07/2020    STI Results GC CT  05/21/2021  8:59 AM Negative  Negative   12/07/2020  4:19 PM Negative  Negative   06/01/2018 12:00 AM Negative  Negative   02/12/2015 12:00 AM Negative  Negative     Hepatitis B Lab Results  Component Value Date   HEPBSAB BORDERLINE (A) 06/01/2018   HEPBSAG NON-REACTIVE 06/01/2018   HEPBCAB NON-REACTIVE 06/01/2018  Hepatitis C Lab Results  Component Value Date   HEPCAB NON-REACTIVE 06/01/2018   Hepatitis A Lab Results  Component Value Date   HAV NON-REACTIVE 06/01/2018   Lipids: Lab Results  Component Value Date   CHOL 193 04/18/2019   TRIG 234 (H) 04/18/2019   HDL 31 (L) 04/18/2019   CHOLHDL 6.2 (H) 04/18/2019   LDLCALC 124 (H) 04/18/2019    TARGET DATE:  The 31st of the month  Current HIV Regimen: Cabenuva monthly  Assessment: [name] presents today for their maintenance Cabenuva injections. Initial/past injections were tolerated well without issues. No problems with systemic effects of injections. States he experienced soreness for 2-3 days after his last injection which he typically does not experience; found relief with heating pads.    Administered cabotegravir 400 mg/60m in left upper outer quadrant of the gluteal muscle. Administered rilpivirine 600 mg/287min the right upper outer quadrant of the gluteal muscle. Monitored patient for 10 minutes after injection. Injections were tolerated well without issue. Patient will follow up in 2 months for next injection. Will check HIV RNA today.   Will check urine cytologies and RPR today. Patient politely declines all vaccinations at this time.   Plan: - Cabenuva injections administered - Check HIV RNA and RPR - Next injections scheduled for 1/30 with me, 2/27 with GrMarya Amslerand 3/26 with me  - Call with any issues or questions  AmAlfonse SprucePharmD, CPP, BCIDP, AAWorlandlinical Pharmacist Practitioner Infectious Diseases [facility] Infectious Disease

## 2022-10-14 LAB — URINE CYTOLOGY ANCILLARY ONLY
Chlamydia: NEGATIVE
Comment: NEGATIVE
Comment: NORMAL
Neisseria Gonorrhea: NEGATIVE

## 2022-10-17 LAB — HIV-1 RNA QUANT-NO REFLEX-BLD
HIV 1 RNA Quant: NOT DETECTED Copies/mL
HIV-1 RNA Quant, Log: NOT DETECTED Log cps/mL

## 2022-10-17 LAB — RPR: RPR Ser Ql: NONREACTIVE

## 2022-11-02 ENCOUNTER — Telehealth: Payer: Self-pay

## 2022-11-02 NOTE — Telephone Encounter (Signed)
[facility] Patient Advocate Encounter  Patient's medications CABENUVA have been couriered to [facility] from [employer]: [phone], and will be administered on  11/08/2022.

## 2022-11-08 ENCOUNTER — Ambulatory Visit (INDEPENDENT_AMBULATORY_CARE_PROVIDER_SITE_OTHER): Payer: Commercial Managed Care - PPO | Admitting: Internal Medicine

## 2022-11-08 ENCOUNTER — Other Ambulatory Visit: Payer: Self-pay

## 2022-11-08 ENCOUNTER — Encounter: Payer: Self-pay | Admitting: Internal Medicine

## 2022-11-08 ENCOUNTER — Encounter: Payer: Commercial Managed Care - PPO | Admitting: Pharmacist

## 2022-11-08 VITALS — Ht 67.0 in | Wt 175.0 lb

## 2022-11-08 DIAGNOSIS — B20 Human immunodeficiency virus [HIV] disease: Secondary | ICD-10-CM | POA: Diagnosis not present

## 2022-11-08 MED ORDER — CABOTEGRAVIR & RILPIVIRINE ER 400 & 600 MG/2ML IM SUER
1.0000 | Freq: Once | INTRAMUSCULAR | Status: AC
  Administered 2022-11-08: 1 via INTRAMUSCULAR

## 2022-11-08 NOTE — Assessment & Plan Note (Addendum)
No issues and doing well on the monthly Cabenuva.  No changes and he has follow up scheduled.  Will defer labs for today.   Defers vaccines.   I have personally spent 30 minutes involved in face-to-face and non-face-to-face activities for this patient on the day of the visit. Professional time spent includes the following activities: Preparing to see the patient (review of tests), Obtaining and/or reviewing separately obtained history (admission/discharge record), Performing a medically appropriate examination and/or evaluation , Ordering medications/tests/procedures, referring and communicating with other health care professionals, Documenting clinical information in the EMR, Independently interpreting results (not separately reported), Communicating results to the patient/family/caregiver, Counseling and educating the patient/family/caregiver and Care coordination (not separately reported).

## 2022-11-08 NOTE — Progress Notes (Signed)
   Subjective:    Patient ID: Jay Smith, male    DOB: [birth year], 32 y.o.   MRN: [id number]  HPI Jay Smith is here for follow up of HIV He continues on [place] every 1 month and no issues with this. Has remained not detected, last checked 10/13/22.  No complaints today.     Review of Systems  Constitutional:  Negative for fatigue.  Gastrointestinal:  Negative for diarrhea and nausea.  Skin:  Negative for rash.       Objective:   Physical Exam Eyes:     General: No scleral icterus. Pulmonary:     Effort: Pulmonary effort is normal.  Neurological:     Mental Status: He is alert.    SH: no tobacco        Assessment & Plan:

## 2022-11-24 ENCOUNTER — Telehealth: Payer: Self-pay

## 2022-11-24 NOTE — Telephone Encounter (Signed)
[facility] Patient Advocate Encounter  Patient's medication (Cabenuva 400-66m) have been [facility]ered to [facility] [employer] will be administered on the patient next office visit on 12/06/22.  DIleene Patrick, CNorth New Hyde ParkSpecialty Pharmacy Patient [facility]anner [facility] Infectious Disease Phone: [phone]:  [phone]

## 2022-12-06 ENCOUNTER — Encounter: Payer: Self-pay | Admitting: Family

## 2022-12-06 ENCOUNTER — Other Ambulatory Visit: Payer: Self-pay

## 2022-12-06 ENCOUNTER — Ambulatory Visit (INDEPENDENT_AMBULATORY_CARE_PROVIDER_SITE_OTHER): Payer: Commercial Managed Care - PPO | Admitting: Family

## 2022-12-06 VITALS — BP 118/78 | HR 73 | Temp 98.6°F | Wt 169.0 lb

## 2022-12-06 DIAGNOSIS — B20 Human immunodeficiency virus [HIV] disease: Secondary | ICD-10-CM

## 2022-12-06 DIAGNOSIS — Z0000 Encounter for general adult medical examination without abnormal findings: Secondary | ICD-10-CM

## 2022-12-06 MED ORDER — CABOTEGRAVIR & RILPIVIRINE ER 400 & 600 MG/2ML IM SUER
1.0000 | Freq: Once | INTRAMUSCULAR | Status: AC
  Administered 2022-12-06: 1 via INTRAMUSCULAR

## 2022-12-06 NOTE — Assessment & Plan Note (Signed)
Jay Smith continues to have well controlled virus with good adherence and tolerance to [place]. Reviewed previous lab work and discussed plan of care. Monthly Cabenuva provided without complication. Plan for follow up in 1 month with pharmacy provider and with me in 4 months.

## 2022-12-06 NOTE — Patient Instructions (Signed)
Nice to see you.  Continue to take your medication daily as prescribed.  Plan for follow up in 4 months with monthly follow ups with Pharmacy providers.    Good luck in your new business.   Have a great day and stay safe!
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
## 2022-12-06 NOTE — Assessment & Plan Note (Signed)
Discussed importance of safe sexual practice and condom use. Condoms and STD testing offered.  Declines vaccines Routine dental care up to date. Introduced anal pap smear for anal cancer screening. Consider at next visit.

## 2022-12-30 ENCOUNTER — Telehealth: Payer: Self-pay | Admitting: Pharmacist

## 2022-12-30 NOTE — Telephone Encounter (Signed)
Patient's specialty medication Kern Reap) was delivered from [facility] and will be administered at next office visit on 01/03/23.  Alfonse Spruce, PharmD, CPP, BCIDP, [place] Clinical Pharmacist Practitioner [facility] [place] [facility]

## 2023-01-03 ENCOUNTER — Other Ambulatory Visit: Payer: Self-pay

## 2023-01-03 ENCOUNTER — Ambulatory Visit (INDEPENDENT_AMBULATORY_CARE_PROVIDER_SITE_OTHER): Payer: Commercial Managed Care - PPO | Admitting: Pharmacist

## 2023-01-03 DIAGNOSIS — B20 Human immunodeficiency virus [HIV] disease: Secondary | ICD-10-CM | POA: Diagnosis not present

## 2023-01-03 MED ORDER — CABOTEGRAVIR & RILPIVIRINE ER 400 & 600 MG/2ML IM SUER
1.0000 | Freq: Once | INTRAMUSCULAR | Status: AC
  Administered 2023-01-03: 1 via INTRAMUSCULAR

## 2023-01-03 NOTE — Progress Notes (Unsigned)
HPI: Jay Smith is a 32 y.o. male who presents to the [facility] for [place] administration.  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   IBUPROFEN (ADVIL) 600 MG TABLET      Modified Medications   No medications on file  Discontinued Medications   No medications on file    Allergies: Allergies  Allergen Reactions   Dovato [Dolutegravir-Lamivudine] Other (See Comments)    Hypersensitivity reaction with diarrhea, skin peeling and headache.    Hydrocodone Swelling    Past Medical History: Past Medical History:  Diagnosis Date   Asthma    Eczema    HIV infection ([place])     Social History: Social History   Socioeconomic History   Marital status: Single    Spouse name: Not on file   Number of children: Not on file   Years of education: Not on file   Highest education level: Not on file  Occupational History   Occupation: Information systems manager  Tobacco Use   Smoking status: Never   Smokeless tobacco: Never  Vaping Use   Vaping Use: Never used  Substance and Sexual Activity   Alcohol use: Yes    Alcohol/week: 0.0 standard drinks of alcohol    Comment: rare   Drug use: Yes    Types: Marijuana    Comment: socially   Sexual activity: Not Currently    Partners: Male    Birth control/protection: Condom    Comment: declined condoms  Other Topics Concern   Not on file  Social History Narrative   Not on file   Social Determinants of Health   Financial Resource Strain: Not on file  Food Insecurity: Not on file  Transportation  Needs: Not on file  Physical Activity: Not on file  Stress: Not on file  Social Connections: Not on file    Labs: Lab Results  Component Value Date   HIV1RNAQUANT Not Detected 10/13/2022   HIV1RNAQUANT Not Detected 09/08/2022   HIV1RNAQUANT Not Detected 08/11/2022   CD4TABS 1,111 08/11/2022   CD4TABS 948 02/23/2022   CD4TABS 970 08/16/2021    RPR and STI Lab Results  Component Value Date   LABRPR NON-REACTIVE 10/13/2022   LABRPR REACTIVE (A) 02/23/2022   LABRPR REACTIVE (A) 08/16/2021   LABRPR NON-REACTIVE 05/21/2021   LABRPR REACTIVE (A) 12/07/2020   RPRTITER 1:1 (H) 02/23/2022   RPRTITER 1:1 (H) 08/16/2021   RPRTITER 1:1 (H) 12/07/2020   RPRTITER 1:4 (H) 09/07/2020    STI Results GC CT  10/13/2022  3:35 PM Negative  Negative   05/21/2021  8:59 AM Negative  Negative   12/07/2020  4:19 PM Negative  Negative   06/01/2018 12:00 AM Negative  Negative   02/12/2015 12:00 AM Negative  Negative     Hepatitis B Lab Results  Component Value Date   HEPBSAB BORDERLINE (A) 06/01/2018   HEPBSAG NON-REACTIVE 06/01/2018   HEPBCAB NON-REACTIVE 06/01/2018   Hepatitis C Lab Results  Component Value Date   HEPCAB NON-REACTIVE 06/01/2018   Hepatitis A  Lab Results  Component Value Date   HAV NON-REACTIVE 06/01/2018   Lipids: Lab Results  Component Value Date   CHOL 193 04/18/2019   TRIG 234 (H) 04/18/2019   HDL 31 (L) 04/18/2019   CHOLHDL 6.2 (H) 04/18/2019   LDLCALC 124 (H) 04/18/2019    TARGET DATE: The 31st  Current HIV Regimen: Cabenuva monthly   Assessment: Jay Smith presents today for his maintenance Cabenuva injections. Past injections were tolerated well without issues. He reports a couple of days where he didn't have much of an appetite but says this has since resolved. He has remained undetectable since May, 2023 and his HIV RNA was last checked in January. I explained to the patient that I thought it was reasonable to get an HIV RNA today or defer to his next  appointment--he wished to have it checked today. He reports no recent signs/symptoms of an STI and politely declines STI testing today.   The patient is eligible for several vaccines, including HepA, PCV20, covid, HPV, meningococcal, and Mpox. He politely declines all vaccines today.   Administered cabotegravir 400mg /64mL in left upper outer quadrant of the gluteal muscle. ,Administered rilpivirine 600 mg/58mL in the right upper outer quadrant of the gluteal muscle. No issues with injections. He will follow up in 1 month for next set of injections.  Plan: - Cabenuva injections administered - Get HIV RNA  - Next injections scheduled for 02/07/23 and 03/07/23 with Estill Bamberg  - Call with any issues or questions   Billey Gosling, PharmD PGY1 Pharmacy Resident 3/26/20243:58 PM

## 2023-01-06 LAB — HIV-1 RNA QUANT-NO REFLEX-BLD
HIV 1 RNA Quant: NOT DETECTED Copies/mL
HIV-1 RNA Quant, Log: NOT DETECTED Log cps/mL

## 2023-01-24 ENCOUNTER — Emergency Department (HOSPITAL_BASED_OUTPATIENT_CLINIC_OR_DEPARTMENT_OTHER)
Admission: EM | Admit: 2023-01-24 | Discharge: 2023-01-24 | Disposition: A | Discharge status: Home / Self Care | Payer: Commercial Managed Care - PPO | Attending: Emergency Medicine | Admitting: Emergency Medicine

## 2023-01-24 ENCOUNTER — Encounter (HOSPITAL_BASED_OUTPATIENT_CLINIC_OR_DEPARTMENT_OTHER): Payer: Self-pay

## 2023-01-24 DIAGNOSIS — S134XXA Sprain of ligaments of cervical spine, initial encounter: Secondary | ICD-10-CM

## 2023-01-24 DIAGNOSIS — R519 Headache, unspecified: Secondary | ICD-10-CM | POA: Insufficient documentation

## 2023-01-24 DIAGNOSIS — J45909 Unspecified asthma, uncomplicated: Secondary | ICD-10-CM | POA: Diagnosis not present

## 2023-01-24 DIAGNOSIS — M545 Low back pain, unspecified: Secondary | ICD-10-CM | POA: Insufficient documentation

## 2023-01-24 DIAGNOSIS — Y92410 Unspecified street and highway as the place of occurrence of the external cause: Secondary | ICD-10-CM | POA: Insufficient documentation

## 2023-01-24 DIAGNOSIS — M542 Cervicalgia: Secondary | ICD-10-CM | POA: Diagnosis present

## 2023-01-24 MED ORDER — METHOCARBAMOL 500 MG PO TABS: 500.0000 mg | ORAL_TABLET | Freq: Two times a day (BID) | ORAL | 0 refills | Status: DC

## 2023-01-24 MED ORDER — KETOROLAC TROMETHAMINE 15 MG/ML IJ SOLN
15.0000 mg | Freq: Once | INTRAMUSCULAR | Status: AC
  Administered 2023-01-24: 15 mg via INTRAMUSCULAR
  Filled 2023-01-24: qty 1
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
## 2023-01-31 ENCOUNTER — Telehealth: Payer: Self-pay

## 2023-01-31 NOTE — Telephone Encounter (Signed)
[facility] Patient Advocate Encounter  Patient's medication (Cabenuva-monthly) have been couriered to [facility] from [facility] and will be administered on the patient next office visit on 02/07/23.  Clearance Coots , CPhT Specialty Pharmacy Patient [facility] for [facility] Phone: [phone] Fax:  [phone]

## 2023-02-01 ENCOUNTER — Encounter (HOSPITAL_BASED_OUTPATIENT_CLINIC_OR_DEPARTMENT_OTHER): Payer: Self-pay

## 2023-02-01 ENCOUNTER — Emergency Department (HOSPITAL_BASED_OUTPATIENT_CLINIC_OR_DEPARTMENT_OTHER)
Admission: EM | Admit: 2023-02-01 | Discharge: 2023-02-02 | Disposition: A | Discharge status: Home / Self Care | Payer: Commercial Managed Care - PPO | Attending: Emergency Medicine | Admitting: Emergency Medicine

## 2023-02-01 ENCOUNTER — Other Ambulatory Visit: Payer: Self-pay

## 2023-02-01 ENCOUNTER — Emergency Department (HOSPITAL_BASED_OUTPATIENT_CLINIC_OR_DEPARTMENT_OTHER): Payer: Commercial Managed Care - PPO | Admitting: Radiology

## 2023-02-01 DIAGNOSIS — S46911A Strain of unspecified muscle, fascia and tendon at shoulder and upper arm level, right arm, initial encounter: Secondary | ICD-10-CM | POA: Insufficient documentation

## 2023-02-01 DIAGNOSIS — J45909 Unspecified asthma, uncomplicated: Secondary | ICD-10-CM | POA: Diagnosis not present

## 2023-02-01 DIAGNOSIS — Y92410 Unspecified street and highway as the place of occurrence of the external cause: Secondary | ICD-10-CM | POA: Diagnosis not present

## 2023-02-01 DIAGNOSIS — Z21 Asymptomatic human immunodeficiency virus [HIV] infection status: Secondary | ICD-10-CM | POA: Insufficient documentation

## 2023-02-01 DIAGNOSIS — S46819D Strain of other muscles, fascia and tendons at shoulder and upper arm level, unspecified arm, subsequent encounter: Secondary | ICD-10-CM

## 2023-02-01 DIAGNOSIS — S39012A Strain of muscle, fascia and tendon of lower back, initial encounter: Secondary | ICD-10-CM | POA: Diagnosis not present

## 2023-02-01 DIAGNOSIS — S46912A Strain of unspecified muscle, fascia and tendon at shoulder and upper arm level, left arm, initial encounter: Secondary | ICD-10-CM | POA: Diagnosis not present

## 2023-02-01 DIAGNOSIS — S3992XA Unspecified injury of lower back, initial encounter: Secondary | ICD-10-CM | POA: Diagnosis present

## 2023-02-01 DIAGNOSIS — M545 Low back pain, unspecified: Secondary | ICD-10-CM

## 2023-02-01 MED ORDER — LIDOCAINE 5 % EX PTCH
2.0000 | MEDICATED_PATCH | CUTANEOUS | Status: DC
  Administered 2023-02-02: 2 via TRANSDERMAL
  Filled 2023-02-01: qty 2

## 2023-02-01 MED ORDER — IBUPROFEN 800 MG PO TABS
800.0000 mg | ORAL_TABLET | Freq: Once | ORAL | Status: AC
  Administered 2023-02-02: 800 mg via ORAL
  Filled 2023-02-01: qty 1
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
## 2023-02-02 MED ORDER — LIDOCAINE 5 % EX PTCH: 1.0000 | MEDICATED_PATCH | CUTANEOUS | 0 refills | Status: AC

## 2023-02-02 MED ORDER — NAPROXEN 500 MG PO TABS: 500.0000 mg | ORAL_TABLET | Freq: Two times a day (BID) | ORAL | 0 refills | Status: DC
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
## 2023-02-07 ENCOUNTER — Other Ambulatory Visit: Payer: Self-pay

## 2023-02-07 ENCOUNTER — Ambulatory Visit (INDEPENDENT_AMBULATORY_CARE_PROVIDER_SITE_OTHER): Payer: Commercial Managed Care - PPO | Admitting: Pharmacist

## 2023-02-07 DIAGNOSIS — B20 Human immunodeficiency virus [HIV] disease: Secondary | ICD-10-CM | POA: Diagnosis not present

## 2023-02-07 MED ORDER — CABOTEGRAVIR & RILPIVIRINE ER 400 & 600 MG/2ML IM SUER
1.0000 | Freq: Once | INTRAMUSCULAR | Status: AC
Start: 2023-02-07 — End: 2023-02-07
  Administered 2023-02-07: 1 via INTRAMUSCULAR

## 2023-02-07 NOTE — Progress Notes (Signed)
HPI: Jay Smith is a 32 y.o. male who presents to the [facility] pharmacy clinic for [place] administration.  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   IBUPROFEN (ADVIL) 600 MG TABLET       LIDOCAINE (LIDODERM) 5 %    Place 1 patch onto the skin daily. Remove & Discard patch within 12 hours or as directed by MD   METHOCARBAMOL (ROBAXIN) 500 MG TABLET    Take 1 tablet (500 mg total) by mouth 2 (two) times daily.   NAPROXEN (NAPROSYN) 500 MG TABLET    Take 1 tablet (500 mg total) by mouth 2 (two) times daily.  Modified Medications   No medications on file  Discontinued Medications   No medications on file    Allergies: Allergies  Allergen Reactions   Dovato [Dolutegravir-Lamivudine] Other (See Comments)    Hypersensitivity reaction with diarrhea, skin peeling and headache.    Hydrocodone Swelling    Past Medical History: Past Medical History:  Diagnosis Date   Asthma    Eczema    HIV infection (HCC)     Social History: Social History   Socioeconomic History   Marital status: Single    Spouse name: Not on file   Number of children: Not on file   Years of education: Not on file   Highest education level: Not on file  Occupational History   Occupation: Heritage manager  Tobacco Use   Smoking status: Never   Smokeless tobacco: Never  Vaping Use   Vaping Use: Never used  Substance and Sexual Activity   Alcohol use: Yes    Comment: rare   Drug use: Yes    Types: Marijuana    Comment: socially   Sexual activity: Not Currently     Partners: Male    Birth control/protection: Condom    Comment: declined condoms  Other Topics Concern   Not on file  Social History Narrative   Not on file   Social Determinants of Health   Financial Resource Strain: Not on file  Food Insecurity: Not on file  Transportation Needs: Not on file  Physical Activity: Not on file  Stress: Not on file  Social Connections: Not on file    Labs: Lab Results  Component Value Date   HIV1RNAQUANT Not Detected 01/03/2023   HIV1RNAQUANT Not Detected 10/13/2022   HIV1RNAQUANT Not Detected 09/08/2022   CD4TABS 1,111 08/11/2022   CD4TABS 948 02/23/2022   CD4TABS 970 08/16/2021    RPR and STI Lab Results  Component Value Date   LABRPR NON-REACTIVE 10/13/2022   LABRPR REACTIVE (A) 02/23/2022   LABRPR REACTIVE (A) 08/16/2021   LABRPR NON-REACTIVE 05/21/2021   LABRPR REACTIVE (A) 12/07/2020   RPRTITER 1:1 (H) 02/23/2022   RPRTITER 1:1 (H) 08/16/2021   RPRTITER 1:1 (H) 12/07/2020   RPRTITER 1:4 (H) 09/07/2020    STI Results GC CT  10/13/2022  3:35 PM Negative  Negative   05/21/2021  8:59 AM Negative  Negative   12/07/2020  4:19 PM Negative  Negative  06/01/2018 12:00 AM Negative  Negative   02/12/2015 12:00 AM Negative  Negative     Hepatitis B Lab Results  Component Value Date   HEPBSAB BORDERLINE (A) 06/01/2018   HEPBSAG NON-REACTIVE 06/01/2018   HEPBCAB NON-REACTIVE 06/01/2018   Hepatitis C Lab Results  Component Value Date   HEPCAB NON-REACTIVE 06/01/2018   Hepatitis A Lab Results  Component Value Date   HAV NON-REACTIVE 06/01/2018   Lipids: Lab Results  Component Value Date   CHOL 193 04/18/2019   TRIG 234 (H) 04/18/2019   HDL 31 (L) 04/18/2019   CHOLHDL 6.2 (H) 04/18/2019   LDLCALC 124 (H) 04/18/2019    TARGET DATE: 31st of each month   Assessment: Jay Smith presents today for his maintenance Cabenuva injections. Past injections were tolerated well without issues. Does not wish to have STI testing conducted  this visit. Patient offered condoms and politely declined.   Administered cabotegravir 600mg /78mL in left upper outer quadrant of the gluteal muscle. Administered rilpivirine 900 mg/32mL in the right upper outer quadrant of the gluteal muscle. No issues with injections. He will follow up in one months for next set of injections.  Plan: - Cabenuva injections administered - Next injections scheduled for 03/07/23 with Marchelle Folks  - Call with any issues or questions  Jani Gravel, PharmD PGY-2 Infectious Diseases Resident  02/07/2023 3:17 PM

## 2023-02-08 ENCOUNTER — Other Ambulatory Visit: Payer: Self-pay

## 2023-02-08 ENCOUNTER — Encounter (HOSPITAL_BASED_OUTPATIENT_CLINIC_OR_DEPARTMENT_OTHER): Payer: Self-pay

## 2023-02-08 ENCOUNTER — Emergency Department (HOSPITAL_BASED_OUTPATIENT_CLINIC_OR_DEPARTMENT_OTHER)
Admission: EM | Admit: 2023-02-08 | Discharge: 2023-02-08 | Disposition: A | Discharge status: Home / Self Care | Payer: Commercial Managed Care - PPO | Attending: Emergency Medicine | Admitting: Emergency Medicine

## 2023-02-08 DIAGNOSIS — K529 Noninfective gastroenteritis and colitis, unspecified: Secondary | ICD-10-CM | POA: Insufficient documentation

## 2023-02-08 DIAGNOSIS — R197 Diarrhea, unspecified: Secondary | ICD-10-CM | POA: Diagnosis present

## 2023-02-08 LAB — COMPREHENSIVE METABOLIC PANEL
ALT: 22 U/L (ref 0–44)
AST: 21 U/L (ref 15–41)
Albumin: 4.9 g/dL (ref 3.5–5.0)
Alkaline Phosphatase: 68 U/L (ref 38–126)
Anion gap: 12 (ref 5–15)
BUN: 18 mg/dL (ref 6–20)
CO2: 24 mmol/L (ref 22–32)
Calcium: 9.9 mg/dL (ref 8.9–10.3)
Chloride: 102 mmol/L (ref 98–111)
Creatinine, Ser: 1.18 mg/dL (ref 0.61–1.24)
GFR, Estimated: >60 mL/min (ref >60)
Glucose, Bld: 120 mg/dL — ABNORMAL HIGH (ref 70–99)
Potassium: 4.1 mmol/L (ref 3.5–5.1)
Sodium: 138 mmol/L (ref 135–145)
Total Bilirubin: 0.7 mg/dL (ref 0.3–1.2)
Total Protein: 8.8 g/dL — ABNORMAL HIGH (ref 6.5–8.1)

## 2023-02-08 LAB — CBC
HCT: 44.1 % (ref 39.0–52.0)
Hemoglobin: 14 g/dL (ref 13.0–17.0)
MCH: 23.9 pg — ABNORMAL LOW (ref 26.0–34.0)
MCHC: 31.7 g/dL (ref 30.0–36.0)
MCV: 75.1 fL — ABNORMAL LOW (ref 80.0–100.0)
Platelets: 278 10*3/uL (ref 150–400)
RBC: 5.87 MIL/uL — ABNORMAL HIGH (ref 4.22–5.81)
RDW: 16.2 % — ABNORMAL HIGH (ref 11.5–15.5)
WBC: 14.5 10*3/uL — ABNORMAL HIGH (ref 4.0–10.5)
nRBC: 0 % (ref 0.0–0.2)

## 2023-02-08 LAB — LIPASE, BLOOD: Lipase: 62 U/L — ABNORMAL HIGH (ref 11–51)

## 2023-02-08 MED ORDER — ONDANSETRON 4 MG PO TBDP: 4.0000 mg | ORAL_TABLET | ORAL | 0 refills | Status: DC | PRN

## 2023-02-08 MED ORDER — LACTATED RINGERS IV BOLUS
1000.0000 mL | Freq: Once | INTRAVENOUS | Status: AC
  Administered 2023-02-08: 1000 mL via INTRAVENOUS

## 2023-02-08 MED ORDER — ONDANSETRON HCL 4 MG/2ML IJ SOLN
INTRAMUSCULAR | Status: AC
  Filled 2023-02-08: qty 2

## 2023-02-08 MED ORDER — ONDANSETRON HCL 4 MG/2ML IJ SOLN
4.0000 mg | Freq: Once | INTRAMUSCULAR | Status: AC | PRN
  Administered 2023-02-08: 4 mg via INTRAVENOUS

## 2023-02-08 NOTE — ED Triage Notes (Signed)
Pt arrives via POV. Pt reports onset of nausea, vomiting, diarrhea, and abdominal discomfort that started around 0230.

## 2023-02-08 NOTE — ED Provider Notes (Signed)
[place] EMERGENCY DEPARTMENT AT [facility] Provider Note   CSN: [id number] Arrival date & time: 02/08/23  1027     History  Chief Complaint  Patient presents with   Emesis   Diarrhea    Jay Smith is a 32 y.o. male.  HPI Patient reports he awoke abruptly at 2:30 AM vomiting.  He has had multiple episodes of vomiting and diarrhea since that time.  Patient reports he went to bed feeling well.  He had eaten a Ramen soup dish from a restaurant in the evening.  His partner ate the same food and did not have similar symptoms.  Patient denies fever that he is aware of.  Mild abdominal discomfort but no significant abdominal pain.  No rashes.  No cough no shortness of breath.  Patient had routine administration of cabotegravir and rilpivirine injections yesterday.  He has had these administered multiple times and never had adverse reaction.    Home Medications Prior to Admission medications   Medication Sig Start Date End Date Taking? Authorizing Provider  ondansetron (ZOFRAN-ODT) 4 MG disintegrating tablet Take 1 tablet (4 mg total) by mouth every 4 (four) hours as needed for nausea or vomiting. 02/08/23  Yes Arby Barrette, MD  acetaminophen (MAPAP) 500 MG tablet     [provider]  cabotegravir & rilpivirine ER (CABENUVA) 400 & 600 MG/2ML injection Inject 1 kit into the muscle every 30 (thirty) days. 06/09/22   Jennette Kettle, RPH-CPP  ibuprofen (ADVIL) 600 MG tablet     [provider]  lidocaine (LIDODERM) 5 % Place 1 patch onto the skin daily. Remove & Discard patch within 12 hours or as directed by MD 02/02/23   Mardene Sayer, MD  methocarbamol (ROBAXIN) 500 MG tablet Take 1 tablet (500 mg total) by mouth 2 (two) times daily. 01/24/23   Lonell Grandchild, MD  naproxen (NAPROSYN) 500 MG tablet Take 1 tablet (500 mg total) by mouth 2 (two) times daily. 02/02/23   Mardene Sayer, MD      Allergies    Dovato [dolutegravir-lamivudine] and  Hydrocodone    Review of Systems   Review of Systems  Physical Exam Updated Vital Signs BP 114/84 (BP Location: Right Arm)   Pulse 88   Temp 98.2 F (36.8 C)   Resp 17   Ht 5\' 7"  (1.702 m)   Wt 72.6 kg   SpO2 100%   BMI 25.06 kg/m  Physical Exam Constitutional:      Appearance: Normal appearance.  HENT:     Mouth/Throat:     Pharynx: Oropharynx is clear.  Eyes:     Extraocular Movements: Extraocular movements intact.     Conjunctiva/sclera: Conjunctivae normal.  Cardiovascular:     Rate and Rhythm: Normal rate and regular rhythm.  Pulmonary:     Effort: Pulmonary effort is normal.     Breath sounds: Normal breath sounds.  Abdominal:     General: There is no distension.     Palpations: Abdomen is soft.     Tenderness: There is no abdominal tenderness. There is no guarding.  Musculoskeletal:        General: No swelling. Normal range of motion.     Right lower leg: No edema.     Left lower leg: No edema.  Skin:    General: Skin is warm and dry.  Neurological:     General: No focal deficit present.     Mental Status: He is alert and oriented to  person, place, and time.     Coordination: Coordination normal.  Psychiatric:        Mood and Affect: Mood normal.     ED Results / Procedures / Treatments   Labs (all labs ordered are listed, but only abnormal results are displayed) Labs Reviewed  LIPASE, BLOOD - Abnormal; Notable for the following components:      Result Value   Lipase 62 (*)    All other components within normal limits  COMPREHENSIVE METABOLIC PANEL - Abnormal; Notable for the following components:   Glucose, Bld 120 (*)    Total Protein 8.8 (*)    All other components within normal limits  CBC - Abnormal; Notable for the following components:   WBC 14.5 (*)    RBC 5.87 (*)    MCV 75.1 (*)    MCH 23.9 (*)    RDW 16.2 (*)    All other components within normal limits  URINALYSIS, ROUTINE W REFLEX MICROSCOPIC    EKG None  Radiology No  results found.  Procedures Procedures    Medications Ordered in ED Medications  ondansetron (ZOFRAN) injection 4 mg (4 mg Intravenous Given 02/08/23 0719)  lactated ringers bolus 1,000 mL (1,000 mLs Intravenous New Bag/Given 02/08/23 0731)    ED Course/ Medical Decision Making/ A&P                             Medical Decision Making Amount and/or Complexity of Data Reviewed Labs: ordered.  Risk Prescription drug management.   Patient had abrupt onset of vomiting and diarrheal illness last night.  He does have history of HIV but is well-controlled on medications with recent RNA nondetectable and normal CD4 counts.  He is compliant with medications and did have injection yesterday of scheduled cabotegravir and rilpivirine.  I have low suspicion that this is the etiology of his symptoms.  He has had multiple treatments and never had similar episode.  He did eat a Ramen noodle soup from a restaurant yesterday evening.  At this time based on rapid onset of symptoms I suspect foodborne illness.  Clinically patient is well-appearing at this time.  No respiratory distress, nontoxic, stable vital signs.  White blood cell count 14.5 normal H&H.  Normal GFR.  Lipase marginally elevated at 62.  Patient rechecked after a liter of fluids and Zofran.  He felt much improved.  Nausea is gone.  He reports he feels hungry.  At this time I feel patient is stable for discharge.  I have counseled on the use of Zofran and small amounts of fluids and bland foods.  I anticipate improvement of symptoms over the next 24 to 48 hours.  Return precautions have been reviewed and suggested follow-up and notification of his infectious disease provider.        Final Clinical Impression(s) / ED Diagnoses Final diagnoses:  Gastroenteritis    Rx / DC Orders ED Discharge Orders          Ordered    ondansetron (ZOFRAN-ODT) 4 MG disintegrating tablet  Every 4 hours PRN        02/08/23 0911               Arby Barrette, MD 02/08/23 [phone]

## 2023-02-08 NOTE — Discharge Instructions (Addendum)
1.  At this time I suspect you have a food related vomiting and diarrheal illness.  Symptoms appear to be improving at this time.  These types of illnesses are usually self-limited and are going away on their own within about 24 to 48 hours. 2.  It is important that you stay hydrated.  Take Zofran as needed to control nausea and drink small frequent amounts of fluids. 3.  Return to the emergency department if you are getting fevers, severe cramping abdominal pain, persistent vomiting and diarrhea or other concerning symptoms. 4.  Call your infectious disease doctor and let them know you have had a vomiting and diarrheal illness after your injection, however, at this time I do not suspect this is the cause of your symptoms.  They may want to see you in follow-up for recheck this week.

## 2023-02-08 NOTE — ED Notes (Signed)
Pt tolerated PO fluids

## 2023-02-23 ENCOUNTER — Telehealth: Payer: Self-pay

## 2023-02-23 NOTE — Telephone Encounter (Signed)
[facility] Patient Advocate Encounter  Patient's medication [name]) have been couriered to [facility] from [facility] and will be administered on the patient next office visit on 03/07/23.  Clearance Coots , CPhT Specialty Pharmacy Patient [facility] for Infectious Disease Phone: [phone] Fax:  [phone]

## 2023-03-06 NOTE — Progress Notes (Deleted)
HPI: Jay Smith is a 32 y.o. male who presents to the [facility] pharmacy clinic for [place] administration.  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   IBUPROFEN (ADVIL) 600 MG TABLET       LIDOCAINE (LIDODERM) 5 %    Place 1 patch onto the skin daily. Remove & Discard patch within 12 hours or as directed by MD   METHOCARBAMOL (ROBAXIN) 500 MG TABLET    Take 1 tablet (500 mg total) by mouth 2 (two) times daily.   NAPROXEN (NAPROSYN) 500 MG TABLET    Take 1 tablet (500 mg total) by mouth 2 (two) times daily.   ONDANSETRON (ZOFRAN-ODT) 4 MG DISINTEGRATING TABLET    Take 1 tablet (4 mg total) by mouth every 4 (four) hours as needed for nausea or vomiting.  Modified Medications   No medications on file  Discontinued Medications   No medications on file    Allergies: Allergies  Allergen Reactions   Dovato [Dolutegravir-Lamivudine] Other (See Comments)    Hypersensitivity reaction with diarrhea, skin peeling and headache.    Hydrocodone Swelling    Past Medical History: Past Medical History:  Diagnosis Date   Asthma    Eczema    HIV infection (HCC)     Social History: Social History   Socioeconomic History   Marital status: Single    Spouse name: Not on file   Number of children: Not on file   Years of education: Not on file   Highest education level: Not on file  Occupational History   Occupation: Heritage manager  Tobacco Use   Smoking status: Never   Smokeless tobacco: Never  Vaping Use   Vaping Use: Never used  Substance and Sexual  Activity   Alcohol use: Yes    Comment: rare   Drug use: Yes    Types: Marijuana    Comment: socially   Sexual activity: Not Currently    Partners: Male    Birth control/protection: Condom    Comment: declined condoms  Other Topics Concern   Not on file  Social History Narrative   Not on file   Social Determinants of Health   Financial Resource Strain: Not on file  Food Insecurity: Not on file  Transportation Needs: Not on file  Physical Activity: Not on file  Stress: Not on file  Social Connections: Not on file    Labs: Lab Results  Component Value Date   HIV1RNAQUANT Not Detected 01/03/2023   HIV1RNAQUANT Not Detected 10/13/2022   HIV1RNAQUANT Not Detected 09/08/2022   CD4TABS 1,111 08/11/2022   CD4TABS 948 02/23/2022   CD4TABS 970 08/16/2021    RPR and STI Lab Results  Component Value Date   LABRPR NON-REACTIVE 10/13/2022   LABRPR REACTIVE (A) 02/23/2022   LABRPR REACTIVE (A) 08/16/2021   LABRPR NON-REACTIVE 05/21/2021   LABRPR REACTIVE (A) 12/07/2020   RPRTITER 1:1 (H) 02/23/2022   RPRTITER 1:1 (H) 08/16/2021   RPRTITER 1:1 (H) 12/07/2020   RPRTITER 1:4 (H) 09/07/2020    STI  Results GC CT  10/13/2022  3:35 PM Negative  Negative   05/21/2021  8:59 AM Negative  Negative   12/07/2020  4:19 PM Negative  Negative   06/01/2018 12:00 AM Negative  Negative   02/12/2015 12:00 AM Negative  Negative     Hepatitis B Lab Results  Component Value Date   HEPBSAB BORDERLINE (A) 06/01/2018   HEPBSAG NON-REACTIVE 06/01/2018   HEPBCAB NON-REACTIVE 06/01/2018   Hepatitis C Lab Results  Component Value Date   HEPCAB NON-REACTIVE 06/01/2018   Hepatitis A Lab Results  Component Value Date   HAV NON-REACTIVE 06/01/2018   Lipids: Lab Results  Component Value Date   CHOL 193 04/18/2019   TRIG 234 (H) 04/18/2019   HDL 31 (L) 04/18/2019   CHOLHDL 6.2 (H) 04/18/2019   LDLCALC 124 (H) 04/18/2019    TARGET DATE: the 31st  Assessment: Jay Smith presents today for  his maintenance monthly Cabenuva injections. Past injections were tolerated well without issues. Last STI testing was 10/2022 and was negative.  *** new partners since last visit. No known exposures to any STIs since last visit. ***Agrees to full STI testing today with RPR and oral/urine/rectal cytologies.   Administered cabotegravir 600mg /36mL in left upper outer quadrant of the gluteal muscle. Administered rilpivirine 900 mg/59mL in the right upper outer quadrant of the gluteal muscle. No issues with injections. *** will follow up in one month for next set of injections.  Plan: - Cabenuva injections administered - Next injections scheduled for *** - Call with any issues or questions  Irish Elders, PharmD PGY-1 [facility] Pharmacy Resident

## 2023-03-07 ENCOUNTER — Encounter: Payer: Commercial Managed Care - PPO | Admitting: Pharmacist

## 2023-03-07 DIAGNOSIS — Z113 Encounter for screening for infections with a predominantly sexual mode of transmission: Secondary | ICD-10-CM

## 2023-03-07 NOTE — Progress Notes (Unsigned)
HPI: Jay Smith is a 32 y.o. male who presents to the [facility] pharmacy clinic for [place] administration.  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   IBUPROFEN (ADVIL) 600 MG TABLET       LIDOCAINE (LIDODERM) 5 %    Place 1 patch onto the skin daily. Remove & Discard patch within 12 hours or as directed by MD   METHOCARBAMOL (ROBAXIN) 500 MG TABLET    Take 1 tablet (500 mg total) by mouth 2 (two) times daily.   NAPROXEN (NAPROSYN) 500 MG TABLET    Take 1 tablet (500 mg total) by mouth 2 (two) times daily.   ONDANSETRON (ZOFRAN-ODT) 4 MG DISINTEGRATING TABLET    Take 1 tablet (4 mg total) by mouth every 4 (four) hours as needed for nausea or vomiting.  Modified Medications   No medications on file  Discontinued Medications   No medications on file    Allergies: Allergies  Allergen Reactions   Dovato [Dolutegravir-Lamivudine] Other (See Comments)    Hypersensitivity reaction with diarrhea, skin peeling and headache.    Hydrocodone Swelling    Past Medical History: Past Medical History:  Diagnosis Date   Asthma    Eczema    HIV infection (HCC)     Social History: Social History   Socioeconomic History   Marital status: Single    Spouse name: Not on file   Number of children: Not on file   Years of education: Not on file   Highest education level: Not on file  Occupational History   Occupation: Heritage manager  Tobacco Use   Smoking status: Never   Smokeless tobacco: Never  Vaping Use   Vaping Use: Never used  Substance and Sexual  Activity   Alcohol use: Yes    Comment: rare   Drug use: Yes    Types: Marijuana    Comment: socially   Sexual activity: Not Currently    Partners: Male    Birth control/protection: Condom    Comment: declined condoms  Other Topics Concern   Not on file  Social History Narrative   Not on file   Social Determinants of Health   Financial Resource Strain: Not on file  Food Insecurity: Not on file  Transportation Needs: Not on file  Physical Activity: Not on file  Stress: Not on file  Social Connections: Not on file    Labs: Lab Results  Component Value Date   HIV1RNAQUANT Not Detected 01/03/2023   HIV1RNAQUANT Not Detected 10/13/2022   HIV1RNAQUANT Not Detected 09/08/2022   CD4TABS 1,111 08/11/2022   CD4TABS 948 02/23/2022   CD4TABS 970 08/16/2021    RPR and STI Lab Results  Component Value Date   LABRPR NON-REACTIVE 10/13/2022   LABRPR REACTIVE (A) 02/23/2022   LABRPR REACTIVE (A) 08/16/2021   LABRPR NON-REACTIVE 05/21/2021   LABRPR REACTIVE (A) 12/07/2020   RPRTITER 1:1 (H) 02/23/2022   RPRTITER 1:1 (H) 08/16/2021   RPRTITER 1:1 (H) 12/07/2020   RPRTITER 1:4 (H) 09/07/2020    STI  Results GC CT  10/13/2022  3:35 PM Negative  Negative   05/21/2021  8:59 AM Negative  Negative   12/07/2020  4:19 PM Negative  Negative   06/01/2018 12:00 AM Negative  Negative   02/12/2015 12:00 AM Negative  Negative     Hepatitis B Lab Results  Component Value Date   HEPBSAB BORDERLINE (A) 06/01/2018   HEPBSAG NON-REACTIVE 06/01/2018   HEPBCAB NON-REACTIVE 06/01/2018   Hepatitis C Lab Results  Component Value Date   HEPCAB NON-REACTIVE 06/01/2018   Hepatitis A Lab Results  Component Value Date   HAV NON-REACTIVE 06/01/2018   Lipids: Lab Results  Component Value Date   CHOL 193 04/18/2019   TRIG 234 (H) 04/18/2019   HDL 31 (L) 04/18/2019   CHOLHDL 6.2 (H) 04/18/2019   LDLCALC 124 (H) 04/18/2019    TARGET DATE: 31st of each month   Assessment: Jay Smith  presents today for his maintenance Cabenuva injections. Past injections were tolerated well without issues. He does not wish to have STI testing conducted this visit since he has no new partners or exposures.   Administered cabotegravir 400mg /68mL in left upper outer quadrant of the gluteal muscle. Administered rilpivirine 600 mg/36mL in the right upper outer quadrant of the gluteal muscle. No issues with injections. He will follow up in one month for next set of injections. No HIV RNA assay was ordered since patient has been undetectable since 02/2022.  Jay Smith is eligible for several vaccines including PCV-20, hepatitis A, menveo, monkeypox, and his COVID booster. He states he would not like injections at the same time as his Cabenuva, but would be interested in setting up appointments in the future that are separate from [place] injections.  Plan: - Cabenuva injections administered - No labs today - Next injections scheduled for 04/12/2023 at 10:30AM with Marchelle Folks and 05/15/2023 at 11:15AM with Tammy Sours - F/u readiness for vaccines - Call with any issues or questions  Thanks,  Arabella Merles, PharmD. [facility] Acute Care PGY-1 03/08/2023 1:45 PM

## 2023-03-08 ENCOUNTER — Ambulatory Visit (INDEPENDENT_AMBULATORY_CARE_PROVIDER_SITE_OTHER): Payer: Commercial Managed Care - PPO | Admitting: Pharmacist

## 2023-03-08 ENCOUNTER — Other Ambulatory Visit: Payer: Self-pay

## 2023-03-08 DIAGNOSIS — B20 Human immunodeficiency virus [HIV] disease: Secondary | ICD-10-CM | POA: Diagnosis not present

## 2023-03-08 DIAGNOSIS — Z113 Encounter for screening for infections with a predominantly sexual mode of transmission: Secondary | ICD-10-CM

## 2023-03-08 MED ORDER — CABOTEGRAVIR & RILPIVIRINE ER 400 & 600 MG/2ML IM SUER
1.0000 | Freq: Once | INTRAMUSCULAR | Status: AC
Start: 2023-03-08 — End: 2023-03-08
  Administered 2023-03-08: 1 via INTRAMUSCULAR

## 2023-03-14 ENCOUNTER — Other Ambulatory Visit (HOSPITAL_COMMUNITY): Payer: Self-pay

## 2023-03-16 ENCOUNTER — Other Ambulatory Visit: Payer: Self-pay | Admitting: Pharmacist

## 2023-03-16 DIAGNOSIS — B20 Human immunodeficiency virus [HIV] disease: Secondary | ICD-10-CM

## 2023-03-16 MED ORDER — CABENUVA 400 & 600 MG/2ML IM SUER: 1.0000 | INTRAMUSCULAR | 11 refills | Status: DC

## 2023-03-16 MED ORDER — CABENUVA 400 & 600 MG/2ML IM SUER
1.0000 | INTRAMUSCULAR | 11 refills | Status: DC
Start: 2023-03-16 — End: 2023-03-16

## 2023-03-20 ENCOUNTER — Other Ambulatory Visit (HOSPITAL_COMMUNITY): Payer: Self-pay

## 2023-03-20 ENCOUNTER — Telehealth: Payer: Self-pay

## 2023-03-20 NOTE — Telephone Encounter (Signed)
[facility] Patient Advocate Encounter   Was successful in obtaining a Viiv copay card for Cabenuva.  This copay card will make the patients copay $0.00.  I have spoken with the patient.    The billing information is as follows and has been shared with [facility].          Jay Smith, CPhT Specialty Pharmacy Patient Advocate [facility] Phone: [phone] Fax:  [phone]  

## 2023-03-21 ENCOUNTER — Telehealth: Payer: Self-pay

## 2023-03-21 NOTE — Telephone Encounter (Signed)
[facility] Patient Advocate Encounter  Patient's medication (Cabenuva 400&600mg ) have been couriered to [facility] from [facility] and will be administered on the patient next office visit on 04/12/23.  Clearance Coots , CPhT Specialty Pharmacy Patient [facility] Division for Infectious Disease Phone: [phone] Fax:  [phone]

## 2023-03-30 ENCOUNTER — Other Ambulatory Visit: Payer: Self-pay | Admitting: Pharmacist

## 2023-03-30 DIAGNOSIS — B20 Human immunodeficiency virus [HIV] disease: Secondary | ICD-10-CM

## 2023-03-30 MED ORDER — CABENUVA 400 & 600 MG/2ML IM SUER
1.0000 | INTRAMUSCULAR | 11 refills | Status: DC
Start: 2023-03-30 — End: 2023-08-01

## 2023-04-06 ENCOUNTER — Other Ambulatory Visit (HOSPITAL_COMMUNITY): Payer: Self-pay

## 2023-04-12 ENCOUNTER — Other Ambulatory Visit: Payer: Self-pay

## 2023-04-12 ENCOUNTER — Ambulatory Visit (INDEPENDENT_AMBULATORY_CARE_PROVIDER_SITE_OTHER): Payer: Commercial Managed Care - PPO | Admitting: Pharmacist

## 2023-04-12 DIAGNOSIS — B20 Human immunodeficiency virus [HIV] disease: Secondary | ICD-10-CM

## 2023-04-12 MED ORDER — CABOTEGRAVIR & RILPIVIRINE ER 400 & 600 MG/2ML IM SUER
1.0000 | Freq: Once | INTRAMUSCULAR | Status: AC
Start: 2023-04-12 — End: 2023-04-12
  Administered 2023-04-12: 1 via INTRAMUSCULAR

## 2023-04-12 NOTE — Progress Notes (Signed)
HPI: Jay Smith is a 32 y.o. male who presents to the [facility] pharmacy clinic for [place] administration.  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   IBUPROFEN (ADVIL) 600 MG TABLET       LIDOCAINE (LIDODERM) 5 %    Place 1 patch onto the skin daily. Remove & Discard patch within 12 hours or as directed by MD   METHOCARBAMOL (ROBAXIN) 500 MG TABLET    Take 1 tablet (500 mg total) by mouth 2 (two) times daily.   NAPROXEN (NAPROSYN) 500 MG TABLET    Take 1 tablet (500 mg total) by mouth 2 (two) times daily.   ONDANSETRON (ZOFRAN-ODT) 4 MG DISINTEGRATING TABLET    Take 1 tablet (4 mg total) by mouth every 4 (four) hours as needed for nausea or vomiting.  Modified Medications   No medications on file  Discontinued Medications   No medications on file    Allergies: Allergies  Allergen Reactions   Dovato [Dolutegravir-Lamivudine] Other (See Comments)    Hypersensitivity reaction with diarrhea, skin peeling and headache.    Hydrocodone Swelling    Past Medical History: Past Medical History:  Diagnosis Date   Asthma    Eczema    HIV infection (HCC)     Social History: Social History   Socioeconomic History   Marital status: Single    Spouse name: Not on file   Number of children: Not on file   Years of education: Not on file   Highest education level: Not on file  Occupational History   Occupation: Heritage manager  Tobacco Use   Smoking status: Never   Smokeless tobacco: Never  Vaping Use   Vaping Use: Never used  Substance and Sexual  Activity   Alcohol use: Yes    Comment: rare   Drug use: Yes    Types: Marijuana    Comment: socially   Sexual activity: Not Currently    Partners: Male    Birth control/protection: Condom    Comment: declined condoms  Other Topics Concern   Not on file  Social History Narrative   Not on file   Social Determinants of Health   Financial Resource Strain: Not on file  Food Insecurity: Not on file  Transportation Needs: Not on file  Physical Activity: Not on file  Stress: Not on file  Social Connections: Not on file    Labs: Lab Results  Component Value Date   HIV1RNAQUANT Not Detected 01/03/2023   HIV1RNAQUANT Not Detected 10/13/2022   HIV1RNAQUANT Not Detected 09/08/2022   CD4TABS 1,111 08/11/2022   CD4TABS 948 02/23/2022   CD4TABS 970 08/16/2021    RPR and STI Lab Results  Component Value Date   LABRPR NON-REACTIVE 10/13/2022   LABRPR REACTIVE (A) 02/23/2022   LABRPR REACTIVE (A) 08/16/2021   LABRPR NON-REACTIVE 05/21/2021   LABRPR REACTIVE (A) 12/07/2020   RPRTITER 1:1 (H) 02/23/2022   RPRTITER 1:1 (H) 08/16/2021   RPRTITER 1:1 (H) 12/07/2020   RPRTITER 1:4 (H) 09/07/2020    STI  Results GC CT  10/13/2022  3:35 PM Negative  Negative   05/21/2021  8:59 AM Negative  Negative   12/07/2020  4:19 PM Negative  Negative   06/01/2018 12:00 AM Negative  Negative   02/12/2015 12:00 AM Negative  Negative     Hepatitis B Lab Results  Component Value Date   HEPBSAB BORDERLINE (A) 06/01/2018   HEPBSAG NON-REACTIVE 06/01/2018   HEPBCAB NON-REACTIVE 06/01/2018   Hepatitis C Lab Results  Component Value Date   HEPCAB NON-REACTIVE 06/01/2018   Hepatitis A Lab Results  Component Value Date   HAV NON-REACTIVE 06/01/2018   Lipids: Lab Results  Component Value Date   CHOL 193 04/18/2019   TRIG 234 (H) 04/18/2019   HDL 31 (L) 04/18/2019   CHOLHDL 6.2 (H) 04/18/2019   LDLCALC 124 (H) 04/18/2019    TARGET DATE:  The 31st of the month  Current HIV  Regimen: Cabenuva monthly   Assessment: Jay Smith presents today for their maintenance Cabenuva injections. Initial/past injections were tolerated well without issues. No problems with systemic effects of injections.   Administered cabotegravir 400 mg/25mL in left upper outer quadrant of the gluteal muscle. Administered rilpivirine 600 mg/52mL in the right upper outer quadrant of the gluteal muscle. Monitored patient for 10 minutes after injection. Injections were tolerated well without issue. Patient will follow up in 1 month for next injection. Will need routine HIV RNA and RPR screening at his next visit with Tammy Sours.  Continues to refuse all vaccines; due for COVID, PCV20, MPX, Menveo, HAV, and HPV.   Plan: - Cabenuva injections administered - Next injections scheduled for 8/5 with Tammy Sours and 8/27 with me  - Call with any issues or questions  Margarite Gouge, PharmD, CPP, BCIDP, AAHIVP Clinical Pharmacist Practitioner Infectious Diseases Clinical Pharmacist [facility]

## 2023-05-04 ENCOUNTER — Other Ambulatory Visit (HOSPITAL_COMMUNITY): Payer: Self-pay

## 2023-05-12 ENCOUNTER — Telehealth: Payer: Self-pay

## 2023-05-12 NOTE — Telephone Encounter (Signed)
Pharmacy Patient Advocate Encounter- Jay Smith BIV-Medical Benefit:  J code: [id number]  CPT code: 95284  Dx Code: B20  I went on [employer] portal to start a PA for cabenuva and was unsucessfull , I had to call the number on the back of the patient card. Then I was told his medical plan is covered under [facility] [phone] .   NO PA is required for [id number]. Ref # Sharlett Iles 9:36AM 05/12/23 CENTRAL TIME.  I was not able to get a fax to put in Media.   Patient is already enrolled in [employer] Portal to help pay with his admin and medicine coverage.

## 2023-05-15 ENCOUNTER — Other Ambulatory Visit (HOSPITAL_COMMUNITY)
Admission: RE | Admit: 2023-05-15 | Discharge: 2023-05-15 | Disposition: A | Discharge status: Home / Self Care | Payer: Commercial Managed Care - PPO | Source: Ambulatory Visit | Attending: Family | Admitting: Family

## 2023-05-15 ENCOUNTER — Other Ambulatory Visit: Payer: Self-pay

## 2023-05-15 ENCOUNTER — Ambulatory Visit (INDEPENDENT_AMBULATORY_CARE_PROVIDER_SITE_OTHER): Payer: Commercial Managed Care - PPO | Admitting: Family

## 2023-05-15 ENCOUNTER — Encounter: Payer: Self-pay | Admitting: Family

## 2023-05-15 VITALS — BP 116/77 | HR 57 | Temp 97.1°F | Ht 67.0 in | Wt 169.0 lb

## 2023-05-15 DIAGNOSIS — B20 Human immunodeficiency virus [HIV] disease: Secondary | ICD-10-CM

## 2023-05-15 DIAGNOSIS — Z0000 Encounter for general adult medical examination without abnormal findings: Secondary | ICD-10-CM

## 2023-05-15 DIAGNOSIS — Z113 Encounter for screening for infections with a predominantly sexual mode of transmission: Secondary | ICD-10-CM

## 2023-05-15 MED ORDER — CABOTEGRAVIR & RILPIVIRINE ER 400 & 600 MG/2ML IM SUER
1.0000 | Freq: Once | INTRAMUSCULAR | Status: AC
Start: 2023-05-15 — End: 2023-05-15
  Administered 2023-05-15: 1 via INTRAMUSCULAR

## 2023-05-15 NOTE — Patient Instructions (Addendum)
Nice to see you.  We will check your lab work today.  cPlan for follow up in 1 months or sooner if needed with lab work on the same day.  Have a great day and stay safe!

## 2023-05-15 NOTE — Assessment & Plan Note (Signed)
Jay Smith continues to have well controlled with good adherence and tolerance to [place]. Reviewed previous lab work and discussed plan of care and U equals U. Also discussed that it is likely safe for him to return to [place] if needed in the future. Check lab work. Next dose of monthly Cabenuva provided without complication. Plan for follow up in 1 month for next dose of Cabenuva as planned.

## 2023-05-15 NOTE — Assessment & Plan Note (Signed)
Discussed importance of safe sexual practice and condom use. Condoms and STD testing offered.  Declines vaccinations Introduced rectal cancer screening with anal pap and will consider at next office visit.

## 2023-05-15 NOTE — Progress Notes (Signed)
Brief Narrative   Patient ID: Jay Smith, male    DOB: [birth year], 32 y.o.   MRN: [id number]  Jay Smith is a 32 y/o AA gentleman diagnosed with HIV in September 2019 with risk factor of MSM. Initial viral load was 24,700 and CD4 count of 690. Genotype with no significant medication resistance mutations. Entered care at [facility] Stage 1. No history of opportunistic infection. [id number] negative. Previous ART experience with Biktarvy, and Cabenuva. Switched from [place] with concern for increasing viral load.  Follow up [employer] remains with no significant medication resistant mutations and now back to [place] after [place].   Subjective:    Chief Complaint  Patient presents with   Follow-up    B20[name]     HPI:  Jay Smith is a 32 y.o. male with HIV disease last seen by Margarite Gouge, PharmD, CPP on 04/12/23 with good adherence and tolerance to [facility] and well controlled virus with last viral load undetectable. Here today for follow up.   Jay Smith has been doing well since his last office and continues to tolerate Cabenuva with no adverse side effects. Remains out of work secondary to a back injury and continues to have insurance coverage. No new concerns/complaints. Condoms and STD testing offered.   Denies fevers, chills, night sweats, headaches, changes in vision, neck pain/stiffness, nausea, diarrhea, vomiting, lesions or rashes.   Allergies  Allergen Reactions   Dovato [Dolutegravir-Lamivudine] Other (See Comments)    Hypersensitivity reaction with diarrhea, skin peeling and headache.    Hydrocodone Swelling      Outpatient Medications Prior to Visit  Medication Sig Dispense Refill   acetaminophen (MAPAP) 500 MG tablet      cabotegravir & rilpivirine ER (CABENUVA) 400 & 600 MG/2ML injection Inject 1 kit into the muscle every 30 (thirty) days. 4 mL 11   lidocaine (LIDODERM) 5 % Place 1 patch onto the skin daily. Remove & Discard patch within  12 hours or as directed by MD 30 patch 0   ondansetron (ZOFRAN-ODT) 4 MG disintegrating tablet Take 1 tablet (4 mg total) by mouth every 4 (four) hours as needed for nausea or vomiting. 20 tablet 0   ibuprofen (ADVIL) 600 MG tablet  (Patient not taking: Reported on 05/15/2023)     methocarbamol (ROBAXIN) 500 MG tablet Take 1 tablet (500 mg total) by mouth 2 (two) times daily. (Patient not taking: Reported on 05/15/2023) 20 tablet 0   naproxen (NAPROSYN) 500 MG tablet Take 1 tablet (500 mg total) by mouth 2 (two) times daily. (Patient not taking: Reported on 05/15/2023) 30 tablet 0   No facility-administered medications prior to visit.     Past Medical History:  Diagnosis Date   Asthma    Eczema    HIV infection (HCC)      Past Surgical History:  Procedure Laterality Date   COSMETIC SURGERY     Great Toe Surgery      Review of Systems  Constitutional:  Negative for appetite change, chills, fatigue, fever and unexpected weight change.  Eyes:  Negative for visual disturbance.  Respiratory:  Negative for cough, chest tightness, shortness of breath and wheezing.   Cardiovascular:  Negative for chest pain and leg swelling.  Gastrointestinal:  Negative for abdominal pain, constipation, diarrhea, nausea and vomiting.  Genitourinary:  Negative for dysuria, flank pain, frequency, genital sores, hematuria and urgency.  Skin:  Negative for rash.  Allergic/Immunologic: Negative for immunocompromised state.  Neurological:  Negative for dizziness and  headaches.      Objective:    BP 116/77   Pulse (!) 57   Temp (!) 97.1 F (36.2 C) (Temporal)   Ht 5\' 7"  (1.702 m)   Wt 169 lb (76.7 kg)   BMI 26.47 kg/m  Nursing note and vital signs reviewed.  Physical Exam Constitutional:      General: He is not in acute distress.    Appearance: He is well-developed.  Eyes:     Conjunctiva/sclera: Conjunctivae normal.  Cardiovascular:     Rate and Rhythm: Normal rate and regular rhythm.     Heart  sounds: Normal heart sounds. No murmur heard.    No friction rub. No gallop.  Pulmonary:     Effort: Pulmonary effort is normal. No respiratory distress.     Breath sounds: Normal breath sounds. No wheezing or rales.  Chest:     Chest wall: No tenderness.  Abdominal:     General: Bowel sounds are normal.     Palpations: Abdomen is soft.     Tenderness: There is no abdominal tenderness.  Musculoskeletal:     Cervical back: Neck supple.  Lymphadenopathy:     Cervical: No cervical adenopathy.  Skin:    General: Skin is warm and dry.     Findings: No rash.  Neurological:     Mental Status: He is alert and oriented to person, place, and time.  Psychiatric:        Behavior: Behavior normal.        Thought Content: Thought content normal.        Judgment: Judgment normal.         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 Reviewed previous lab work and discussed plan of care and U equals U. Also discussed that it is likely safe for him to return to [place] if needed in the future. Check lab work. Next dose of monthly Cabenuva provided without complication. Plan for follow up in 1 month for next dose of Cabenuva as planned.       Relevant Orders   BASIC METABOLIC PANEL WITH GFR   HIV-1 RNA quant-no reflex-bld    T-helper cell (CD4)- ([facility] clinic only)   Healthcare maintenance    Discussed importance of safe sexual practice and condom use. Condoms and STD testing offered.  Declines vaccinations Introduced rectal cancer screening with anal pap and will consider at next office visit.       Other Visit Diagnoses     Screening for STDs (sexually transmitted diseases)       Relevant Orders   RPR   Urine cytology ancillary only  I have discontinued Royetta Crochet. Fedak "Chris"'s ibuprofen, methocarbamol, and naproxen. I am also having him maintain his acetaminophen, lidocaine, ondansetron, and Cabenuva. We administered cabotegravir & rilpivirine ER.   Meds ordered this encounter  Medications   cabotegravir & rilpivirine ER (CABENUVA) 400 & 600 MG/2ML injection 1 kit     Follow-up: Return in about 1 month (around 06/15/2023), or if symptoms worsen or fail to improve.   Marcos Eke, MSN, FNP-C Nurse Practitioner [facility] for Infectious Disease [facility] Main number: [phone]

## 2023-06-05 NOTE — Progress Notes (Incomplete)
HPI: Jay Smith is a 32 y.o. male who presents to the [facility] pharmacy clinic for [place] administration.  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   LIDOCAINE (LIDODERM) 5 %    Place 1 patch onto the skin daily. Remove & Discard patch within 12 hours or as directed by MD   ONDANSETRON (ZOFRAN-ODT) 4 MG DISINTEGRATING TABLET    Take 1 tablet (4 mg total) by mouth every 4 (four) hours as needed for nausea or vomiting.  Modified Medications   No medications on file  Discontinued Medications   No medications on file    Allergies: Allergies  Allergen Reactions   Dovato [Dolutegravir-Lamivudine] Other (See Comments)    Hypersensitivity reaction with diarrhea, skin peeling and headache.    Hydrocodone Swelling    Labs: Lab Results  Component Value Date   HIV1RNAQUANT Not Detected 05/15/2023   HIV1RNAQUANT Not Detected 01/03/2023   HIV1RNAQUANT Not Detected 10/13/2022   CD4TABS 707 05/15/2023   CD4TABS 1,111 08/11/2022   CD4TABS 948 02/23/2022    RPR and STI Lab Results  Component Value Date   LABRPR REACTIVE (A) 05/15/2023   LABRPR NON-REACTIVE 10/13/2022   LABRPR REACTIVE (A) 02/23/2022   LABRPR REACTIVE (A) 08/16/2021   LABRPR NON-REACTIVE 05/21/2021   RPRTITER 1:1 (H) 05/15/2023   RPRTITER 1:1 (H) 02/23/2022   RPRTITER 1:1 (H) 08/16/2021   RPRTITER 1:1 (H) 12/07/2020   RPRTITER 1:4 (H) 09/07/2020    STI Results GC CT  05/15/2023 11:33 AM Negative  Negative   10/13/2022  3:35 PM Negative  Negative    05/21/2021  8:59 AM Negative  Negative   12/07/2020  4:19 PM Negative  Negative   06/01/2018 12:00 AM Negative  Negative   02/12/2015 12:00 AM Negative  Negative     Hepatitis B Lab Results  Component Value Date   HEPBSAB BORDERLINE (A) 06/01/2018   HEPBSAG NON-REACTIVE 06/01/2018   HEPBCAB NON-REACTIVE 06/01/2018   Hepatitis C Lab Results  Component Value Date   HEPCAB NON-REACTIVE 06/01/2018   Hepatitis A Lab Results  Component Value Date   HAV NON-REACTIVE 06/01/2018   Lipids: Lab Results  Component Value Date   CHOL 193 04/18/2019   TRIG 234 (H) 04/18/2019   HDL 31 (L) 04/18/2019   CHOLHDL 6.2 (H) 04/18/2019   LDLCALC 124 (H) 04/18/2019    TARGET DATE: The 31st of the month  Assessment: Jay Smith presents today for his maintenance Cabenuva injections. Past injections were tolerated well without issues.  Administered cabotegravir 600mg /79mL in left upper outer quadrant of the gluteal muscle. Administered rilpivirine 900 mg/69mL in the right upper outer quadrant of the gluteal muscle. No issues with injections. He will follow up in 2 months for next set of injections.  STI testing in all sites since it's been a long time (or ever)  Flu, pcv20, menveo, hav, hpv  Plan: - Cabenuva injections administered - Next injections scheduled for *** - Call with any issues or questions  Lennie Muckle, PharmD PGY1 Pharmacy  Resident 06/05/2023 10:57 AM

## 2023-06-06 ENCOUNTER — Ambulatory Visit: Payer: Commercial Managed Care - PPO | Admitting: Pharmacist

## 2023-06-06 ENCOUNTER — Ambulatory Visit (INDEPENDENT_AMBULATORY_CARE_PROVIDER_SITE_OTHER): Payer: Commercial Managed Care - PPO | Admitting: Pharmacist

## 2023-06-06 ENCOUNTER — Other Ambulatory Visit (HOSPITAL_COMMUNITY)
Admission: RE | Admit: 2023-06-06 | Discharge: 2023-06-06 | Disposition: A | Discharge status: Home / Self Care | Payer: Commercial Managed Care - PPO | Source: Ambulatory Visit | Attending: Family | Admitting: Family

## 2023-06-06 ENCOUNTER — Other Ambulatory Visit: Payer: Self-pay

## 2023-06-06 DIAGNOSIS — Z113 Encounter for screening for infections with a predominantly sexual mode of transmission: Secondary | ICD-10-CM

## 2023-06-06 DIAGNOSIS — B20 Human immunodeficiency virus [HIV] disease: Secondary | ICD-10-CM | POA: Diagnosis not present

## 2023-06-06 MED ORDER — CABOTEGRAVIR & RILPIVIRINE ER 400 & 600 MG/2ML IM SUER
1.0000 | Freq: Once | INTRAMUSCULAR | Status: AC
Start: 2023-06-06 — End: 2023-06-06
  Administered 2023-06-06: 1 via INTRAMUSCULAR

## 2023-06-07 LAB — CYTOLOGY, (ORAL, ANAL, URETHRAL) ANCILLARY ONLY
Chlamydia: NEGATIVE
Chlamydia: NEGATIVE
Comment: NEGATIVE
Comment: NEGATIVE
Comment: NORMAL
Comment: NORMAL
Neisseria Gonorrhea: NEGATIVE
Neisseria Gonorrhea: NEGATIVE

## 2023-06-07 LAB — URINE CYTOLOGY ANCILLARY ONLY
Chlamydia: NEGATIVE
Comment: NEGATIVE
Comment: NORMAL
Neisseria Gonorrhea: NEGATIVE

## 2023-06-19 ENCOUNTER — Encounter: Payer: Self-pay | Admitting: Pharmacist

## 2023-06-20 ENCOUNTER — Telehealth: Payer: Self-pay | Admitting: Pharmacist

## 2023-06-20 NOTE — Telephone Encounter (Signed)
Jay Smith messaged me and asked if I could give him a call regarding cortisone injections he is to receive next week. Called and spoke to him. Advised him that there are no drug interactions with Cabenuva and cortisone and he was fine to receive them. No further questions.  Gerod Caligiuri L. Jannette Fogo, PharmD, BCIDP, AAHIVP, CPP Clinical Pharmacist Practitioner Infectious Diseases Clinical Pharmacist [facility] 06/20/2023, 10:20 AM

## 2023-07-05 ENCOUNTER — Ambulatory Visit (INDEPENDENT_AMBULATORY_CARE_PROVIDER_SITE_OTHER): Payer: Commercial Managed Care - PPO | Admitting: Pharmacist

## 2023-07-05 ENCOUNTER — Other Ambulatory Visit: Payer: Self-pay

## 2023-07-05 DIAGNOSIS — B20 Human immunodeficiency virus [HIV] disease: Secondary | ICD-10-CM | POA: Diagnosis not present

## 2023-07-05 MED ORDER — CABOTEGRAVIR & RILPIVIRINE ER 400 & 600 MG/2ML IM SUER
1.0000 | Freq: Once | INTRAMUSCULAR | Status: AC
Start: 2023-07-05 — End: 2023-07-05
  Administered 2023-07-05: 1 via INTRAMUSCULAR

## 2023-07-05 NOTE — Progress Notes (Addendum)
HPI: Jay Smith is a 32 y.o. male who presents to the [facility] pharmacy clinic for Jay Smith administration.  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   LIDOCAINE (LIDODERM) 5 %    Place 1 patch onto the skin daily. Remove & Discard patch within 12 hours or as directed by MD   ONDANSETRON (ZOFRAN-ODT) 4 MG DISINTEGRATING TABLET    Take 1 tablet (4 mg total) by mouth every 4 (four) hours as needed for nausea or vomiting.  Modified Medications   No medications on file  Discontinued Medications   No medications on file    Allergies: Allergies  Allergen Reactions   Dovato [Dolutegravir-Lamivudine] Other (See Comments)    Hypersensitivity reaction with diarrhea, skin peeling and headache.    Hydrocodone Swelling    Past Medical History: Past Medical History:  Diagnosis Date   Asthma    Eczema    HIV infection (HCC)     Social History: Social History   Socioeconomic History   Marital status: Single    Spouse name: Not on file   Number of children: Not on file   Years of education: Not on file   Highest education level: Not on file  Occupational History   Occupation: [employer] manager  Tobacco Use   Smoking status: Never   Smokeless tobacco: Never  Vaping Use   Vaping status: Never Used  Substance and Sexual Activity   Alcohol use: Yes    Comment: rare   Drug use: Yes    Types: Marijuana    Comment: socially   Sexual activity: Not Currently    Partners: Male    Birth control/protection: Condom    Comment:  declined condoms  Other Topics Concern   Not on file  Social History Narrative   Not on file   Social Determinants of Health   Financial Resource Strain: Not on file  Food Insecurity: Not on file  Transportation Needs: Not on file  Physical Activity: Not on file  Stress: Not on file  Social Connections: Unknown (04/28/2023)   Received from [facility]   Social Network    Social Network: Not on file    Labs: Lab Results  Component Value Date   HIV1RNAQUANT Not Detected 05/15/2023   HIV1RNAQUANT Not Detected 01/03/2023   HIV1RNAQUANT Not Detected 10/13/2022   CD4TABS 707 05/15/2023   CD4TABS 1,111 08/11/2022   CD4TABS 948 02/23/2022    RPR and STI Lab Results  Component Value Date   LABRPR REACTIVE (A) 05/15/2023   LABRPR NON-REACTIVE 10/13/2022   LABRPR REACTIVE (A) 02/23/2022   LABRPR REACTIVE (A) 08/16/2021   LABRPR NON-REACTIVE 05/21/2021   RPRTITER 1:1 (H) 05/15/2023   RPRTITER 1:1 (H) 02/23/2022   RPRTITER 1:1 (H) 08/16/2021   RPRTITER 1:1 (H) 12/07/2020   RPRTITER 1:4 (H) 09/07/2020    STI Results GC CT  06/06/2023 10:19 AM Negative    Negative    Negative  Negative    Negative    Negative   05/15/2023  11:33 AM Negative  Negative   10/13/2022  3:35 PM Negative  Negative   05/21/2021  8:59 AM Negative  Negative   12/07/2020  4:19 PM Negative  Negative   06/01/2018 12:00 AM Negative  Negative   02/12/2015 12:00 AM Negative  Negative     Hepatitis B Lab Results  Component Value Date   HEPBSAB BORDERLINE (A) 06/01/2018   HEPBSAG NON-REACTIVE 06/01/2018   HEPBCAB NON-REACTIVE 06/01/2018   Hepatitis C Lab Results  Component Value Date   HEPCAB NON-REACTIVE 06/01/2018   Hepatitis A Lab Results  Component Value Date   HAV NON-REACTIVE 06/01/2018   Lipids: Lab Results  Component Value Date   CHOL 193 04/18/2019   TRIG 234 (H) 04/18/2019   HDL 31 (L) 04/18/2019   CHOLHDL 6.2 (H) 04/18/2019   LDLCALC 124 (H) 04/18/2019    TARGET DATE:  The  31st of the month  Assessment: Jay Smith presents today for their maintenance Cabenuva injections. Initial/past injections were tolerated well without issues. No problems with systemic effects of injections.   Administered cabotegravir 400mg /55mL in left upper outer quadrant of the gluteal muscle. Administered rilpivirine 600 mg/68mL in the right upper outer quadrant of the gluteal muscle. Monitored patient for 10 minutes after injection. Injections were tolerated well without issue. Patient will follow up in 2 months for next injection. Will defer HIV RNA testing as it was recently assessed last month.   Patient declines all vaccinations today as he has in the past. Also declines STI testing today.   Plan: - Cabenuva injections administered - Next injections scheduled for 10/30 with me, 11/26 with me, and 12/30 with Tammy Sours  - Call with any issues or questions  Margarite Gouge, PharmD, CPP, BCIDP, AAHIVP Clinical Pharmacist Practitioner Infectious Diseases Clinical Pharmacist [facility]

## 2023-08-01 ENCOUNTER — Telehealth: Payer: Self-pay

## 2023-08-01 ENCOUNTER — Other Ambulatory Visit (HOSPITAL_COMMUNITY): Payer: Self-pay

## 2023-08-01 ENCOUNTER — Other Ambulatory Visit: Payer: Self-pay | Admitting: Pharmacist

## 2023-08-01 DIAGNOSIS — B20 Human immunodeficiency virus [HIV] disease: Secondary | ICD-10-CM

## 2023-08-01 MED ORDER — CABENUVA 400 & 600 MG/2ML IM SUER
1.0000 | INTRAMUSCULAR | 11 refills | Status: DC
Start: 2023-08-01 — End: 2023-08-28

## 2023-08-01 NOTE — Telephone Encounter (Signed)
[facility] Patient Advocate Encounter  Completed and sent [employer] Patient Assistance application for Cabenuva for this patient who is uninsured.    Patient assistance phone number for follow up is [phone].   This encounter will be updated until final determination.   Mateusz Neilan, CPhT Specialty Pharmacy Patient Advocate [facility] Phone: [phone] Fax:  [phone]  

## 2023-08-07 ENCOUNTER — Telehealth: Payer: Self-pay

## 2023-08-07 NOTE — Telephone Encounter (Signed)
[facility] Patient Advocate Encounter  Completed and sent [employer] Patient Assistance application for Cabenuva for this patient who is uninsured.    Patient is approved 08/07/23 through 08/06/24.  Patient ID #[id number]  Medication will be shipped to the office.   Clearance Coots, CPhT Specialty Pharmacy Patient [facility] for Infectious Disease Phone: [phone] Fax:  [phone]

## 2023-08-08 NOTE — Progress Notes (Unsigned)
HPI: Jay Smith is a 32 y.o. male who presents to the [facility] pharmacy clinic for [place] administration.  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   LIDOCAINE (LIDODERM) 5 %    Place 1 patch onto the skin daily. Remove & Discard patch within 12 hours or as directed by MD   ONDANSETRON (ZOFRAN-ODT) 4 MG DISINTEGRATING TABLET    Take 1 tablet (4 mg total) by mouth every 4 (four) hours as needed for nausea or vomiting.  Modified Medications   No medications on file  Discontinued Medications   No medications on file    Allergies: Allergies  Allergen Reactions   Dovato [Dolutegravir-Lamivudine] Other (See Comments)    Hypersensitivity reaction with diarrhea, skin peeling and headache.    Hydrocodone Swelling    Past Medical History: Past Medical History:  Diagnosis Date   Asthma    Eczema    HIV infection (HCC)     Social History: Social History   Socioeconomic History   Marital status: Single    Spouse name: Not on file   Number of children: Not on file   Years of education: Not on file   Highest education level: Not on file  Occupational History   Occupation: [employer] manager  Tobacco Use   Smoking status: Never   Smokeless tobacco: Never  Vaping Use   Vaping status: Never Used  Substance and Sexual Activity   Alcohol use: Yes    Comment: rare   Drug use: Yes    Types: Marijuana    Comment: socially   Sexual activity: Not Currently    Partners: Male    Birth control/protection: Condom    Comment:  declined condoms  Other Topics Concern   Not on file  Social History Narrative   Not on file   Social Determinants of Health   Financial Resource Strain: Not on file  Food Insecurity: Not on file  Transportation Needs: Not on file  Physical Activity: Not on file  Stress: Not on file  Social Connections: Unknown (04/28/2023)   Received from [facility]   Social Network    Social Network: Not on file    Labs: Lab Results  Component Value Date   HIV1RNAQUANT Not Detected 05/15/2023   HIV1RNAQUANT Not Detected 01/03/2023   HIV1RNAQUANT Not Detected 10/13/2022   CD4TABS 707 05/15/2023   CD4TABS 1,111 08/11/2022   CD4TABS 948 02/23/2022    RPR and STI Lab Results  Component Value Date   LABRPR REACTIVE (A) 05/15/2023   LABRPR NON-REACTIVE 10/13/2022   LABRPR REACTIVE (A) 02/23/2022   LABRPR REACTIVE (A) 08/16/2021   LABRPR NON-REACTIVE 05/21/2021   RPRTITER 1:1 (H) 05/15/2023   RPRTITER 1:1 (H) 02/23/2022   RPRTITER 1:1 (H) 08/16/2021   RPRTITER 1:1 (H) 12/07/2020   RPRTITER 1:4 (H) 09/07/2020    STI Results GC CT  06/06/2023 10:19 AM Negative    Negative    Negative  Negative    Negative    Negative   05/15/2023  11:33 AM Negative  Negative   10/13/2022  3:35 PM Negative  Negative   05/21/2021  8:59 AM Negative  Negative   12/07/2020  4:19 PM Negative  Negative   06/01/2018 12:00 AM Negative  Negative   02/12/2015 12:00 AM Negative  Negative     Hepatitis B Lab Results  Component Value Date   HEPBSAB BORDERLINE (A) 06/01/2018   HEPBSAG NON-REACTIVE 06/01/2018   HEPBCAB NON-REACTIVE 06/01/2018   Hepatitis C Lab Results  Component Value Date   HEPCAB NON-REACTIVE 06/01/2018   Hepatitis A Lab Results  Component Value Date   HAV NON-REACTIVE 06/01/2018   Lipids: Lab Results  Component Value Date   CHOL 193 04/18/2019   TRIG 234 (H) 04/18/2019   HDL 31 (L) 04/18/2019   CHOLHDL 6.2 (H) 04/18/2019   LDLCALC 124 (H) 04/18/2019    TARGET DATE:  The  31st of the month  Assessment: Jay Smith presents today for their maintenance Cabenuva injections. Initial/past injections were tolerated well without issues. No problems with systemic effects of injections. Recently uninsured; [employer] assistance approved earlier this week. States he applied for Medicaid yesterday and that it was already approved. Waiting for new card which he will share with [place] once available; Lupita Leash and Toni Amend made aware of this for his next visit.   Administered cabotegravir 600mg /34mL in left upper outer quadrant of the gluteal muscle. Administered rilpivirine 900 mg/91mL in the right upper outer quadrant of the gluteal muscle. Monitored patient for 10 minutes after injection. Injections were tolerated well without issue. Patient will follow up in 2 months for next injection. Will defer HIV RNA testing as it was recently assessed in August.  Continues to decline vaccines and STI testing today.   Plan: - Cabenuva injections administered - Next injections scheduled for 11/26 with me and 12/30 with Tammy Sours - Call with any issues or questions  Margarite Gouge, PharmD, CPP, BCIDP, AAHIVP Clinical Pharmacist Practitioner Infectious Diseases Clinical Pharmacist [facility]

## 2023-08-09 ENCOUNTER — Other Ambulatory Visit (HOSPITAL_COMMUNITY): Payer: Self-pay

## 2023-08-09 ENCOUNTER — Ambulatory Visit (INDEPENDENT_AMBULATORY_CARE_PROVIDER_SITE_OTHER): Payer: Medicaid Other | Admitting: Pharmacist

## 2023-08-09 ENCOUNTER — Telehealth: Payer: Self-pay

## 2023-08-09 ENCOUNTER — Other Ambulatory Visit: Payer: Self-pay

## 2023-08-09 DIAGNOSIS — B20 Human immunodeficiency virus [HIV] disease: Secondary | ICD-10-CM | POA: Diagnosis present

## 2023-08-09 MED ORDER — CABOTEGRAVIR & RILPIVIRINE ER 400 & 600 MG/2ML IM SUER
1.0000 | Freq: Once | INTRAMUSCULAR | Status: AC
Start: 2023-08-09 — End: 2023-08-09
  Administered 2023-08-09: 1 via INTRAMUSCULAR

## 2023-08-09 NOTE — Telephone Encounter (Signed)
[facility] Patient Advocate Encounter  Patient's medications (CABENUVA) have been couriered to [facility] from [facility] and will be administered at the patients appointment on 08/09/23.  [name] , CPhT Specialty Pharmacy Patient [facility] for Infectious Disease Phone: [phone] Fax:  [phone]

## 2023-08-28 ENCOUNTER — Other Ambulatory Visit: Payer: Self-pay | Admitting: Pharmacist

## 2023-08-28 ENCOUNTER — Other Ambulatory Visit: Payer: Self-pay

## 2023-08-28 ENCOUNTER — Other Ambulatory Visit (HOSPITAL_COMMUNITY): Payer: Self-pay

## 2023-08-28 DIAGNOSIS — B20 Human immunodeficiency virus [HIV] disease: Secondary | ICD-10-CM

## 2023-08-28 MED ORDER — CABENUVA 400 & 600 MG/2ML IM SUER
1.0000 | INTRAMUSCULAR | 11 refills | Status: DC
Start: 2023-08-28 — End: 2024-08-21
  Filled 2023-08-28: qty 4, 30d supply, fill #0
  Filled 2023-09-27: qty 4, 30d supply, fill #1
  Filled 2023-10-24: qty 4, 30d supply, fill #2
  Filled 2023-11-20: qty 4, 30d supply, fill #3
  Filled 2023-12-18: qty 4, 30d supply, fill #4
  Filled 2024-01-17: qty 4, 30d supply, fill #5
  Filled 2024-02-12: qty 4, 30d supply, fill #6
  Filled 2024-03-25: qty 4, 30d supply, fill #7
  Filled 2024-04-22: qty 4, 30d supply, fill #8
  Filled 2024-06-03: qty 4, 30d supply, fill #9
  Filled 2024-06-26: qty 4, 30d supply, fill #10
  Filled 2024-07-24: qty 4, 30d supply, fill #11

## 2023-08-28 NOTE — Progress Notes (Signed)
Specialty Pharmacy Initial Fill Coordination Note  KENGO BOYAJIAN is a 32 y.o. male contacted today regarding refills of specialty medication(s) Cabotegravir & Rilpivirine   Patient requested Courier to Provider Office   Delivery date: 08/31/23   Verified address: [facility] [address] [place] [address]   Medication will be filled on 08/30/23.   Patient is aware of 0.00 copayment.

## 2023-08-30 ENCOUNTER — Other Ambulatory Visit (HOSPITAL_COMMUNITY): Payer: Self-pay

## 2023-08-31 ENCOUNTER — Other Ambulatory Visit (HOSPITAL_COMMUNITY): Payer: Self-pay

## 2023-08-31 ENCOUNTER — Telehealth: Payer: Self-pay

## 2023-08-31 NOTE — Telephone Encounter (Signed)
 [facility] Patient Advocate Encounter  Patient's medications CABENUVA have been couriered to [facility] from [facility] and will be administered at the patients appointment on 09/05/23.  [name], CPhT Specialty Pharmacy Patient [facility] for Infectious Disease Phone: [phone] Fax:  [phone]

## 2023-09-04 NOTE — Progress Notes (Unsigned)
HPI: Jay Smith is a 32 y.o. male who presents to the [facility] pharmacy clinic for [place] administration.  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   LIDOCAINE (LIDODERM) 5 %    Place 1 patch onto the skin daily. Remove & Discard patch within 12 hours or as directed by MD   ONDANSETRON (ZOFRAN-ODT) 4 MG DISINTEGRATING TABLET    Take 1 tablet (4 mg total) by mouth every 4 (four) hours as needed for nausea or vomiting.  Modified Medications   No medications on file  Discontinued Medications   No medications on file    Allergies: Allergies  Allergen Reactions   Dovato [Dolutegravir-Lamivudine] Other (See Comments)    Hypersensitivity reaction with diarrhea, skin peeling and headache.    Hydrocodone Swelling    Labs: Lab Results  Component Value Date   HIV1RNAQUANT Not Detected 05/15/2023   HIV1RNAQUANT Not Detected 01/03/2023   HIV1RNAQUANT Not Detected 10/13/2022   CD4TABS 707 05/15/2023   CD4TABS 1,111 08/11/2022   CD4TABS 948 02/23/2022    RPR and STI Lab Results  Component Value Date   LABRPR REACTIVE (A) 05/15/2023   LABRPR NON-REACTIVE 10/13/2022   LABRPR REACTIVE (A) 02/23/2022   LABRPR REACTIVE (A) 08/16/2021   LABRPR NON-REACTIVE 05/21/2021   RPRTITER 1:1 (H) 05/15/2023   RPRTITER 1:1 (H) 02/23/2022   RPRTITER 1:1 (H) 08/16/2021   RPRTITER 1:1 (H) 12/07/2020   RPRTITER 1:4 (H) 09/07/2020    STI Results GC CT  06/06/2023 10:19 AM Negative    Negative    Negative  Negative    Negative     Negative   05/15/2023 11:33 AM Negative  Negative   10/13/2022  3:35 PM Negative  Negative   05/21/2021  8:59 AM Negative  Negative   12/07/2020  4:19 PM Negative  Negative   06/01/2018 12:00 AM Negative  Negative   02/12/2015 12:00 AM Negative  Negative     Hepatitis B Lab Results  Component Value Date   HEPBSAB BORDERLINE (A) 06/01/2018   HEPBSAG NON-REACTIVE 06/01/2018   HEPBCAB NON-REACTIVE 06/01/2018   Hepatitis C Lab Results  Component Value Date   HEPCAB NON-REACTIVE 06/01/2018   Hepatitis A Lab Results  Component Value Date   HAV NON-REACTIVE 06/01/2018   Lipids: Lab Results  Component Value Date   CHOL 193 04/18/2019   TRIG 234 (H) 04/18/2019   HDL 31 (L) 04/18/2019   CHOLHDL 6.2 (H) 04/18/2019   LDLCALC 124 (H) 04/18/2019    TARGET DATE: The 31st of the month  Assessment: Jay Smith presents today for their maintenance Cabenuva injections. Past injections were tolerated well without issues. Jay Smith restarted [place] on 06/10/23 with monthly dosing due to low level viremia maintained on every 2 month dosing. He *** STI testing today.   Administered cabotegravir 400mg /49mL in left upper outer quadrant of the gluteal muscle. Administered rilpivirine 600 mg/89mL in the right upper outer quadrant of the gluteal muscle. No issues with injections. Jay Smith will follow up in 2 months for  next set of injections.  Immunizations: Due for COVID, influenza, PCV20, Menveo, Heptitis A 1/2, and potentially HPV 1/3. Jay Smith *** these today.  Plan: - Cabenuva injections administered - VL? *** - Immunizations: *** - STI testing: *** - Next injections scheduled for *** - Call with any issues or questions  Lora Paula, PharmD PGY-2 Infectious Diseases Pharmacy Resident [facility] for Infectious Disease

## 2023-09-05 ENCOUNTER — Other Ambulatory Visit: Payer: Self-pay

## 2023-09-05 ENCOUNTER — Ambulatory Visit (INDEPENDENT_AMBULATORY_CARE_PROVIDER_SITE_OTHER): Payer: Medicaid Other | Admitting: Pharmacist

## 2023-09-05 DIAGNOSIS — B20 Human immunodeficiency virus [HIV] disease: Secondary | ICD-10-CM | POA: Diagnosis present

## 2023-09-05 MED ORDER — CABOTEGRAVIR & RILPIVIRINE ER 600 & 900 MG/3ML IM SUER
1.0000 | Freq: Once | INTRAMUSCULAR | Status: AC
  Administered 2023-09-05: 1 via INTRAMUSCULAR

## 2023-09-14 ENCOUNTER — Other Ambulatory Visit: Payer: Self-pay

## 2023-09-21 ENCOUNTER — Other Ambulatory Visit: Payer: Self-pay

## 2023-09-21 ENCOUNTER — Emergency Department (HOSPITAL_BASED_OUTPATIENT_CLINIC_OR_DEPARTMENT_OTHER)
Admission: EM | Admit: 2023-09-21 | Discharge: 2023-09-21 | Disposition: A | Discharge status: Home / Self Care | Payer: Medicaid Other | Attending: Emergency Medicine | Admitting: Emergency Medicine

## 2023-09-21 DIAGNOSIS — R519 Headache, unspecified: Secondary | ICD-10-CM | POA: Insufficient documentation

## 2023-09-21 MED ORDER — KETOROLAC TROMETHAMINE 15 MG/ML IJ SOLN
15.0000 mg | Freq: Once | INTRAMUSCULAR | Status: AC
  Administered 2023-09-21: 15 mg via INTRAMUSCULAR
  Filled 2023-09-21: qty 1

## 2023-09-21 MED ORDER — PROCHLORPERAZINE MALEATE 10 MG PO TABS
10.0000 mg | ORAL_TABLET | Freq: Once | ORAL | Status: AC
  Administered 2023-09-21: 10 mg via ORAL
  Filled 2023-09-21: qty 1

## 2023-09-21 MED ORDER — DIPHENHYDRAMINE HCL 25 MG PO CAPS
25.0000 mg | ORAL_CAPSULE | Freq: Once | ORAL | Status: AC
  Administered 2023-09-21: 25 mg via ORAL
  Filled 2023-09-21: qty 1

## 2023-09-21 NOTE — ED Provider Notes (Signed)
[place] EMERGENCY DEPARTMENT AT [facility] Provider Note   CSN: [id number] Arrival date & time: 09/21/23  1018     History No chief complaint on file.   Jay Smith is a 32 y.o. male patient who presents to the emergency department with a left occipital headache that is been there for 3 days.  It is intermittent in nature and he describes it as a sharp sensation.  Denies any focal weakness or numbness, trouble walking or talking.  Denies any fever, chills.  Has been taken over-the-counter Goody powder with mild relief.  HPI     Home Medications Prior to Admission medications   Medication Sig Start Date End Date Taking? Authorizing Provider  acetaminophen (MAPAP) 500 MG tablet     [provider]  cabotegravir & rilpivirine ER (CABENUVA) 400 & 600 MG/2ML injection Inject 1 kit into the muscle every 30 (thirty) days. 08/28/23   Jennette Kettle, RPH-CPP  lidocaine (LIDODERM) 5 % Place 1 patch onto the skin daily. Remove & Discard patch within 12 hours or as directed by MD 02/02/23   Mardene Sayer, MD  ondansetron (ZOFRAN-ODT) 4 MG disintegrating tablet Take 1 tablet (4 mg total) by mouth every 4 (four) hours as needed for nausea or vomiting. 02/08/23   Arby Barrette, MD      Allergies    Dovato [dolutegravir-lamivudine] and Hydrocodone    Review of Systems   Review of Systems  All other systems reviewed and are negative.   Physical Exam Updated Vital Signs BP 125/79   Pulse (!) 53   Temp 97.9 F (36.6 C) (Oral)   Resp 18   SpO2 99%  Physical Exam Vitals and nursing note reviewed.  Constitutional:      Appearance: Normal appearance.  HENT:     Head: Normocephalic and atraumatic.  Eyes:     General:        Right eye: No discharge.        Left eye: No discharge.     Conjunctiva/sclera: Conjunctivae normal.  Pulmonary:     Effort: Pulmonary effort is normal.  Skin:    General: Skin is warm and dry.     Findings: No rash.   Neurological:     General: No focal deficit present.     Mental Status: He is alert.     Comments: Cranial nerves II through XII are intact.  5/5 strength to the upper and lower extremities.  Normal sensation to the upper and lower extremities.  No dysmetria with finger-to-nose.  Extraocular movements are intact without entrapment or nystagmus.  Speech is normal.  Mood is normal.  Psychiatric:        Mood and Affect: Mood normal.        Behavior: Behavior normal.     ED Results / Procedures / Treatments   Labs (all labs ordered are listed, but only abnormal results are displayed) Labs Reviewed - No data to display  EKG None  Radiology No results found.  Procedures Procedures    Medications Ordered in ED Medications  ketorolac (TORADOL) 15 MG/ML injection 15 mg (15 mg Intramuscular Given 09/21/23 1109)  diphenhydrAMINE (BENADRYL) capsule 25 mg (25 mg Oral Given 09/21/23 1109)  prochlorperazine (COMPAZINE) tablet 10 mg (10 mg Oral Given 09/21/23 1109)    ED Course/ Medical Decision Making/ A&P Clinical Course as of 09/21/23 1236  Thu Sep 21, 2023  1232 On reevaluation, patient is feeling much better.  He states his headache has  improved.  I went over all red flag symptoms with him at the bedside.  He expressed full understanding.  Will continue to treat conservatively for now.  He is safe for discharge. [CF]    Clinical Course User Index [CF] Teressa Lower, PA-C   {   Click here for ABCD2, HEART and other calculators  Medical Decision Making DEONTAI VITALE is a 32 y.o. male patient who presents to the emergency department today for further evaluation of left occipital headache.  Stroke is certainly considered however less likely considering the patient's neurological exam is completely normal.  Vital signs are normal.  I have a low suspicion for infection as he does not have any systemic signs.  This could be a benign headache.  Do not feel imaging is warranted  at this time given his negative physical exam.  Will monitor closely.  Will plan to give him Toradol, Benadryl, and Compazine and reassess.  Patient agreeable with plan and shared decision making was done with the CT scan and patient agrees that he would like to hold off.  As highlighted in ED course, patient feeling better after migraine cocktail.  Will treat conservatively for now.  Strict return precautions were discussed.  He is safe for discharge at this time.  Risk Prescription drug management.    Final Clinical Impression(s) / ED Diagnoses Final diagnoses:  Acute nonintractable headache, unspecified headache type    Rx / DC Orders ED Discharge Orders     None         Jolyn Lent 09/21/23 1236    Benjiman Core, MD 09/25/23 1440

## 2023-09-21 NOTE — ED Notes (Signed)
Reviewed discharge instructions and home care with pt. Pt verbalized understanding and had no further questions. Pt exited ED without complications.

## 2023-09-21 NOTE — ED Triage Notes (Signed)
Pt c/o occipital/ L sided HA x3 days, denies photosensitivity, NV. Denies migraine hx  No meds PTA

## 2023-09-21 NOTE — Discharge Instructions (Signed)
As we discussed, I would like for you to return to the emergency department for any symptoms that include but are not limited to worsening headache, facial droop, weakness in your arms or legs, numbness in your arms or legs, visual difficulty, or any other concerns you might have.  Otherwise, I would like for you to follow-up with your primary care doctor if you have one.

## 2023-09-27 ENCOUNTER — Other Ambulatory Visit (HOSPITAL_COMMUNITY): Payer: Self-pay

## 2023-09-27 ENCOUNTER — Other Ambulatory Visit: Payer: Self-pay

## 2023-09-27 NOTE — Progress Notes (Signed)
Specialty Pharmacy Refill Coordination Note  Jay Smith is a 32 y.o. male assessed today regarding refills of clinic administered specialty medication(s) Cabotegravir & Rilpivirine [name])   Clinic requested Courier to Provider Office   Delivery date: 09/29/23   Verified address: [address] [place] [address]   Medication will be filled on 09/28/23.

## 2023-09-29 ENCOUNTER — Telehealth: Payer: Self-pay

## 2023-09-29 NOTE — Telephone Encounter (Signed)
[facility] Patient Advocate Encounter  Patient's medications CABENUVA have been couriered to [facility] from [facility] pharmacy and will be administered at the patients appointment on 10/09/23.  [name], CPhT Specialty Pharmacy Patient [facility] for Infectious Disease Phone: [phone] Fax:  [phone]

## 2023-10-09 ENCOUNTER — Ambulatory Visit: Payer: Medicaid Other | Admitting: Family

## 2023-10-16 NOTE — Progress Notes (Signed)
 HPI: Jay Smith is a 33 y.o. male who presents to the [facility] pharmacy clinic for Cabenuva  administration.  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   LIDOCAINE  (LIDODERM ) 5 %    Place 1 patch onto the skin daily. Remove & Discard patch within 12 hours or as directed by MD   ONDANSETRON  (ZOFRAN -ODT) 4 MG DISINTEGRATING TABLET    Take 1 tablet (4 mg total) by mouth every 4 (four) hours as needed for nausea or vomiting.  Modified Medications   No medications on file  Discontinued Medications   No medications on file    Allergies: Allergies  Allergen Reactions   Dovato  [Dolutegravir-Lamivudine] Other (See Comments)    Hypersensitivity reaction with diarrhea, skin peeling and headache.    Hydrocodone Swelling    Labs: Lab Results  Component Value Date   HIV1RNAQUANT Not Detected 05/15/2023   HIV1RNAQUANT Not Detected 01/03/2023   HIV1RNAQUANT Not Detected 10/13/2022   CD4TABS 707 05/15/2023   CD4TABS 1,111 08/11/2022   CD4TABS 948 02/23/2022    RPR and STI Lab Results  Component Value Date   LABRPR REACTIVE (A) 05/15/2023   LABRPR NON-REACTIVE 10/13/2022   LABRPR REACTIVE (A) 02/23/2022   LABRPR REACTIVE (A) 08/16/2021   LABRPR NON-REACTIVE 05/21/2021   RPRTITER 1:1 (H) 05/15/2023   RPRTITER 1:1 (H) 02/23/2022   RPRTITER 1:1 (H) 08/16/2021   RPRTITER 1:1 (H) 12/07/2020   RPRTITER 1:4 (H) 09/07/2020    STI Results GC CT  06/06/2023 10:19 AM Negative    Negative    Negative  Negative    Negative     Negative   05/15/2023 11:33 AM Negative  Negative   10/13/2022  3:35 PM Negative  Negative   05/21/2021  8:59 AM Negative  Negative   12/07/2020  4:19 PM Negative  Negative   06/01/2018 12:00 AM Negative  Negative   02/12/2015 12:00 AM Negative  Negative     Hepatitis B Lab Results  Component Value Date   HEPBSAB BORDERLINE (A) 06/01/2018   HEPBSAG NON-REACTIVE 06/01/2018   HEPBCAB NON-REACTIVE 06/01/2018   Hepatitis C Lab Results  Component Value Date   HEPCAB NON-REACTIVE 06/01/2018   Hepatitis A Lab Results  Component Value Date   HAV NON-REACTIVE 06/01/2018   Lipids: Lab Results  Component Value Date   CHOL 193 04/18/2019   TRIG 234 (H) 04/18/2019   HDL 31 (L) 04/18/2019   CHOLHDL 6.2 (H) 04/18/2019   LDLCALC 124 (H) 04/18/2019    TARGET DATE: The 31st of the month  Assessment: Jay Smith presents today for his maintenance Cabenuva  injections. Past injections were tolerated well without issues. Was originally scheduled with Cathlyn on 10/09/23, but moved to today with pharmacy team. Thankfully, we are still in window. Consider labs at next visit, Jay Smith is aware of this. Recommend following up on lipids with next labs. Jay Smith declines STI testing today, he reports not sexually active.   Note slightly elevated LDL 04/18/19: 124. Given his age <40, he does not fit  into the population of the REPRIEVE study for now, but will monitor for primary hypercholesterolemia. Discussed this with him, he would be amenable to a statin in the future.  Administered cabotegravir  400 mg in left upper outer quadrant of the gluteal muscle. Administered rilpivirine  600 mg in the right upper outer quadrant of the gluteal muscle. No issues with injections. Jay Smith will follow up in 2 months for next set of injections.  Plan: - Cabenuva  injections administered - Next injections scheduled for 12/05/23 with Alan, and 12/07/23 with Cathlyn. - Call with any issues or questions  Con Laughter, PharmD PGY-2  Infectious Diseases Pharmacy Resident [facility] for Infectious Disease

## 2023-10-17 ENCOUNTER — Other Ambulatory Visit: Payer: Self-pay

## 2023-10-17 ENCOUNTER — Ambulatory Visit: Payer: Medicaid Other | Admitting: Pharmacist

## 2023-10-17 DIAGNOSIS — B20 Human immunodeficiency virus [HIV] disease: Secondary | ICD-10-CM | POA: Diagnosis present

## 2023-10-17 MED ORDER — CABOTEGRAVIR & RILPIVIRINE ER 400 & 600 MG/2ML IM SUER
1.0000 | Freq: Once | INTRAMUSCULAR | Status: AC
  Administered 2023-10-17: 1 via INTRAMUSCULAR

## 2023-10-24 ENCOUNTER — Other Ambulatory Visit (HOSPITAL_COMMUNITY): Payer: Self-pay

## 2023-10-24 ENCOUNTER — Other Ambulatory Visit: Payer: Self-pay

## 2023-10-24 NOTE — Progress Notes (Signed)
 Specialty Pharmacy Refill Coordination Note  Jay Smith is a 33 y.o. male assessed today regarding refills of clinic administered specialty medication(s) Cabotegravir  & Rilpivirine  (Cabenuva )   Clinic requested Courier to Provider Office   Delivery date: 11/01/23   Verified address: [address] [place] [address]   Medication will be filled on 10/31/23.

## 2023-10-31 ENCOUNTER — Other Ambulatory Visit: Payer: Self-pay

## 2023-11-01 ENCOUNTER — Telehealth: Payer: Self-pay

## 2023-11-01 NOTE — Telephone Encounter (Signed)
 [facility] Patient Advocate Encounter  Patient's medications CABENUVA have been couriered to [facility] from [facility] Specialty pharmacy and will be administered at the patients appointment on 11/07/23.  [name], CPhT Specialty Pharmacy Patient [facility] for Infectious Disease Phone: [phone] Fax:  [phone]

## 2023-11-06 NOTE — Progress Notes (Unsigned)
HPI: Jay Smith is a 33 y.o. male who presents to the [facility] pharmacy clinic for [place] administration.  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   LIDOCAINE (LIDODERM) 5 %    Place 1 patch onto the skin daily. Remove & Discard patch within 12 hours or as directed by MD   ONDANSETRON (ZOFRAN-ODT) 4 MG DISINTEGRATING TABLET    Take 1 tablet (4 mg total) by mouth every 4 (four) hours as needed for nausea or vomiting.  Modified Medications   No medications on file  Discontinued Medications   No medications on file    Allergies: Allergies  Allergen Reactions   Dovato [Dolutegravir-Lamivudine] Other (See Comments)    Hypersensitivity reaction with diarrhea, skin peeling and headache.    Hydrocodone Swelling    Labs: Lab Results  Component Value Date   HIV1RNAQUANT Not Detected 05/15/2023   HIV1RNAQUANT Not Detected 01/03/2023   HIV1RNAQUANT Not Detected 10/13/2022   CD4TABS 707 05/15/2023   CD4TABS 1,111 08/11/2022   CD4TABS 948 02/23/2022    RPR and STI Lab Results  Component Value Date   LABRPR REACTIVE (A) 05/15/2023   LABRPR NON-REACTIVE 10/13/2022   LABRPR REACTIVE (A) 02/23/2022   LABRPR REACTIVE (A) 08/16/2021   LABRPR NON-REACTIVE 05/21/2021   RPRTITER 1:1 (H) 05/15/2023   RPRTITER 1:1 (H) 02/23/2022   RPRTITER 1:1 (H) 08/16/2021   RPRTITER 1:1 (H) 12/07/2020   RPRTITER 1:4 (H) 09/07/2020    STI Results GC CT  06/06/2023 10:19 AM Negative    Negative    Negative  Negative    Negative     Negative   05/15/2023 11:33 AM Negative  Negative   10/13/2022  3:35 PM Negative  Negative   05/21/2021  8:59 AM Negative  Negative   12/07/2020  4:19 PM Negative  Negative   06/01/2018 12:00 AM Negative  Negative   02/12/2015 12:00 AM Negative  Negative     Hepatitis B Lab Results  Component Value Date   HEPBSAB BORDERLINE (A) 06/01/2018   HEPBSAG NON-REACTIVE 06/01/2018   HEPBCAB NON-REACTIVE 06/01/2018   Hepatitis C Lab Results  Component Value Date   HEPCAB NON-REACTIVE 06/01/2018   Hepatitis A Lab Results  Component Value Date   HAV NON-REACTIVE 06/01/2018   Lipids: Lab Results  Component Value Date   CHOL 193 04/18/2019   TRIG 234 (H) 04/18/2019   HDL 31 (L) 04/18/2019   CHOLHDL 6.2 (H) 04/18/2019   LDLCALC 124 (H) 04/18/2019    TARGET DATE: 31st  Assessment: Jay Smith presents today for his maintenance Cabenuva injections. Past injections were tolerated well without issues. Last HIV RNA was undetectable and CD4 was 707 from 05/15/23. Will collect and update HIV RNA today. Last STI testing was negative for chlamydia/gonorrhea with oral/urine/rectal cytologies from 06/06/23. RPR titer from 05/15/23 was 1:1.   Labs: Due for updated CBC with most recent collection from 09/08/22. Most recent lipid panel was from 04/2019, with triglycerides (234) and LDL (124) being elevated. Due for an updated  lipid panel on 04/2024. Most recent BMP and LFTs from 05/2023 and 02/2023. Will collect and update CMP today.   Vaccination: HepA Ab from 05/2017 was non-reactive and HepB surface Ab from 05/2018 was borderline. Will administer HepA 1/2 and HepB 1/2 vaccines today. Also due for PCV20, COVID-19, and influenza vaccines today.   Administered cabotegravir 600mg /52mL in left upper outer quadrant of the gluteal muscle. Administered rilpivirine 900 mg/17mL in the right upper outer quadrant of the gluteal muscle. No issues with injections. Jay Smith will follow up in 1 month for next set of  injections.  Plan: - Cabenuva injections administered - Next injections scheduled for 12/07/23 with Tammy Sours - HIV RNA and CMP on next follow up visit with Tammy Sours on 12/07/23 - Administer HepA 1/2 and HepB 1/2 vaccines today. HepA 2/2 due on 04/2024. HepB 2/2 due on next follow visit in 1 month.  - Administer PCV20, COVID-19, and influenza vaccines today.  - Call with any issues or questions  Dimple Casey. Edwena Blow, PharmD Candidate [facility] [employer]

## 2023-11-07 ENCOUNTER — Ambulatory Visit: Payer: Medicaid Other | Admitting: Pharmacist

## 2023-11-07 ENCOUNTER — Encounter: Payer: Self-pay | Admitting: Pharmacist

## 2023-11-07 DIAGNOSIS — B20 Human immunodeficiency virus [HIV] disease: Secondary | ICD-10-CM

## 2023-11-09 NOTE — Progress Notes (Signed)
HPI: Jay Smith is a 33 y.o. male who presents to the [facility] pharmacy clinic for [place] administration.  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   LIDOCAINE (LIDODERM) 5 %    Place 1 patch onto the skin daily. Remove & Discard patch within 12 hours or as directed by MD   ONDANSETRON (ZOFRAN-ODT) 4 MG DISINTEGRATING TABLET    Take 1 tablet (4 mg total) by mouth every 4 (four) hours as needed for nausea or vomiting.  Modified Medications   No medications on file  Discontinued Medications   No medications on file    Allergies: Allergies  Allergen Reactions   Dovato [Dolutegravir-Lamivudine] Other (See Comments)    Hypersensitivity reaction with diarrhea, skin peeling and headache.    Hydrocodone Swelling    Labs: Lab Results  Component Value Date   HIV1RNAQUANT Not Detected 05/15/2023   HIV1RNAQUANT Not Detected 01/03/2023   HIV1RNAQUANT Not Detected 10/13/2022   CD4TABS 707 05/15/2023   CD4TABS 1,111 08/11/2022   CD4TABS 948 02/23/2022    RPR and STI Lab Results  Component Value Date   LABRPR REACTIVE (A) 05/15/2023   LABRPR NON-REACTIVE 10/13/2022   LABRPR REACTIVE (A) 02/23/2022   LABRPR REACTIVE (A) 08/16/2021   LABRPR NON-REACTIVE 05/21/2021   RPRTITER 1:1 (H) 05/15/2023   RPRTITER 1:1 (H) 02/23/2022   RPRTITER 1:1 (H) 08/16/2021   RPRTITER 1:1 (H) 12/07/2020   RPRTITER 1:4 (H) 09/07/2020    STI Results GC CT  06/06/2023 10:19 AM Negative    Negative    Negative  Negative    Negative     Negative   05/15/2023 11:33 AM Negative  Negative   10/13/2022  3:35 PM Negative  Negative   05/21/2021  8:59 AM Negative  Negative   12/07/2020  4:19 PM Negative  Negative   06/01/2018 12:00 AM Negative  Negative   02/12/2015 12:00 AM Negative  Negative     Hepatitis B Lab Results  Component Value Date   HEPBSAB BORDERLINE (A) 06/01/2018   HEPBSAG NON-REACTIVE 06/01/2018   HEPBCAB NON-REACTIVE 06/01/2018   Hepatitis C Lab Results  Component Value Date   HEPCAB NON-REACTIVE 06/01/2018   Hepatitis A Lab Results  Component Value Date   HAV NON-REACTIVE 06/01/2018   Lipids: Lab Results  Component Value Date   CHOL 193 04/18/2019   TRIG 234 (H) 04/18/2019   HDL 31 (L) 04/18/2019   CHOLHDL 6.2 (H) 04/18/2019   LDLCALC 124 (H) 04/18/2019    TARGET DATE: 31st   Assessment: Jay Smith presents today for his maintenance Cabenuva injections. Past injections were tolerated well without issues. Last HIV RNA was undetectable and CD4 was 707 from 05/15/23. Last STI testing was negative for chlamydia/gonorrhea with oral/urine/rectal cytologies from 06/06/23. RPR titer from 05/15/23 was 1:1. Denies signs and symptoms of STI today. Agrees to full STI testing with RPR and oral/urine/rectal cytologies to test for syphilis/chlamydia/gonorrhea.    Labs: Most recent CBC was from 09/08/22. Most recent lipid panel was from 04/2019,  with triglycerides (234) and LDL (124) being elevated. Most recent BMP and LFTs from 05/2023 and 02/2023. Will defer CMP, CBC, and lipid panel on next follow up visit with Tammy Sours on 12/07/23.    Vaccination: HepA Ab from 05/2017 was non-reactive. Will recommend HepA 1/2 vaccine today. Also due for PCV20, COVID-19, and influenza vaccines today. Declined all vaccines today.    Administered cabotegravir 600mg /16mL in left upper outer quadrant of the gluteal muscle. Administered rilpivirine 900 mg/59mL in the right upper outer quadrant of the gluteal muscle. No issues with injections. Jay Smith  will follow up in 1 month for next set of injections.   Plan: - Cabenuva injections administered - Full STI testing with RPR and oral/urine/rectal cytologies - Next injections scheduled for 12/07/23 with Tammy Sours - CMP, CBC, and lipid panel on next follow up visit with Tammy Sours on 12/07/23 - Call with any issues or questions   Dimple Casey. Edwena Blow, PharmD Candidate [facility] [employer]

## 2023-11-10 ENCOUNTER — Other Ambulatory Visit: Payer: Self-pay

## 2023-11-10 ENCOUNTER — Ambulatory Visit (INDEPENDENT_AMBULATORY_CARE_PROVIDER_SITE_OTHER): Payer: Medicaid Other | Admitting: Pharmacist

## 2023-11-10 DIAGNOSIS — Z113 Encounter for screening for infections with a predominantly sexual mode of transmission: Secondary | ICD-10-CM

## 2023-11-10 DIAGNOSIS — B20 Human immunodeficiency virus [HIV] disease: Secondary | ICD-10-CM | POA: Diagnosis present

## 2023-11-10 MED ORDER — CABOTEGRAVIR & RILPIVIRINE ER 400 & 600 MG/2ML IM SUER
1.0000 | Freq: Once | INTRAMUSCULAR | Status: AC
  Administered 2023-11-10: 1 via INTRAMUSCULAR

## 2023-11-13 NOTE — Progress Notes (Signed)
 HPI: Jay Smith is a 33 y.o. male who presents to the [facility] pharmacy clinic for syphilis treatment.  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   LIDOCAINE  (LIDODERM ) 5 %    Place 1 patch onto the skin daily. Remove & Discard patch within 12 hours or as directed by MD   ONDANSETRON  (ZOFRAN -ODT) 4 MG DISINTEGRATING TABLET    Take 1 tablet (4 mg total) by mouth every 4 (four) hours as needed for nausea or vomiting.  Modified Medications   No medications on file  Discontinued Medications   No medications on file    Allergies: Allergies  Allergen Reactions   Dovato  [Dolutegravir-Lamivudine] Other (See Comments)    Hypersensitivity reaction with diarrhea, skin peeling and headache.    Hydrocodone Swelling    Past Medical History: Past Medical History:  Diagnosis Date   Asthma    Eczema    HIV infection (HCC)     Social History: Social History   Socioeconomic History   Marital status: Single    Spouse name: Not on file   Number of children: Not on file   Years of education: Not on file   Highest education level: Not on file  Occupational History   Occupation: Heritage Manager  Tobacco Use   Smoking status: Never   Smokeless tobacco: Never  Vaping Use   Vaping status: Never Used  Substance and Sexual Activity   Alcohol use: Yes    Comment: rare   Drug use: Yes    Types: Marijuana    Comment: socially   Sexual activity: Not Currently    Partners: Male    Birth control/protection: Condom    Comment:  declined condoms  Other Topics Concern   Not on file  Social History Narrative   Not on file   Social Drivers of Health   Financial Resource Strain: Not on file  Food Insecurity: Not on file  Transportation Needs: Not on file  Physical Activity: Not on file  Stress: Not on file  Social Connections: Unknown (04/28/2023)   Received from [facility]   Social Network    Social Network: Not on file    Labs: Lab Results  Component Value Date   HIV1RNAQUANT Not Detected 05/15/2023   HIV1RNAQUANT Not Detected 01/03/2023   HIV1RNAQUANT Not Detected 10/13/2022   CD4TABS 707 05/15/2023   CD4TABS 1,111 08/11/2022   CD4TABS 948 02/23/2022    RPR and STI Lab Results  Component Value Date   LABRPR REACTIVE (A) 11/10/2023   LABRPR REACTIVE (A) 05/15/2023   LABRPR NON-REACTIVE 10/13/2022   LABRPR REACTIVE (A) 02/23/2022   LABRPR REACTIVE (A) 08/16/2021   RPRTITER 1:4 (H) 11/10/2023   RPRTITER 1:1 (H) 05/15/2023   RPRTITER 1:1 (H) 02/23/2022   RPRTITER 1:1 (H) 08/16/2021   RPRTITER 1:1 (H) 12/07/2020    STI Results GC CT  06/06/2023 10:19 AM Negative    Negative    Negative  Negative    Negative    Negative  05/15/2023 11:33 AM Negative  Negative   10/13/2022  3:35 PM Negative  Negative   05/21/2021  8:59 AM Negative  Negative   12/07/2020  4:19 PM Negative  Negative   06/01/2018 12:00 AM Negative  Negative   02/12/2015 12:00 AM Negative  Negative     Hepatitis B Lab Results  Component Value Date   HEPBSAB BORDERLINE (A) 06/01/2018   HEPBSAG NON-REACTIVE 06/01/2018   HEPBCAB NON-REACTIVE 06/01/2018   Hepatitis C Lab Results  Component Value Date   HEPCAB NON-REACTIVE 06/01/2018   Hepatitis A Lab Results  Component Value Date   HAV NON-REACTIVE 06/01/2018   Lipids: Lab Results  Component Value Date   CHOL 193 04/18/2019   TRIG 234 (H) 04/18/2019   HDL 31 (L) 04/18/2019   CHOLHDL 6.2 (H) 04/18/2019   LDLCALC 124 (H) 04/18/2019    Assessment: Jay Smith  presents to clinic today for syphilis treatment. Patient was tested for STIs on Friday. RPR was 1:4, previously 1:1 in August. No chancre, ulcers, discharge, or rashes. No antibiotic allergies. Advised patient to disclose their results to all partners and have partners tested and treated. Partners are welcome to come to [facility] for testing and treatment.   Administered Bicillin  2.4 million units (1.2 million units on each upper outer quadrant of gluteal muscles). Patient tolerated well. Advised to abstain from sexual activity for 7-10 days. Counseled that he will probably be contacted by the health department for monitoring.    Plan: - Bicillin  2.4 million units x 1 - Abstain from sex for 7-10 days - Follow up on 2/27 with Cathlyn for HIV management and Cabenuva  injection   Alan Geralds, PharmD, CPP, BCIDP, AAHIVP Clinical Pharmacist Practitioner Infectious Diseases Clinical Pharmacist [facility] 11/13/2023, 11:40 AM

## 2023-11-14 ENCOUNTER — Ambulatory Visit (INDEPENDENT_AMBULATORY_CARE_PROVIDER_SITE_OTHER): Payer: Medicaid Other | Admitting: Pharmacist

## 2023-11-14 ENCOUNTER — Other Ambulatory Visit: Payer: Self-pay

## 2023-11-14 DIAGNOSIS — A539 Syphilis, unspecified: Secondary | ICD-10-CM | POA: Diagnosis present

## 2023-11-14 LAB — RPR: RPR Ser Ql: REACTIVE — AB

## 2023-11-14 LAB — CT/NG RNA, TMA RECTAL
Chlamydia Trachomatis RNA: NOT DETECTED
Neisseria Gonorrhoeae RNA: NOT DETECTED

## 2023-11-14 LAB — C. TRACHOMATIS/N. GONORRHOEAE RNA
C. trachomatis RNA, TMA: NOT DETECTED
N. gonorrhoeae RNA, TMA: NOT DETECTED

## 2023-11-14 LAB — T PALLIDUM AB: T Pallidum Abs: POSITIVE — AB

## 2023-11-14 LAB — RPR TITER: RPR Titer: 1:4 {titer} — ABNORMAL HIGH

## 2023-11-14 LAB — GC/CHLAMYDIA PROBE, AMP (THROAT)
Chlamydia trachomatis RNA: NOT DETECTED
Neisseria gonorrhoeae RNA: NOT DETECTED

## 2023-11-14 MED ORDER — PENICILLIN G BENZATHINE 1200000 UNIT/2ML IM SUSY
2.4000 10*6.[IU] | PREFILLED_SYRINGE | Freq: Once | INTRAMUSCULAR | Status: AC
  Administered 2023-11-14: 2.4 10*6.[IU] via INTRAMUSCULAR

## 2023-11-20 ENCOUNTER — Other Ambulatory Visit (HOSPITAL_COMMUNITY): Payer: Self-pay

## 2023-11-20 ENCOUNTER — Other Ambulatory Visit: Payer: Self-pay

## 2023-11-20 NOTE — Progress Notes (Signed)
 Specialty Pharmacy Refill Coordination Note  Jay Smith is a 33 y.o. male assessed today regarding refills of clinic administered specialty medication(s) Cabotegravir  & Rilpivirine  (Cabenuva )   Clinic requested Courier to Provider Office   Delivery date: 12/04/23   Verified address: [address] [place] [address]   Medication will be filled on 12/01/23.

## 2023-12-04 ENCOUNTER — Telehealth: Payer: Self-pay

## 2023-12-04 NOTE — Telephone Encounter (Signed)
 [facility] Patient Advocate Encounter  Patient's medications CABENUVA have been couriered to [facility] from [facility] and will be administered at the patients appointment on 12/07/23.  [name], CPhT Specialty Pharmacy Patient [facility] for Infectious Disease Phone: [phone] Fax:  [phone]

## 2023-12-05 ENCOUNTER — Ambulatory Visit: Payer: Medicaid Other | Admitting: Pharmacist

## 2023-12-07 ENCOUNTER — Other Ambulatory Visit: Payer: Self-pay

## 2023-12-07 ENCOUNTER — Encounter: Payer: Self-pay | Admitting: Family

## 2023-12-07 ENCOUNTER — Ambulatory Visit (INDEPENDENT_AMBULATORY_CARE_PROVIDER_SITE_OTHER): Payer: Medicaid Other | Admitting: Family

## 2023-12-07 VITALS — BP 132/80 | HR 69 | Wt 169.6 lb

## 2023-12-07 DIAGNOSIS — Z0000 Encounter for general adult medical examination without abnormal findings: Secondary | ICD-10-CM

## 2023-12-07 DIAGNOSIS — B20 Human immunodeficiency virus [HIV] disease: Secondary | ICD-10-CM

## 2023-12-07 MED ORDER — CABOTEGRAVIR & RILPIVIRINE ER 400 & 600 MG/2ML IM SUER
1.0000 | Freq: Once | INTRAMUSCULAR | Status: AC
  Administered 2023-12-07: 1 via INTRAMUSCULAR

## 2023-12-07 NOTE — Patient Instructions (Addendum)
 Nice to see you.  We will check your lab work today.  Plan for follow up in 6 months or sooner if needed with lab work on the same day and with pharmacy provider in between   Have a great day and stay safe!

## 2023-12-07 NOTE — Progress Notes (Signed)
 Brief Narrative   Patient ID: Jay Smith, male    DOB: 1990/10/21, 33 y.o.   MRN: [id number]  [name] is a 33 y/o AA gentleman diagnosed with HIV in September 2019 with risk factor of MSM. Initial viral load was 24,700 and CD4 count of 690. Genotype with no significant medication resistance mutations. Entered care at [facility] Stage 1. No history of opportunistic infection. [id number] negative. Previous ART experience with Biktarvy, and Cabenuva. Switched from [place] with concern for increasing viral load.  Follow up [employer] remains with no significant medication resistant mutations and now back to [place] after Sym[place].   Subjective:    Chief Complaint  Patient presents with   Follow-up    Cab/     HPI:  Jay Smith is a 33 y.o. male with HIV disease last seen on 11/10/23 with with well-controlled virus and good adherence and tolerance to [place].  Previous lab work with viral load undetectable and CD4 count of 707.  STD testing on 11/10/2023 positive for syphilis and treated with 2,400,000 units of Bicillin once and negative for gonorrhea and chlamydia.  Here today for routine follow-up and next injection.  [name] has been doing well since his last office visit and continues to receive Cabenuva with no adverse side effects.  No new concerns/complaints.  Not currently working.  Back pain has improved with most recent injections.  Housing is stable with good access to food and transportation is via personal vehicle.  Due for routine dental care which she will schedule independently.  Healthcare maintenance reviewed.  Condoms and site-specific STD testing offered.  Denies fevers, chills, night sweats, headaches, changes in vision, neck pain/stiffness, nausea, diarrhea, vomiting, lesions or rashes.  Lab Results  Component Value Date   CD4TCELL 41 05/15/2023   CD4TABS 707 05/15/2023   Lab Results  Component Value Date   HIV1RNAQUANT Not Detected 05/15/2023      Allergies  Allergen Reactions   Dovato [Dolutegravir-Lamivudine] Other (See Comments)    Hypersensitivity reaction with diarrhea, skin peeling and headache.    Hydrocodone Swelling      Outpatient Medications Prior to Visit  Medication Sig Dispense Refill   acetaminophen (MAPAP) 500 MG tablet      cabotegravir & rilpivirine ER (CABENUVA) 400 & 600 MG/2ML injection Inject 1 kit into the muscle every 30 (thirty) days. 4 mL 11   lidocaine (LIDODERM) 5 % Place 1 patch onto the skin daily. Remove & Discard patch within 12 hours or as directed by MD 30 patch 0   ondansetron (ZOFRAN-ODT) 4 MG disintegrating tablet Take 1 tablet (4 mg total) by mouth every 4 (four) hours as needed for nausea or vomiting. 20 tablet 0   No facility-administered medications prior to visit.     Past Medical History:  Diagnosis Date   Asthma    Eczema    HIV infection (HCC)      Past Surgical History:  Procedure Laterality Date   COSMETIC SURGERY     Great Toe Surgery      Review of Systems  Constitutional:  Negative for appetite change, chills, fatigue, fever and unexpected weight change.  Eyes:  Negative for visual disturbance.  Respiratory:  Negative for cough, chest tightness, shortness of breath and wheezing.   Cardiovascular:  Negative for chest pain and leg swelling.  Gastrointestinal:  Negative for abdominal pain, constipation, diarrhea, nausea and vomiting.  Genitourinary:  Negative for dysuria, flank pain, frequency, genital sores, hematuria and urgency.  Skin:  Negative for rash.  Allergic/Immunologic: Negative for immunocompromised state.  Neurological:  Negative for dizziness and headaches.      Objective:    BP 132/80   Pulse 69   Wt 169 lb 9.6 oz (76.9 kg)   SpO2 100%   BMI 26.56 kg/m  Nursing note and vital signs reviewed.  Physical Exam Constitutional:      General: He is not in acute distress.    Appearance: He is well-developed.  Eyes:      Conjunctiva/sclera: Conjunctivae normal.  Cardiovascular:     Rate and Rhythm: Normal rate and regular rhythm.     Heart sounds: Normal heart sounds. No murmur heard.    No friction rub. No gallop.  Pulmonary:     Effort: Pulmonary effort is normal. No respiratory distress.     Breath sounds: Normal breath sounds. No wheezing or rales.  Chest:     Chest wall: No tenderness.  Abdominal:     General: Bowel sounds are normal.     Palpations: Abdomen is soft.     Tenderness: There is no abdominal tenderness.  Musculoskeletal:     Cervical back: Neck supple.  Lymphadenopathy:     Cervical: No cervical adenopathy.  Skin:    General: Skin is warm and dry.     Findings: No rash.  Neurological:     Mental Status: He is alert and oriented to person, place, and time.  Psychiatric:        Behavior: Behavior normal.        Thought Content: Thought content normal.        Judgment: Judgment normal.         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 Fish continues to have well-controlled virus with good adherence and tolerance to [place].  Reviewed previous lab work and discussed plan of care and U equals U.  Held a brief discussion regarding possibility of returning to every 2 month injections and at this time would like to stay on monthly injections.  Check blood work.  Social determinants of health reviewed  with no interventions indicated.  Cabenuva administered without complication.  Plan for follow-up in 6 months or sooner if needed with lab work on the same day and with pharmacy providers in between.      Relevant Orders   COMPLETE METABOLIC PANEL WITH GFR   HIV-1 RNA quant-no reflex-bld   T-helper cell (CD4)- ([facility] clinic only)   Healthcare maintenance   Discussed importance of safe sexual practice and condom use. Condoms and site specific STD testing offered.  Vaccinations reviewed and declined after counseling.  Due for routine dental care which he will schedule independently and can refer to [facility] if  needed.         I am having [facility] "[name]" maintain his acetaminophen, lidocaine, ondansetron, and Cabenuva. We administered cabotegravir & rilpivirine ER.   Meds ordered this encounter  Medications   cabotegravir & rilpivirine ER (CABENUVA) 400 & 600 MG/2ML injection 1 kit     Follow-up: Return in about 6 months (around 06/05/2024). or sooner if needed.    Marcos Eke, MSN, FNP-C Nurse Practitioner [facility] for Infectious Disease [facility] Main number: [phone]

## 2023-12-07 NOTE — Assessment & Plan Note (Signed)
 Jay Smith continues to have well-controlled virus with good adherence and tolerance to [place].  Reviewed previous lab work and discussed plan of care and U equals U.  Held a brief discussion regarding possibility of returning to every 2 month injections and at this time would like to stay on monthly injections.  Check blood work.  Social determinants of health reviewed with no interventions indicated.  Cabenuva administered without complication.  Plan for follow-up in 6 months or sooner if needed with lab work on the same day and with pharmacy providers in between.

## 2023-12-07 NOTE — Assessment & Plan Note (Signed)
 Discussed importance of safe sexual practice and condom use. Condoms and site specific STD testing offered.  Vaccinations reviewed and declined after counseling.  Due for routine dental care which he will schedule independently and can refer to [facility] if needed.

## 2023-12-08 LAB — T-HELPER CELL (CD4) - (RCID CLINIC ONLY)
CD4 % Helper T Cell: 37 % (ref 33–65)
CD4 T Cell Abs: 930 /uL (ref 400–1790)

## 2023-12-10 LAB — COMPLETE METABOLIC PANEL WITH GFR
AG Ratio: 1.5 (calc) (ref 1.0–2.5)
ALT: 17 U/L (ref 9–46)
AST: 15 U/L (ref 10–40)
Albumin: 4.5 g/dL (ref 3.6–5.1)
Alkaline phosphatase (APISO): 62 U/L (ref 36–130)
BUN: 17 mg/dL (ref 7–25)
CO2: 25 mmol/L (ref 20–32)
Calcium: 9.5 mg/dL (ref 8.6–10.3)
Chloride: 106 mmol/L (ref 98–110)
Creat: 1.08 mg/dL (ref 0.60–1.26)
Globulin: 3.1 g/dL (ref 1.9–3.7)
Glucose, Bld: 98 mg/dL (ref 65–99)
Potassium: 4.2 mmol/L (ref 3.5–5.3)
Sodium: 137 mmol/L (ref 135–146)
Total Bilirubin: 0.5 mg/dL (ref 0.2–1.2)
Total Protein: 7.6 g/dL (ref 6.1–8.1)
eGFR: 93 mL/min/{1.73_m2} (ref >=60)

## 2023-12-10 LAB — HIV-1 RNA QUANT-NO REFLEX-BLD
HIV 1 RNA Quant: NOT DETECTED {copies}/mL
HIV-1 RNA Quant, Log: NOT DETECTED {Log_copies}/mL

## 2023-12-18 ENCOUNTER — Other Ambulatory Visit: Payer: Self-pay

## 2023-12-18 ENCOUNTER — Other Ambulatory Visit (HOSPITAL_COMMUNITY): Payer: Self-pay

## 2023-12-18 NOTE — Progress Notes (Signed)
 Specialty Pharmacy Refill Coordination Note  Jay Smith is a 33 y.o. male assessed today regarding refills of clinic administered specialty medication(s) Cabotegravir & Rilpivirine [name])   Clinic requested Courier to Provider Office   Delivery date: 01/01/24   Verified address: [address] [place] [address]   Medication will be filled on 01/02/24.

## 2024-01-01 ENCOUNTER — Ambulatory Visit: Payer: Medicaid Other | Admitting: Pharmacist

## 2024-01-01 ENCOUNTER — Telehealth: Payer: Self-pay

## 2024-01-01 NOTE — Progress Notes (Unsigned)
 HPI: Jay Smith is a 33 y.o. male who presents to the [facility] pharmacy clinic for [place] administration.  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   LIDOCAINE (LIDODERM) 5 %    Place 1 patch onto the skin daily. Remove & Discard patch within 12 hours or as directed by MD   ONDANSETRON (ZOFRAN-ODT) 4 MG DISINTEGRATING TABLET    Take 1 tablet (4 mg total) by mouth every 4 (four) hours as needed for nausea or vomiting.  Modified Medications   No medications on file  Discontinued Medications   No medications on file    Allergies: Allergies  Allergen Reactions   Dovato [Dolutegravir-Lamivudine] Other (See Comments)    Hypersensitivity reaction with diarrhea, skin peeling and headache.    Hydrocodone Swelling    Past Medical History: Past Medical History:  Diagnosis Date   Asthma    Eczema    HIV infection (HCC)     Social History: Social History   Socioeconomic History   Marital status: Single    Spouse name: Not on file   Number of children: Not on file   Years of education: Not on file   Highest education level: Not on file  Occupational History   Occupation: Heritage manager  Tobacco Use   Smoking status: Never   Smokeless tobacco: Never  Vaping Use   Vaping status: Never Used  Substance and Sexual Activity   Alcohol use: Yes    Comment: rare   Drug use: Yes    Types: Marijuana    Comment: socially   Sexual activity: Not Currently    Partners: Male    Birth control/protection: Condom    Comment:  declined condoms  Other Topics Concern   Not on file  Social History Narrative   Not on file   Social Drivers of Health   Financial Resource Strain: Not on file  Food Insecurity: Not on file  Transportation Needs: Not on file  Physical Activity: Not on file  Stress: Not on file  Social Connections: Unknown (04/28/2023)   Received from [facility]   Social Network    Social Network: Not on file    Labs: Lab Results  Component Value Date   HIV1RNAQUANT Not Detected 12/07/2023   HIV1RNAQUANT Not Detected 05/15/2023   HIV1RNAQUANT Not Detected 01/03/2023   CD4TABS 930 12/07/2023   CD4TABS 707 05/15/2023   CD4TABS 1,111 08/11/2022    RPR and STI Lab Results  Component Value Date   LABRPR REACTIVE (A) 11/10/2023   LABRPR REACTIVE (A) 05/15/2023   LABRPR NON-REACTIVE 10/13/2022   LABRPR REACTIVE (A) 02/23/2022   LABRPR REACTIVE (A) 08/16/2021   RPRTITER 1:4 (H) 11/10/2023   RPRTITER 1:1 (H) 05/15/2023   RPRTITER 1:1 (H) 02/23/2022   RPRTITER 1:1 (H) 08/16/2021   RPRTITER 1:1 (H) 12/07/2020    STI Results GC CT  06/06/2023 10:19 AM Negative    Negative    Negative  Negative    Negative    Negative  05/15/2023 11:33 AM Negative  Negative   10/13/2022  3:35 PM Negative  Negative   05/21/2021  8:59 AM Negative  Negative   12/07/2020  4:19 PM Negative  Negative   06/01/2018 12:00 AM Negative  Negative   02/12/2015 12:00 AM Negative  Negative     Hepatitis B Lab Results  Component Value Date   HEPBSAB BORDERLINE (A) 06/01/2018   HEPBSAG NON-REACTIVE 06/01/2018   HEPBCAB NON-REACTIVE 06/01/2018   Hepatitis C Lab Results  Component Value Date   HEPCAB NON-REACTIVE 06/01/2018   Hepatitis A Lab Results  Component Value Date   HAV NON-REACTIVE 06/01/2018   Lipids: Lab Results  Component Value Date   CHOL 193 04/18/2019   TRIG 234 (H) 04/18/2019   HDL 31 (L) 04/18/2019   CHOLHDL 6.2 (H) 04/18/2019   LDLCALC 124 (H) 04/18/2019    TARGET DATE:  The 31st  of the month  Assessment: Jay Smith presents today for their maintenance Cabenuva injections. Initial/past injections were tolerated well without issues. No problems with systemic effects of injections.   Administered cabotegravir 600mg /7mL in left upper outer quadrant of the gluteal muscle. Administered rilpivirine 900 mg/51mL in the right upper outer quadrant of the gluteal muscle. Monitored patient for 10 minutes after injection. Injections were tolerated well without issue. Patient will follow up in 2 months for next injection. Will defer HIV RNA as it was recently assessed in February.  Continues to decline vaccines; eligible for Menveo, PCV20, COVID, flu, HAV, and HPV vaccines.   Plan: - Cabenuva injections administered - Next injections scheduled for *** - Call with any issues or questions  Margarite Gouge, PharmD, CPP, BCIDP, AAHIVP Clinical Pharmacist Practitioner Infectious Diseases Clinical Pharmacist [facility]

## 2024-01-01 NOTE — Telephone Encounter (Signed)
 [facility] Patient Advocate Encounter  Patient's medications Cabenuva (monthly) have been couriered to [facility] from [employer] [employer] [facility] and will be administered at the patients appointment on 01/04/24.  Clearance Coots, CPhT Specialty Pharmacy Patient [facility] for Infectious Disease Phone: [phone] Fax:  [phone]

## 2024-01-04 ENCOUNTER — Other Ambulatory Visit: Payer: Self-pay

## 2024-01-04 ENCOUNTER — Ambulatory Visit: Payer: Medicaid Other | Admitting: Pharmacist

## 2024-01-04 DIAGNOSIS — B20 Human immunodeficiency virus [HIV] disease: Secondary | ICD-10-CM | POA: Diagnosis present

## 2024-01-04 MED ORDER — CABOTEGRAVIR & RILPIVIRINE ER 400 & 600 MG/2ML IM SUER
1.0000 | Freq: Once | INTRAMUSCULAR | Status: AC
Start: 2024-01-04 — End: 2024-01-04
  Administered 2024-01-04: 1 via INTRAMUSCULAR

## 2024-01-17 ENCOUNTER — Other Ambulatory Visit: Payer: Self-pay

## 2024-01-17 ENCOUNTER — Other Ambulatory Visit (HOSPITAL_COMMUNITY): Payer: Self-pay

## 2024-01-17 NOTE — Progress Notes (Signed)
 Specialty Pharmacy Refill Coordination Note  Jay Smith is a 33 y.o. male assessed today regarding refills of clinic administered specialty medication(s) Cabotegravir & Rilpivirine [name])   Clinic requested Courier to Provider Office   Delivery date: 01/29/24   Verified address: [address] [place] [address]   Medication will be filled on 01/26/24.

## 2024-01-26 ENCOUNTER — Other Ambulatory Visit: Payer: Self-pay

## 2024-01-29 ENCOUNTER — Telehealth: Payer: Self-pay

## 2024-01-29 NOTE — Telephone Encounter (Signed)
 [facility] Patient Advocate Encounter  Patient's medications CABENUVA  have been couriered to [facility] from [facility] and will be administered at the patients appointment on 02/01/24.  Roylene Corn, CPhT Specialty Pharmacy Patient [facility] for Infectious Disease Phone: [phone] Fax:  [phone]
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
## 2024-02-01 ENCOUNTER — Other Ambulatory Visit: Payer: Self-pay

## 2024-02-01 ENCOUNTER — Ambulatory Visit (INDEPENDENT_AMBULATORY_CARE_PROVIDER_SITE_OTHER): Payer: Self-pay | Admitting: Pharmacist

## 2024-02-01 DIAGNOSIS — B20 Human immunodeficiency virus [HIV] disease: Secondary | ICD-10-CM | POA: Diagnosis present

## 2024-02-01 MED ORDER — CABOTEGRAVIR & RILPIVIRINE ER 400 & 600 MG/2ML IM SUER
1.0000 | Freq: Once | INTRAMUSCULAR | Status: AC
  Administered 2024-02-01: 1 via INTRAMUSCULAR

## 2024-02-12 ENCOUNTER — Other Ambulatory Visit: Payer: Self-pay

## 2024-02-12 ENCOUNTER — Other Ambulatory Visit (HOSPITAL_COMMUNITY): Payer: Self-pay

## 2024-02-12 NOTE — Progress Notes (Signed)
 Specialty Pharmacy Refill Coordination Note  Jay Smith is a 33 y.o. male assessed today regarding refills of clinic administered specialty medication(s) Cabotegravir  & Rilpivirine  (Cabenuva )   Clinic requested Courier to Provider Office   Delivery date: 03/01/24   Verified address: [address] [place] [address]   Medication will be filled on 02/29/24.

## 2024-03-01 ENCOUNTER — Telehealth: Payer: Self-pay

## 2024-03-01 NOTE — Telephone Encounter (Signed)
 [facility] Patient Advocate Encounter  Patient's medications CABENUVA  have been couriered to [facility] from [facility] and will be administered at the patients appointment on 03/05/24.  Verline Glow, CPhT Specialty Pharmacy Patient [facility] Phone: [phone] Fax:  [phone]

## 2024-03-03 ENCOUNTER — Other Ambulatory Visit: Payer: Self-pay

## 2024-03-03 ENCOUNTER — Encounter (HOSPITAL_BASED_OUTPATIENT_CLINIC_OR_DEPARTMENT_OTHER): Payer: Self-pay

## 2024-03-03 ENCOUNTER — Emergency Department (HOSPITAL_BASED_OUTPATIENT_CLINIC_OR_DEPARTMENT_OTHER)
Admission: EM | Admit: 2024-03-03 | Discharge: 2024-03-03 | Disposition: A | Discharge status: Home / Self Care | Attending: Emergency Medicine | Admitting: Emergency Medicine

## 2024-03-03 DIAGNOSIS — Z21 Asymptomatic human immunodeficiency virus [HIV] infection status: Secondary | ICD-10-CM | POA: Diagnosis not present

## 2024-03-03 DIAGNOSIS — G43809 Other migraine, not intractable, without status migrainosus: Secondary | ICD-10-CM | POA: Diagnosis not present

## 2024-03-03 DIAGNOSIS — G43909 Migraine, unspecified, not intractable, without status migrainosus: Secondary | ICD-10-CM | POA: Diagnosis present

## 2024-03-03 DIAGNOSIS — J45909 Unspecified asthma, uncomplicated: Secondary | ICD-10-CM | POA: Diagnosis not present

## 2024-03-03 MED ORDER — KETOROLAC TROMETHAMINE 30 MG/ML IJ SOLN
30.0000 mg | Freq: Once | INTRAMUSCULAR | Status: AC
  Administered 2024-03-03: 30 mg via INTRAVENOUS
  Filled 2024-03-03: qty 1

## 2024-03-03 MED ORDER — PROCHLORPERAZINE EDISYLATE 10 MG/2ML IJ SOLN
10.0000 mg | Freq: Once | INTRAMUSCULAR | Status: AC
  Administered 2024-03-03: 10 mg via INTRAVENOUS
  Filled 2024-03-03: qty 2

## 2024-03-03 MED ORDER — DIPHENHYDRAMINE HCL 50 MG/ML IJ SOLN
25.0000 mg | Freq: Once | INTRAMUSCULAR | Status: AC
  Administered 2024-03-03: 25 mg via INTRAVENOUS
  Filled 2024-03-03: qty 1

## 2024-03-03 NOTE — ED Provider Notes (Signed)
 [place] EMERGENCY DEPARTMENT AT [facility] Provider Note   CSN: [id number] Arrival date & time: 03/03/24  0409     History  Chief Complaint  Patient presents with   Migraine    ALANDIS BLUEMEL is a 33 y.o. male.  HPI     This is a 33 year old male who presents with concern for headache.  Patient reports that he has had a migraine headache for the last 3 days.  He has a history of the same.  States that the headache is similar nature to his normal migraines just lasted longer.  He has taken Excedrin, ibuprofen , Tylenol  with minimal relief.  No blurry vision.  Denies nausea or vomiting.  Does report photosensitivity.  No recent fevers.  Home Medications Prior to Admission medications   Medication Sig Start Date End Date Taking? Authorizing Provider  acetaminophen  (MAPAP) 500 MG tablet     [provider]  cabotegravir  & rilpivirine  ER (CABENUVA ) 400 & 600 MG/2ML injection Inject 1 kit into the muscle every 30 (thirty) days. 08/28/23   Sonya Duster, RPH-CPP  lidocaine  (LIDODERM ) 5 % Place 1 patch onto the skin daily. Remove & Discard patch within 12 hours or as directed by MD 02/02/23   Arnie Bibber, MD  ondansetron  (ZOFRAN -ODT) 4 MG disintegrating tablet Take 1 tablet (4 mg total) by mouth every 4 (four) hours as needed for nausea or vomiting. 02/08/23   Wynetta Heckle, MD      Allergies    Dovato  [dolutegravir-lamivudine] and Hydrocodone    Review of Systems   Review of Systems  Constitutional:  Negative for fever.  Eyes:  Positive for photophobia.  Respiratory:  Negative for shortness of breath.   Neurological:  Positive for headaches. Negative for dizziness.  All other systems reviewed and are negative.   Physical Exam Updated Vital Signs BP 101/79   Pulse 60   Temp 97.8 F (36.6 C) (Oral)   Resp 16   Ht 1.702 m (5\' 7" )   Wt 74.4 kg   SpO2 95%   BMI 25.69 kg/m  Physical Exam Vitals and nursing note reviewed.   Constitutional:      Appearance: He is well-developed. He is not ill-appearing.  HENT:     Head: Normocephalic and atraumatic.  Eyes:     Pupils: Pupils are equal, round, and reactive to light.  Cardiovascular:     Rate and Rhythm: Normal rate and regular rhythm.     Heart sounds: Normal heart sounds. No murmur heard. Pulmonary:     Effort: Pulmonary effort is normal. No respiratory distress.     Breath sounds: Normal breath sounds. No wheezing.  Abdominal:     Palpations: Abdomen is soft.  Musculoskeletal:     Cervical back: Neck supple.  Lymphadenopathy:     Cervical: No cervical adenopathy.  Skin:    General: Skin is warm and dry.  Neurological:     Mental Status: He is alert and oriented to person, place, and time.     Comments: Cranial nerves II through XII intact, 5 out of 5 strength in all 4 extremities, no dysmetria to finger-nose-finger  Psychiatric:        Mood and Affect: Mood normal.     ED Results / Procedures / Treatments   Labs (all labs ordered are listed, but only abnormal results are displayed) Labs Reviewed - No data to display  EKG None  Radiology No results found.  Procedures Procedures    Medications Ordered  in ED Medications  prochlorperazine  (COMPAZINE ) injection 10 mg (10 mg Intravenous Given 03/03/24 0446)  diphenhydrAMINE  (BENADRYL ) injection 25 mg (25 mg Intravenous Given 03/03/24 0448)  ketorolac  (TORADOL ) 30 MG/ML injection 30 mg (30 mg Intravenous Given 03/03/24 0446)    ED Course/ Medical Decision Making/ A&P Clinical Course as of 03/03/24 0537  Sun Mar 03, 2024  0537 Patient states he feels much better. [CH]    Clinical Course User Index [CH] Katalina Magri, Vonzella Guernsey, MD                                 Medical Decision Making Risk Prescription drug management.   This patient presents to the ED for concern of headache, this involves an extensive number of treatment options, and is a complaint that carries with it a high risk of  complications and morbidity.  I considered the following differential and admission for this acute, potentially life threatening condition.  The differential diagnosis includes tension headache, migraine, less likely meningitis, subarachnoid hemorrhage  MDM:    This is a 33 year old male who presents with headache.  Reports that this is consistent with his prior migraines.  Took medications at home with mild relief.  He is nontoxic and vital signs reassuring.  No red flags.  Neurologically intact.  Patient was given a migraine cocktail with complete resolution of the symptoms.  Do not feel he needs advanced imaging at this time as I have low suspicion for subarachnoid hemorrhage.  (Labs, imaging, consults)  Labs: I Ordered, and personally interpreted labs.  The pertinent results include: None  Imaging Studies ordered: I ordered imaging studies including none I independently visualized and interpreted imaging. I agree with the radiologist interpretation  Additional history obtained from chart review.  External records from outside source obtained and reviewed including prior evaluations  Cardiac Monitoring: The patient was maintained on a cardiac monitor.  If on the cardiac monitor, I personally viewed and interpreted the cardiac monitored which showed an underlying rhythm of: Sinus  Reevaluation: After the interventions noted above, I reevaluated the patient and found that they have :resolved  Social Determinants of Health:  lives independently  Disposition: Discharge  Co morbidities that complicate the patient evaluation  Past Medical History:  Diagnosis Date   Asthma    Eczema    HIV infection (HCC)      Medicines Meds ordered this encounter  Medications   prochlorperazine  (COMPAZINE ) injection 10 mg   diphenhydrAMINE  (BENADRYL ) injection 25 mg   ketorolac  (TORADOL ) 30 MG/ML injection 30 mg    I have reviewed the patients home medicines and have made adjustments as  needed  Problem List / ED Course: Problem List Items Addressed This Visit   None Visit Diagnoses       Other migraine without status migrainosus, not intractable    -  Primary   Relevant Medications   ketorolac  (TORADOL ) 30 MG/ML injection 30 mg (Completed)                   Final Clinical Impression(s) / ED Diagnoses Final diagnoses:  Other migraine without status migrainosus, not intractable    Rx / DC Orders ED Discharge Orders     None         Rory Collard, MD 03/03/24 [phone]

## 2024-03-03 NOTE — ED Triage Notes (Addendum)
 Pt ambulatory to rm 12. C/o R sided nonradiating migraine Ha x3 days rating 9/10. Denie sN/V/ dizziness/blurred vision. Pt has hx of migraine headaches. Took OTC medication pta and throughout the 3 days migraine has been present with no relief.

## 2024-03-03 NOTE — Discharge Instructions (Signed)
 You were seen today for migraine headache.  You were treated.  Follow-up with your primary doctor.  Continue medications at home.

## 2024-03-03 NOTE — ED Notes (Signed)
 Pt out of ED with steady gait, not in visible distress with all belongings and paperwork

## 2024-03-05 ENCOUNTER — Encounter: Admitting: Pharmacist

## 2024-03-05 NOTE — Progress Notes (Deleted)
 HPI: Jay Smith is a 33 y.o. male who presents to the [facility] pharmacy clinic for Cabenuva  administration.  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   LIDOCAINE  (LIDODERM ) 5 %    Place 1 patch onto the skin daily. Remove & Discard patch within 12 hours or as directed by MD   ONDANSETRON  (ZOFRAN -ODT) 4 MG DISINTEGRATING TABLET    Take 1 tablet (4 mg total) by mouth every 4 (four) hours as needed for nausea or vomiting.  Modified Medications   No medications on file  Discontinued Medications   No medications on file    Allergies: Allergies  Allergen Reactions   Dovato  [Dolutegravir-Lamivudine] Other (See Comments)    Hypersensitivity reaction with diarrhea, skin peeling and headache.    Hydrocodone Swelling    Labs: Lab Results  Component Value Date   HIV1RNAQUANT Not Detected 12/07/2023   HIV1RNAQUANT Not Detected 05/15/2023   HIV1RNAQUANT Not Detected 01/03/2023   CD4TABS 930 12/07/2023   CD4TABS 707 05/15/2023   CD4TABS 1,111 08/11/2022    RPR and STI Lab Results  Component Value Date   LABRPR REACTIVE (A) 11/10/2023   LABRPR REACTIVE (A) 05/15/2023   LABRPR NON-REACTIVE 10/13/2022   LABRPR REACTIVE (A) 02/23/2022   LABRPR REACTIVE (A) 08/16/2021   RPRTITER 1:4 (H) 11/10/2023   RPRTITER 1:1 (H) 05/15/2023   RPRTITER 1:1 (H) 02/23/2022   RPRTITER 1:1 (H) 08/16/2021   RPRTITER 1:1 (H) 12/07/2020    STI Results GC CT  06/06/2023 10:19 AM Negative    Negative    Negative  Negative    Negative     Negative   05/15/2023 11:33 AM Negative  Negative   10/13/2022  3:35 PM Negative  Negative   05/21/2021  8:59 AM Negative  Negative   12/07/2020  4:19 PM Negative  Negative   06/01/2018 12:00 AM Negative  Negative   02/12/2015 12:00 AM Negative  Negative     Hepatitis B Lab Results  Component Value Date   HEPBSAB BORDERLINE (A) 06/01/2018   HEPBSAG NON-REACTIVE 06/01/2018   HEPBCAB NON-REACTIVE 06/01/2018   Hepatitis C Lab Results  Component Value Date   HEPCAB NON-REACTIVE 06/01/2018   Hepatitis A Lab Results  Component Value Date   HAV NON-REACTIVE 06/01/2018   Lipids: Lab Results  Component Value Date   CHOL 193 04/18/2019   TRIG 234 (H) 04/18/2019   HDL 31 (L) 04/18/2019   CHOLHDL 6.2 (H) 04/18/2019   LDLCALC 124 (H) 04/18/2019    TARGET DATE: The 31st  Assessment: Jay Smith presents today for his maintenance Cabenuva  injections. Past injections were tolerated well without issues. Last HIV RNA was undetectable and CD4 count was 930 in February. Doing well with no issues today.  Administered cabotegravir  600mg /93mL in left upper outer quadrant of the gluteal muscle. Administered rilpivirine  900 mg/3mL in the right upper outer quadrant of the gluteal muscle. No issues with injections. He will follow up in 2 months for next set of injections.  Eligible for Prevnar, Menveo,  Shingles, HPV, and Hepatitis A vaccinations. Declines *** immunizations today.   STI screening? Q15mo Cab - can schedule out 2nd visit  Plan: - Cabenuva  injections administered - Next injections scheduled for 04/02/24 with [name] - Call with any issues or questions  Georga Killings, PharmD PGY-1 Pharmacy Resident

## 2024-03-06 NOTE — Progress Notes (Unsigned)
 HPI: Jay Smith is a 33 y.o. male who presents to the [facility] pharmacy clinic for Cabenuva  administration.  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   LIDOCAINE  (LIDODERM ) 5 %    Place 1 patch onto the skin daily. Remove & Discard patch within 12 hours or as directed by MD   ONDANSETRON  (ZOFRAN -ODT) 4 MG DISINTEGRATING TABLET    Take 1 tablet (4 mg total) by mouth every 4 (four) hours as needed for nausea or vomiting.  Modified Medications   No medications on file  Discontinued Medications   No medications on file    Allergies: Allergies  Allergen Reactions   Dovato  [Dolutegravir-Lamivudine] Other (See Comments)    Hypersensitivity reaction with diarrhea, skin peeling and headache.    Hydrocodone Swelling    Labs: Lab Results  Component Value Date   HIV1RNAQUANT Not Detected 12/07/2023   HIV1RNAQUANT Not Detected 05/15/2023   HIV1RNAQUANT Not Detected 01/03/2023   CD4TABS 930 12/07/2023   CD4TABS 707 05/15/2023   CD4TABS 1,111 08/11/2022    RPR and STI Lab Results  Component Value Date   LABRPR REACTIVE (A) 11/10/2023   LABRPR REACTIVE (A) 05/15/2023   LABRPR NON-REACTIVE 10/13/2022   LABRPR REACTIVE (A) 02/23/2022   LABRPR REACTIVE (A) 08/16/2021   RPRTITER 1:4 (H) 11/10/2023   RPRTITER 1:1 (H) 05/15/2023   RPRTITER 1:1 (H) 02/23/2022   RPRTITER 1:1 (H) 08/16/2021   RPRTITER 1:1 (H) 12/07/2020    STI Results GC CT  06/06/2023 10:19 AM Negative    Negative    Negative  Negative    Negative     Negative   05/15/2023 11:33 AM Negative  Negative   10/13/2022  3:35 PM Negative  Negative   05/21/2021  8:59 AM Negative  Negative   12/07/2020  4:19 PM Negative  Negative   06/01/2018 12:00 AM Negative  Negative   02/12/2015 12:00 AM Negative  Negative     Hepatitis B Lab Results  Component Value Date   HEPBSAB BORDERLINE (A) 06/01/2018   HEPBSAG NON-REACTIVE 06/01/2018   HEPBCAB NON-REACTIVE 06/01/2018   Hepatitis C Lab Results  Component Value Date   HEPCAB NON-REACTIVE 06/01/2018   Hepatitis A Lab Results  Component Value Date   HAV NON-REACTIVE 06/01/2018   Lipids: Lab Results  Component Value Date   CHOL 193 04/18/2019   TRIG 234 (H) 04/18/2019   HDL 31 (L) 04/18/2019   CHOLHDL 6.2 (H) 04/18/2019   LDLCALC 124 (H) 04/18/2019    TARGET DATE: 31st of the month  Assessment: Jay Smith presents today for his maintenance Cabenuva  injections. Past injections were tolerated well without issues. Last HIV RNA was undetectable and CD4 count was 930 in February. Doing well with no issues today.  Administered cabotegravir  400mg /42mL in left upper outer quadrant of the gluteal muscle. Administered rilpivirine  600 mg/2mL in the right upper outer quadrant of the gluteal muscle. No issues with injections. He will follow up in 1 month for next set of injections.  Eligible for  Prevnar, Menveo, Shingles, HPV, and Hepatitis A vaccinations. Declines *** immunizations today.   STI screening? Q44mo Cab - can schedule out 2nd visit  Plan: - Cabenuva  injections administered - Next injections scheduled for 04/02/24 with [name] - Call with any issues or questions  Valarie Garner, PharmD PGY1 Pharmacy Resident

## 2024-03-07 ENCOUNTER — Encounter: Admitting: Pharmacist

## 2024-03-13 NOTE — Progress Notes (Unsigned)
 HPI: Jay Smith is a 33 y.o. male who presents to the [facility] pharmacy clinic for Cabenuva  administration.  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   LIDOCAINE  (LIDODERM ) 5 %    Place 1 patch onto the skin daily. Remove & Discard patch within 12 hours or as directed by MD   ONDANSETRON  (ZOFRAN -ODT) 4 MG DISINTEGRATING TABLET    Take 1 tablet (4 mg total) by mouth every 4 (four) hours as needed for nausea or vomiting.  Modified Medications   No medications on file  Discontinued Medications   No medications on file    Allergies: Allergies  Allergen Reactions   Dovato  [Dolutegravir-Lamivudine] Other (See Comments)    Hypersensitivity reaction with diarrhea, skin peeling and headache.    Hydrocodone Swelling    Labs: Lab Results  Component Value Date   HIV1RNAQUANT Not Detected 12/07/2023   HIV1RNAQUANT Not Detected 05/15/2023   HIV1RNAQUANT Not Detected 01/03/2023   CD4TABS 930 12/07/2023   CD4TABS 707 05/15/2023   CD4TABS 1,111 08/11/2022    RPR and STI Lab Results  Component Value Date   LABRPR REACTIVE (A) 11/10/2023   LABRPR REACTIVE (A) 05/15/2023   LABRPR NON-REACTIVE 10/13/2022   LABRPR REACTIVE (A) 02/23/2022   LABRPR REACTIVE (A) 08/16/2021   RPRTITER 1:4 (H) 11/10/2023   RPRTITER 1:1 (H) 05/15/2023   RPRTITER 1:1 (H) 02/23/2022   RPRTITER 1:1 (H) 08/16/2021   RPRTITER 1:1 (H) 12/07/2020    STI Results GC CT  06/06/2023 10:19 AM Negative    Negative    Negative  Negative    Negative     Negative   05/15/2023 11:33 AM Negative  Negative   10/13/2022  3:35 PM Negative  Negative   05/21/2021  8:59 AM Negative  Negative   12/07/2020  4:19 PM Negative  Negative   06/01/2018 12:00 AM Negative  Negative   02/12/2015 12:00 AM Negative  Negative     Hepatitis B Lab Results  Component Value Date   HEPBSAB BORDERLINE (A) 06/01/2018   HEPBSAG NON-REACTIVE 06/01/2018   HEPBCAB NON-REACTIVE 06/01/2018   Hepatitis C Lab Results  Component Value Date   HEPCAB NON-REACTIVE 06/01/2018   Hepatitis A Lab Results  Component Value Date   HAV NON-REACTIVE 06/01/2018   Lipids: Lab Results  Component Value Date   CHOL 193 04/18/2019   TRIG 234 (H) 04/18/2019   HDL 31 (L) 04/18/2019   CHOLHDL 6.2 (H) 04/18/2019   LDLCALC 124 (H) 04/18/2019    TARGET DATE: The 31st of each month  Assessment: Jay Smith presents today for his maintenance Cabenuva  injections. Past injections were tolerated well without issues. Last HIV RNA was undetectable and CD4 count was 930 in February. Doing well with no issues today.  Administered cabotegravir  600mg /22mL in left upper outer quadrant of the gluteal muscle. Administered rilpivirine  900 mg/3mL in the right upper outer quadrant of the gluteal muscle. No issues with injections. He will follow up in 2 months for next set of injections.  Eligible  for Prevnar, Menveo, Shingles, HPV, and Hepatitis A vaccinations. Declines *** immunizations today.    Plan: - Cabenuva  injections administered - Next injections scheduled for *** - Call with any issues or questions  Tolu Abygayle Deltoro, PharmD [employer] PGY-1

## 2024-03-14 ENCOUNTER — Other Ambulatory Visit: Payer: Self-pay

## 2024-03-14 ENCOUNTER — Ambulatory Visit: Admitting: Pharmacist

## 2024-03-14 DIAGNOSIS — B20 Human immunodeficiency virus [HIV] disease: Secondary | ICD-10-CM

## 2024-03-14 MED ORDER — CABOTEGRAVIR & RILPIVIRINE ER 400 & 600 MG/2ML IM SUER
1.0000 | Freq: Once | INTRAMUSCULAR | Status: AC
  Administered 2024-03-14: 1 via INTRAMUSCULAR

## 2024-03-25 ENCOUNTER — Other Ambulatory Visit: Payer: Self-pay

## 2024-03-25 ENCOUNTER — Other Ambulatory Visit (HOSPITAL_COMMUNITY): Payer: Self-pay

## 2024-03-25 NOTE — Progress Notes (Signed)
 Specialty Pharmacy Refill Coordination Note  CALEM COCOZZA is a 33 y.o. male assessed today regarding refills of clinic administered specialty medication(s) Cabotegravir  & Rilpivirine  (Cabenuva )   Clinic requested Courier to Provider Office   Delivery date: 03/28/24   Verified address: [address] [place] [address]   Medication will be filled on 03/27/24.

## 2024-03-28 ENCOUNTER — Telehealth: Payer: Self-pay

## 2024-03-28 NOTE — Progress Notes (Unsigned)
 HPI: Jay Smith is a 33 y.o. male who presents to the [facility] pharmacy clinic for Cabenuva  administration.  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   LIDOCAINE  (LIDODERM ) 5 %    Place 1 patch onto the skin daily. Remove & Discard patch within 12 hours or as directed by MD   ONDANSETRON  (ZOFRAN -ODT) 4 MG DISINTEGRATING TABLET    Take 1 tablet (4 mg total) by mouth every 4 (four) hours as needed for nausea or vomiting.  Modified Medications   No medications on file  Discontinued Medications   No medications on file    Allergies: Allergies  Allergen Reactions   Dovato  [Dolutegravir-Lamivudine] Other (See Comments)    Hypersensitivity reaction with diarrhea, skin peeling and headache.    Hydrocodone Swelling    Past Medical History: Past Medical History:  Diagnosis Date   Asthma    Eczema    HIV infection (HCC)     Social History: Social History   Socioeconomic History   Marital status: Single    Spouse name: Not on file   Number of children: Not on file   Years of education: Not on file   Highest education level: Not on file  Occupational History   Occupation: Heritage manager  Tobacco Use   Smoking status: Never   Smokeless tobacco: Never  Vaping Use   Vaping status: Never Used  Substance and Sexual Activity   Alcohol use: Yes    Comment: rare   Drug use: Yes    Types: Marijuana    Comment: socially   Sexual activity: Not Currently    Partners: Male    Birth control/protection: Condom    Comment:  declined condoms  Other Topics Concern   Not on file  Social History Narrative   Not on file   Social Drivers of Health   Financial Resource Strain: Not on file  Food Insecurity: Not on file  Transportation Needs: Not on file  Physical Activity: Not on file  Stress: Not on file  Social Connections: Unknown (04/28/2023)   Received from [facility]   Social Network    Social Network: Not on file    Labs: Lab Results  Component Value Date   HIV1RNAQUANT Not Detected 12/07/2023   HIV1RNAQUANT Not Detected 05/15/2023   HIV1RNAQUANT Not Detected 01/03/2023   CD4TABS 930 12/07/2023   CD4TABS 707 05/15/2023   CD4TABS 1,111 08/11/2022    RPR and STI Lab Results  Component Value Date   LABRPR REACTIVE (A) 11/10/2023   LABRPR REACTIVE (A) 05/15/2023   LABRPR NON-REACTIVE 10/13/2022   LABRPR REACTIVE (A) 02/23/2022   LABRPR REACTIVE (A) 08/16/2021   RPRTITER 1:4 (H) 11/10/2023   RPRTITER 1:1 (H) 05/15/2023   RPRTITER 1:1 (H) 02/23/2022   RPRTITER 1:1 (H) 08/16/2021   RPRTITER 1:1 (H) 12/07/2020    STI Results GC CT  06/06/2023 10:19 AM Negative    Negative    Negative  Negative    Negative    Negative  05/15/2023 11:33 AM Negative  Negative   10/13/2022  3:35 PM Negative  Negative   05/21/2021  8:59 AM Negative  Negative   12/07/2020  4:19 PM Negative  Negative   06/01/2018 12:00 AM Negative  Negative   02/12/2015 12:00 AM Negative  Negative     Hepatitis B Lab Results  Component Value Date   HEPBSAB BORDERLINE (A) 06/01/2018   HEPBSAG NON-REACTIVE 06/01/2018   HEPBCAB NON-REACTIVE 06/01/2018   Hepatitis C Lab Results  Component Value Date   HEPCAB NON-REACTIVE 06/01/2018   Hepatitis A Lab Results  Component Value Date   HAV NON-REACTIVE 06/01/2018   Lipids: Lab Results  Component Value Date   CHOL 193 04/18/2019   TRIG 234 (H) 04/18/2019   HDL 31 (L) 04/18/2019   CHOLHDL 6.2 (H) 04/18/2019   LDLCALC 124 (H) 04/18/2019    TARGET DATE:  The 31st  of the month  Assessment: Jay Smith presents today for their maintenance Cabenuva  injections. Initial/past injections were tolerated well without issues. No problems with systemic effects of injections.   Administered cabotegravir  400mg /10mL in left upper outer quadrant of the gluteal muscle. Administered rilpivirine  600 mg/2mL in the right upper outer quadrant of the gluteal muscle. Monitored patient for 10 minutes after injection. Injections were tolerated well without issue. Patient will follow up in 2 months for next injection.  Remains eligible for multiple vaccines including PCV20, Menveo, HAV, and HPV; he continues to decline all vaccines.   Plan: - Cabenuva  injections administered - Next injections scheduled for 7/24 with me and 8/29 with Erla Haw  - Call with any issues or questions  Nicklas Barns, PharmD, CPP, BCIDP, AAHIVP Clinical Pharmacist Practitioner Infectious Diseases Clinical Pharmacist [facility]

## 2024-03-28 NOTE — Telephone Encounter (Signed)
 [facility] Patient Advocate Encounter  Patient's medications CABENUVA  have been couriered to [facility] from [facility] and will be administered at the patients appointment on 04/02/24.  Verline Glow, CPhT Specialty Pharmacy Patient [facility] for Infectious Disease Phone: [phone] Fax:  [phone]

## 2024-04-02 ENCOUNTER — Ambulatory Visit (INDEPENDENT_AMBULATORY_CARE_PROVIDER_SITE_OTHER): Payer: Self-pay | Admitting: Pharmacist

## 2024-04-02 ENCOUNTER — Other Ambulatory Visit: Payer: Self-pay

## 2024-04-02 DIAGNOSIS — B20 Human immunodeficiency virus [HIV] disease: Secondary | ICD-10-CM | POA: Diagnosis present

## 2024-04-02 MED ORDER — CABOTEGRAVIR & RILPIVIRINE ER 400 & 600 MG/2ML IM SUER
1.0000 | Freq: Once | INTRAMUSCULAR | Status: AC
  Administered 2024-04-02: 1 via INTRAMUSCULAR

## 2024-04-22 ENCOUNTER — Other Ambulatory Visit (HOSPITAL_COMMUNITY): Payer: Self-pay

## 2024-04-22 ENCOUNTER — Other Ambulatory Visit: Payer: Self-pay

## 2024-04-22 NOTE — Progress Notes (Signed)
 Specialty Pharmacy Refill Coordination Note  Jay Smith is a 33 y.o. male assessed today regarding refills of clinic administered specialty medication(s) Cabotegravir  & Rilpivirine  (Cabenuva )   Clinic requested Courier to Provider Office   Delivery date: 04/25/24   Verified address: [address] [place] [address]   Medication will be filled on 04/24/24.

## 2024-04-24 ENCOUNTER — Other Ambulatory Visit: Payer: Self-pay

## 2024-04-25 ENCOUNTER — Telehealth: Payer: Self-pay

## 2024-04-25 NOTE — Telephone Encounter (Signed)
 [facility] Patient Advocate Encounter  Patient's medications CABENUVA  have been couriered to [facility] from [facility] and will be administered at the patients appointment on 05/02/24.  Charmaine Sharps, CPhT Specialty Pharmacy Patient [facility] for Infectious Disease Phone: [phone] Fax:  [phone]

## 2024-04-30 NOTE — Progress Notes (Unsigned)
 HPI: Jay Smith is a 33 y.o. male who presents to the [facility] pharmacy clinic for Cabenuva  administration.  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   LIDOCAINE  (LIDODERM ) 5 %    Place 1 patch onto the skin daily. Remove & Discard patch within 12 hours or as directed by MD   ONDANSETRON  (ZOFRAN -ODT) 4 MG DISINTEGRATING TABLET    Take 1 tablet (4 mg total) by mouth every 4 (four) hours as needed for nausea or vomiting.  Modified Medications   No medications on file  Discontinued Medications   No medications on file    Allergies: Allergies  Allergen Reactions   Dovato  [Dolutegravir-Lamivudine] Other (See Comments)    Hypersensitivity reaction with diarrhea, skin peeling and headache.    Hydrocodone Swelling    Past Medical History: Past Medical History:  Diagnosis Date   Asthma    Eczema    HIV infection (HCC)     Social History: Social History   Socioeconomic History   Marital status: Single    Spouse name: Not on file   Number of children: Not on file   Years of education: Not on file   Highest education level: Not on file  Occupational History   Occupation: Heritage manager  Tobacco Use   Smoking status: Never   Smokeless tobacco: Never  Vaping Use   Vaping status: Never Used  Substance and Sexual Activity   Alcohol use: Yes    Comment: rare   Drug use: Yes    Types: Marijuana    Comment: socially   Sexual activity: Not Currently    Partners: Male    Birth control/protection: Condom    Comment:  declined condoms  Other Topics Concern   Not on file  Social History Narrative   Not on file   Social Drivers of Health   Financial Resource Strain: Not on file  Food Insecurity: Not on file  Transportation Needs: Not on file  Physical Activity: Not on file  Stress: Not on file  Social Connections: Unknown (04/28/2023)   Received from [facility]   Social Network    Social Network: Not on file    Labs: Lab Results  Component Value Date   HIV1RNAQUANT Not Detected 12/07/2023   HIV1RNAQUANT Not Detected 05/15/2023   HIV1RNAQUANT Not Detected 01/03/2023   CD4TABS 930 12/07/2023   CD4TABS 707 05/15/2023   CD4TABS 1,111 08/11/2022    RPR and STI Lab Results  Component Value Date   LABRPR REACTIVE (A) 11/10/2023   LABRPR REACTIVE (A) 05/15/2023   LABRPR NON-REACTIVE 10/13/2022   LABRPR REACTIVE (A) 02/23/2022   LABRPR REACTIVE (A) 08/16/2021   RPRTITER 1:4 (H) 11/10/2023   RPRTITER 1:1 (H) 05/15/2023   RPRTITER 1:1 (H) 02/23/2022   RPRTITER 1:1 (H) 08/16/2021   RPRTITER 1:1 (H) 12/07/2020    STI Results GC CT  06/06/2023 10:19 AM Negative    Negative    Negative  Negative    Negative    Negative  05/15/2023 11:33 AM Negative  Negative   10/13/2022  3:35 PM Negative  Negative   05/21/2021  8:59 AM Negative  Negative   12/07/2020  4:19 PM Negative  Negative   06/01/2018 12:00 AM Negative  Negative   02/12/2015 12:00 AM Negative  Negative     Hepatitis B Lab Results  Component Value Date   HEPBSAB BORDERLINE (A) 06/01/2018   HEPBSAG NON-REACTIVE 06/01/2018   HEPBCAB NON-REACTIVE 06/01/2018   Hepatitis C Lab Results  Component Value Date   HEPCAB NON-REACTIVE 06/01/2018   Hepatitis A Lab Results  Component Value Date   HAV NON-REACTIVE 06/01/2018   Lipids: Lab Results  Component Value Date   CHOL 193 04/18/2019   TRIG 234 (H) 04/18/2019   HDL 31 (L) 04/18/2019   CHOLHDL 6.2 (H) 04/18/2019   LDLCALC 124 (H) 04/18/2019    TARGET DATE:  The 31st  of the month  Assessment: Jay Smith presents today for their maintenance Cabenuva  injections. Initial/past injections were tolerated well without issues. No problems with systemic effects of injections.   Administered cabotegravir  600mg /69mL in left upper outer quadrant of the gluteal muscle. Administered rilpivirine  900 mg/3mL in the right upper outer quadrant of the gluteal muscle. Monitored patient for 10 minutes after injection. Injections were tolerated well without issue. Patient will follow up in 2 months for next injection. Will defer HIV RNA testing until next visit with Jay Smith; due for routine lipid panel at this visit as well.  Plan: - Cabenuva  injections administered - Next injections scheduled for 8/29 with Jay Smith and *** with me  - Call with any issues or questions  Alan Geralds, PharmD, CPP, BCIDP, AAHIVP Clinical Pharmacist Practitioner Infectious Diseases Clinical Pharmacist [facility]

## 2024-05-02 ENCOUNTER — Ambulatory Visit: Admitting: Pharmacist

## 2024-05-02 ENCOUNTER — Other Ambulatory Visit: Payer: Self-pay

## 2024-05-02 DIAGNOSIS — B20 Human immunodeficiency virus [HIV] disease: Secondary | ICD-10-CM

## 2024-05-02 MED ORDER — CABOTEGRAVIR & RILPIVIRINE ER 400 & 600 MG/2ML IM SUER
1.0000 | Freq: Once | INTRAMUSCULAR | Status: AC
  Administered 2024-05-02: 1 via INTRAMUSCULAR

## 2024-06-03 ENCOUNTER — Other Ambulatory Visit (HOSPITAL_COMMUNITY): Payer: Self-pay

## 2024-06-03 ENCOUNTER — Other Ambulatory Visit: Payer: Self-pay

## 2024-06-03 NOTE — Progress Notes (Signed)
 Specialty Pharmacy Refill Coordination Note  JLON BETKER is a 33 y.o. male assessed today regarding refills of clinic administered specialty medication(s) Cabotegravir  & Rilpivirine  (Cabenuva )   Clinic requested Courier to Provider Office   Delivery date: 06/05/24   Verified address: [address] [place] [address]   Medication will be filled on 06/04/24.

## 2024-06-04 ENCOUNTER — Other Ambulatory Visit: Payer: Self-pay

## 2024-06-05 ENCOUNTER — Emergency Department (HOSPITAL_BASED_OUTPATIENT_CLINIC_OR_DEPARTMENT_OTHER)
Admission: EM | Admit: 2024-06-05 | Discharge: 2024-06-05 | Disposition: A | Discharge status: Home / Self Care | Attending: Emergency Medicine | Admitting: Emergency Medicine

## 2024-06-05 ENCOUNTER — Encounter (HOSPITAL_BASED_OUTPATIENT_CLINIC_OR_DEPARTMENT_OTHER): Payer: Self-pay

## 2024-06-05 ENCOUNTER — Telehealth: Payer: Self-pay

## 2024-06-05 ENCOUNTER — Other Ambulatory Visit: Payer: Self-pay

## 2024-06-05 DIAGNOSIS — R519 Headache, unspecified: Secondary | ICD-10-CM

## 2024-06-05 DIAGNOSIS — Z5321 Procedure and treatment not carried out due to patient leaving prior to being seen by health care provider: Secondary | ICD-10-CM | POA: Insufficient documentation

## 2024-06-05 DIAGNOSIS — G43909 Migraine, unspecified, not intractable, without status migrainosus: Secondary | ICD-10-CM | POA: Insufficient documentation

## 2024-06-05 LAB — BASIC METABOLIC PANEL WITH GFR
Anion gap: 15 (ref 5–15)
BUN: 10 mg/dL (ref 6–20)
CO2: 19 mmol/L — ABNORMAL LOW (ref 22–32)
Calcium: 10.1 mg/dL (ref 8.9–10.3)
Chloride: 103 mmol/L (ref 98–111)
Creatinine, Ser: 1.15 mg/dL (ref 0.61–1.24)
GFR, Estimated: >60 mL/min (ref >60)
Glucose, Bld: 99 mg/dL (ref 70–99)
Potassium: 4.5 mmol/L (ref 3.5–5.1)
Sodium: 138 mmol/L (ref 135–145)

## 2024-06-05 LAB — CBC
HCT: 40.3 % (ref 39.0–52.0)
Hemoglobin: 13.5 g/dL (ref 13.0–17.0)
MCH: 24.5 pg — ABNORMAL LOW (ref 26.0–34.0)
MCHC: 33.5 g/dL (ref 30.0–36.0)
MCV: 73.3 fL — ABNORMAL LOW (ref 80.0–100.0)
Platelets: 204 K/uL (ref 150–400)
RBC: 5.5 MIL/uL (ref 4.22–5.81)
RDW: 15.3 % (ref 11.5–15.5)
WBC: 6.5 K/uL (ref 4.0–10.5)
nRBC: 0 % (ref 0.0–0.2)

## 2024-06-05 NOTE — ED Notes (Signed)
 Pt left. This RN went to check in on patient and he reported he has been for over 4 hours and wanted to leave. This RN explain to patient that I would go get the provider now if they would stay a little longer. The patient stated he felt better and wanted to leave. This RN removed the patient IV.

## 2024-06-05 NOTE — ED Triage Notes (Signed)
 Pt POV reporting migraine x3 days, hx of same.

## 2024-06-05 NOTE — Telephone Encounter (Signed)
 [facility] Patient Advocate Encounter  Patient's medications CABENUVA  have been couriered to [facility] from [facility] and will be administered at the patients appointment on 06/07/24.  Charmaine Sharps, CPhT Specialty Pharmacy Patient [facility] for Infectious Disease Phone: [phone] Fax:  [phone]

## 2024-06-07 ENCOUNTER — Ambulatory Visit (INDEPENDENT_AMBULATORY_CARE_PROVIDER_SITE_OTHER): Admitting: Family

## 2024-06-07 ENCOUNTER — Ambulatory Visit

## 2024-06-07 ENCOUNTER — Encounter: Payer: Self-pay | Admitting: Family

## 2024-06-07 ENCOUNTER — Other Ambulatory Visit: Payer: Self-pay

## 2024-06-07 VITALS — BP 127/77 | HR 84 | Temp 97.1°F | Ht 67.0 in | Wt 164.8 lb

## 2024-06-07 DIAGNOSIS — A539 Syphilis, unspecified: Secondary | ICD-10-CM

## 2024-06-07 DIAGNOSIS — B20 Human immunodeficiency virus [HIV] disease: Secondary | ICD-10-CM

## 2024-06-07 DIAGNOSIS — Z0000 Encounter for general adult medical examination without abnormal findings: Secondary | ICD-10-CM

## 2024-06-07 MED ORDER — CABOTEGRAVIR & RILPIVIRINE ER 400 & 600 MG/2ML IM SUER
1.0000 | Freq: Once | INTRAMUSCULAR | Status: AC
  Administered 2024-06-07: 1 via INTRAMUSCULAR
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
## 2024-06-07 NOTE — Assessment & Plan Note (Signed)
 Discussed importance of safe sexual practice and condom use. Condoms and site specific STD testing offered.  Vaccinations reviewed and deferred following counseling.  Due for routine dental care which he will schedule.

## 2024-06-07 NOTE — Patient Instructions (Signed)
 Nice to see you.  We will check your lab work today.  Continue to take your medication daily as prescribed.  Refills have been sent to the pharmacy.  Plan for follow up in 6 months or sooner if needed with lab work on the same and pharmacy provider in between   Have a great day and stay safe!

## 2024-06-07 NOTE — Assessment & Plan Note (Signed)
 Syphilis titer of 1:4 previously and s/p Bicillin . Discussed nature of RPR testing. Check RPR.

## 2024-06-07 NOTE — Assessment & Plan Note (Signed)
 Mr. Villanueva continues to have well-controlled virus with good adherence and tolerance to Cabenuva .  Reviewed previous lab work and discussed plan of care and U equals U.  Covered by Medicaid.  Social determinants of health reviewed with no interventions indicated.  Check blood work.  Continue current dose of Cabenuva .  Plan for follow-up in 6 months or sooner if needed and with pharmacy provider in between.

## 2024-06-12 LAB — RPR TITER: RPR Titer: 1:2 {titer} — ABNORMAL HIGH

## 2024-06-12 LAB — T-HELPER CELLS (CD4) COUNT (NOT AT ARMC)
Absolute CD4: 843 {cells}/uL (ref 490–1740)
CD4 T Helper %: 41 % (ref 30–61)
Total lymphocyte count: 2055 {cells}/uL (ref 850–3900)

## 2024-06-12 LAB — HIV-1 RNA QUANT-NO REFLEX-BLD
HIV 1 RNA Quant: NOT DETECTED {copies}/mL
HIV-1 RNA Quant, Log: NOT DETECTED {Log_copies}/mL

## 2024-06-12 LAB — RPR: RPR Ser Ql: REACTIVE — AB

## 2024-06-12 LAB — T PALLIDUM AB: T Pallidum Abs: POSITIVE — AB

## 2024-06-12 NOTE — ED Provider Notes (Signed)
 Left without being seen. Labs without clinically significant electrolyte abnormalities, no leukocytosis.    Dreama Longs, MD 06/12/24 [phone]

## 2024-06-14 ENCOUNTER — Ambulatory Visit: Payer: Self-pay | Admitting: Family

## 2024-06-25 ENCOUNTER — Encounter: Payer: Self-pay | Admitting: Pharmacist

## 2024-06-26 ENCOUNTER — Other Ambulatory Visit: Payer: Self-pay

## 2024-06-26 ENCOUNTER — Other Ambulatory Visit (HOSPITAL_COMMUNITY): Payer: Self-pay

## 2024-06-26 NOTE — Progress Notes (Signed)
 Specialty Pharmacy Refill Coordination Note  Jay Smith is a 33 y.o. male assessed today regarding refills of clinic administered specialty medication(s) Cabotegravir  & Rilpivirine  (Cabenuva )   Clinic requested Courier to Provider Office   Delivery date: 07/02/24   Verified address: [address] [place] [address]   Medication will be filled on 07/01/24.

## 2024-07-02 ENCOUNTER — Telehealth: Payer: Self-pay

## 2024-07-02 NOTE — Telephone Encounter (Signed)
 [facility] Patient Advocate Encounter  Patient's medications CABENUVA  have been couriered to [facility] from [facility] and will be administered at the patients appointment on 07/04/24.  Charmaine Sharps, CPhT Specialty Pharmacy Patient [facility] for Infectious Disease Phone: [phone] Fax:  [phone]

## 2024-07-03 NOTE — Progress Notes (Unsigned)
 HPI: Jay Smith is a 33 y.o. male who presents to the [facility] pharmacy clinic for Cabenuva  administration.  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   LIDOCAINE  (LIDODERM ) 5 %    Place 1 patch onto the skin daily. Remove & Discard patch within 12 hours or as directed by MD  Modified Medications   No medications on file  Discontinued Medications   No medications on file    Allergies: Allergies  Allergen Reactions   Dovato  [Dolutegravir-Lamivudine] Other (See Comments)    Hypersensitivity reaction with diarrhea, skin peeling and headache.    Hydrocodone Swelling    Labs: Lab Results  Component Value Date   HIV1RNAQUANT NOT DETECTED 06/07/2024   HIV1RNAQUANT Not Detected 12/07/2023   HIV1RNAQUANT Not Detected 05/15/2023   CD4TABS 930 12/07/2023   CD4TABS 707 05/15/2023   CD4TABS 1,111 08/11/2022    RPR and STI Lab Results  Component Value Date   LABRPR REACTIVE (A) 06/07/2024   LABRPR REACTIVE (A) 11/10/2023   LABRPR REACTIVE (A) 05/15/2023   LABRPR NON-REACTIVE 10/13/2022   LABRPR REACTIVE (A) 02/23/2022   RPRTITER 1:2 (H) 06/07/2024   RPRTITER 1:4 (H) 11/10/2023   RPRTITER 1:1 (H) 05/15/2023   RPRTITER 1:1 (H) 02/23/2022   RPRTITER 1:1 (H) 08/16/2021    STI Results GC CT  06/06/2023 10:19 AM Negative    Negative    Negative  Negative    Negative    Negative   05/15/2023 11:33 AM Negative  Negative   10/13/2022  3:35 PM Negative  Negative   05/21/2021  8:59 AM Negative   Negative   12/07/2020  4:19 PM Negative  Negative   06/01/2018 12:00 AM Negative  Negative   02/12/2015 12:00 AM Negative  Negative     Hepatitis B Lab Results  Component Value Date   HEPBSAB BORDERLINE (A) 06/01/2018   HEPBSAG NON-REACTIVE 06/01/2018   HEPBCAB NON-REACTIVE 06/01/2018   Hepatitis C Lab Results  Component Value Date   HEPCAB NON-REACTIVE 06/01/2018   Hepatitis A Lab Results  Component Value Date   HAV NON-REACTIVE 06/01/2018   Lipids: Lab Results  Component Value Date   CHOL 193 04/18/2019   TRIG 234 (H) 04/18/2019   HDL 31 (L) 04/18/2019   CHOLHDL 6.2 (H) 04/18/2019   LDLCALC 124 (H) 04/18/2019    TARGET DATE: The 31st  Assessment: Jay Smith presents today for his maintenance Cabenuva  injections. Past injections were tolerated well without issues. Last HIV RNA was undetectable in August. Will defer HIV RNA today. Doing well with no issues today.  Administered cabotegravir  400mg /24mL in left upper outer quadrant of the gluteal muscle. Administered rilpivirine  600 mg/2mL in the right upper outer quadrant of the gluteal muscle. No issues with injections. He will follow up in 1 month for next set of injections.  Doing well on cab? Defer HIV RNA today Labs: Lipids - if willing Vax: hepA, flu, PCV20, Shingrix, HPV, meningitis series Scheduled for 10/30 with Jay Smith, 11/25 with Jay Smith  Schedule for December and Jan with pharm Return to [place] end of Feb per his note  Plan: - Cabenuva  injections administered - Next injections scheduled for *** - Call with any issues or questions  Izetta Carl, PharmD PGY1 Pharmacy Resident

## 2024-07-04 ENCOUNTER — Ambulatory Visit (INDEPENDENT_AMBULATORY_CARE_PROVIDER_SITE_OTHER): Admitting: Pharmacist

## 2024-07-04 ENCOUNTER — Other Ambulatory Visit: Payer: Self-pay

## 2024-07-04 DIAGNOSIS — B20 Human immunodeficiency virus [HIV] disease: Secondary | ICD-10-CM

## 2024-07-04 MED ORDER — CABOTEGRAVIR & RILPIVIRINE ER 400 & 600 MG/2ML IM SUER
1.0000 | Freq: Once | INTRAMUSCULAR | Status: AC
  Administered 2024-07-04: 1 via INTRAMUSCULAR

## 2024-07-24 ENCOUNTER — Other Ambulatory Visit (HOSPITAL_COMMUNITY): Payer: Self-pay

## 2024-07-24 ENCOUNTER — Other Ambulatory Visit: Payer: Self-pay

## 2024-07-24 NOTE — Progress Notes (Signed)
 Specialty Pharmacy Refill Coordination Note  KAYVON MO is a 33 y.o. male assessed today regarding refills of clinic administered specialty medication(s) Cabotegravir  & Rilpivirine  (Cabenuva )   Clinic requested Courier to Provider Office   Delivery date: 08/05/24   Verified address: [address] [place] [address]   Medication will be filled on 08/02/24.

## 2024-08-02 ENCOUNTER — Other Ambulatory Visit: Payer: Self-pay

## 2024-08-05 ENCOUNTER — Telehealth: Payer: Self-pay

## 2024-08-05 NOTE — Telephone Encounter (Signed)
 [facility] Patient Advocate Encounter  Patient's medications Cabenuva  have been couriered to [facility] from [facility] and will be administered at the patients appointment on 08/08/24.  Arland Hutchinson, CPhT Specialty Pharmacy Patient [facility] for Infectious Disease Phone: [phone] Fax:  [phone]

## 2024-08-06 NOTE — Progress Notes (Unsigned)
 HPI: Jay Smith is a 33 y.o. male who presents to the [facility] pharmacy clinic for Cabenuva  administration.  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   LIDOCAINE  (LIDODERM ) 5 %    Place 1 patch onto the skin daily. Remove & Discard patch within 12 hours or as directed by MD  Modified Medications   No medications on file  Discontinued Medications   No medications on file    Allergies: Allergies  Allergen Reactions   Dovato  [Dolutegravir-Lamivudine] Other (See Comments)    Hypersensitivity reaction with diarrhea, skin peeling and headache.    Hydrocodone Swelling    Past Medical History: Past Medical History:  Diagnosis Date   Asthma    Eczema    HIV infection (HCC)     Social History: Social History   Socioeconomic History   Marital status: Single    Spouse name: Not on file   Number of children: Not on file   Years of education: Not on file   Highest education level: Not on file  Occupational History   Occupation: Heritage Manager  Tobacco Use   Smoking status: Never   Smokeless tobacco: Never  Vaping Use   Vaping status: Never Used  Substance and Sexual Activity   Alcohol use: Yes    Comment: rare   Drug use: Yes    Types: Marijuana    Comment: socially   Sexual activity: Yes    Partners: Male    Birth control/protection: Condom    Comment: declined condoms  Other Topics Concern   Not on file  Social History Narrative   Not on  file   Social Drivers of Health   Financial Resource Strain: Not on file  Food Insecurity: Not on file  Transportation Needs: Not on file  Physical Activity: Not on file  Stress: Not on file  Social Connections: Unknown (04/28/2023)   Received from [facility]   Social Network    Social Network: Not on file    Labs: Lab Results  Component Value Date   HIV1RNAQUANT NOT DETECTED 06/07/2024   HIV1RNAQUANT Not Detected 12/07/2023   HIV1RNAQUANT Not Detected 05/15/2023   CD4TABS 930 12/07/2023   CD4TABS 707 05/15/2023   CD4TABS 1,111 08/11/2022    RPR and STI Lab Results  Component Value Date   LABRPR REACTIVE (A) 06/07/2024   LABRPR REACTIVE (A) 11/10/2023   LABRPR REACTIVE (A) 05/15/2023   LABRPR NON-REACTIVE 10/13/2022   LABRPR REACTIVE (A) 02/23/2022   RPRTITER 1:2 (H) 06/07/2024   RPRTITER 1:4 (H) 11/10/2023   RPRTITER 1:1 (H) 05/15/2023   RPRTITER 1:1 (H) 02/23/2022   RPRTITER 1:1 (H) 08/16/2021    STI Results GC CT  06/06/2023 10:19 AM Negative    Negative    Negative  Negative    Negative    Negative   05/15/2023 11:33 AM Negative  Negative   10/13/2022  3:35 PM Negative  Negative   05/21/2021  8:59 AM Negative  Negative   12/07/2020  4:19 PM Negative  Negative   06/01/2018 12:00 AM Negative  Negative   02/12/2015 12:00 AM Negative  Negative     Hepatitis B Lab Results  Component Value Date   HEPBSAB BORDERLINE (A) 06/01/2018   HEPBSAG NON-REACTIVE 06/01/2018   HEPBCAB NON-REACTIVE 06/01/2018   Hepatitis C Lab Results  Component Value Date   HEPCAB NON-REACTIVE 06/01/2018   Hepatitis A Lab Results  Component Value Date   HAV NON-REACTIVE 06/01/2018   Lipids: Lab Results  Component Value Date   CHOL 193 04/18/2019   TRIG 234 (H) 04/18/2019   HDL 31 (L) 04/18/2019   CHOLHDL 6.2 (H) 04/18/2019   LDLCALC 124 (H) 04/18/2019    TARGET DATE:  The 31st of the month  Assessment: [name] presents today for their maintenance Cabenuva   injections. Initial/past injections were tolerated well without issues. No problems with systemic effects of injections.   Administered cabotegravir  400mg /1mL in left upper outer quadrant of the gluteal muscle. Administered rilpivirine  600 mg/2mL in the right upper outer quadrant of the gluteal muscle. Monitored patient for 10 minutes after injection. Injections were tolerated well without issue. Patient will follow up in one month for next injection.  Eligible for flu, COVID, PCV20, Menveo, HAV, HPV, and Shingrix vaccines which he continues to decline.   Plan: - Administer Cabenuva  injections  - Next injections scheduled for 11/25 with Cassie  - Call with any issues or questions  Alan Geralds, PharmD, CPP, BCIDP, AAHIVP Clinical Pharmacist Practitioner Infectious Diseases Clinical Pharmacist [facility]

## 2024-08-08 ENCOUNTER — Ambulatory Visit: Payer: Self-pay | Admitting: Pharmacist

## 2024-08-08 ENCOUNTER — Other Ambulatory Visit: Payer: Self-pay

## 2024-08-08 DIAGNOSIS — Z113 Encounter for screening for infections with a predominantly sexual mode of transmission: Secondary | ICD-10-CM

## 2024-08-08 DIAGNOSIS — B20 Human immunodeficiency virus [HIV] disease: Secondary | ICD-10-CM

## 2024-08-08 MED ORDER — CABOTEGRAVIR & RILPIVIRINE ER 400 & 600 MG/2ML IM SUER
1.0000 | Freq: Once | INTRAMUSCULAR | Status: AC
  Administered 2024-08-08: 1 via INTRAMUSCULAR

## 2024-08-21 ENCOUNTER — Other Ambulatory Visit: Payer: Self-pay

## 2024-08-21 ENCOUNTER — Other Ambulatory Visit: Payer: Self-pay | Admitting: Pharmacist

## 2024-08-21 ENCOUNTER — Other Ambulatory Visit (HOSPITAL_COMMUNITY): Payer: Self-pay

## 2024-08-21 DIAGNOSIS — B20 Human immunodeficiency virus [HIV] disease: Secondary | ICD-10-CM

## 2024-08-21 MED ORDER — CABENUVA 400 & 600 MG/2ML IM SUER
1.0000 | INTRAMUSCULAR | 11 refills | Status: AC
  Filled 2024-08-21: qty 4, 30d supply, fill #0
  Filled 2024-09-12: qty 4, 30d supply, fill #1
  Filled 2024-10-22: qty 4, 30d supply, fill #2

## 2024-08-21 NOTE — Progress Notes (Signed)
 Specialty Pharmacy Refill Coordination Note  Jay Smith is a 33 y.o. male assessed today regarding refills of clinic administered specialty medication(s) Cabotegravir  & Rilpivirine  (Cabenuva )   Clinic requested Courier to Provider Office   Delivery date: 09/02/24   Verified address: [address] [place] [address]   Medication will be filled on 08/30/24.

## 2024-08-29 ENCOUNTER — Other Ambulatory Visit: Payer: Self-pay

## 2024-08-30 ENCOUNTER — Other Ambulatory Visit: Payer: Self-pay

## 2024-09-02 ENCOUNTER — Telehealth: Payer: Self-pay

## 2024-09-02 NOTE — Progress Notes (Unsigned)
 HPI: Jay Smith is a 33 y.o. male who presents to the [facility] pharmacy clinic for Cabenuva  administration.  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   LIDOCAINE  (LIDODERM ) 5 %    Place 1 patch onto the skin daily. Remove & Discard patch within 12 hours or as directed by MD  Modified Medications   No medications on file  Discontinued Medications   No medications on file    Allergies: Allergies  Allergen Reactions   Dovato  [Dolutegravir-Lamivudine] Other (See Comments)    Hypersensitivity reaction with diarrhea, skin peeling and headache.    Hydrocodone Swelling    Labs: Lab Results  Component Value Date   HIV1RNAQUANT NOT DETECTED 06/07/2024   HIV1RNAQUANT Not Detected 12/07/2023   HIV1RNAQUANT Not Detected 05/15/2023   CD4TABS 930 12/07/2023   CD4TABS 707 05/15/2023   CD4TABS 1,111 08/11/2022    RPR and STI Lab Results  Component Value Date   LABRPR REACTIVE (A) 06/07/2024   LABRPR REACTIVE (A) 11/10/2023   LABRPR REACTIVE (A) 05/15/2023   LABRPR NON-REACTIVE 10/13/2022   LABRPR REACTIVE (A) 02/23/2022   RPRTITER 1:2 (H) 06/07/2024   RPRTITER 1:4 (H) 11/10/2023   RPRTITER 1:1 (H) 05/15/2023   RPRTITER 1:1 (H) 02/23/2022   RPRTITER 1:1 (H) 08/16/2021    STI Results GC CT  06/06/2023 10:19 AM Negative    Negative    Negative  Negative    Negative    Negative   05/15/2023 11:33 AM Negative  Negative   10/13/2022  3:35 PM Negative   Negative   05/21/2021  8:59 AM Negative  Negative   12/07/2020  4:19 PM Negative  Negative   06/01/2018 12:00 AM Negative  Negative   02/12/2015 12:00 AM Negative  Negative     Hepatitis B Lab Results  Component Value Date   HEPBSAB BORDERLINE (A) 06/01/2018   HEPBSAG NON-REACTIVE 06/01/2018   HEPBCAB NON-REACTIVE 06/01/2018   Hepatitis C Lab Results  Component Value Date   HEPCAB NON-REACTIVE 06/01/2018   Hepatitis A Lab Results  Component Value Date   HAV NON-REACTIVE 06/01/2018   Lipids: Lab Results  Component Value Date   CHOL 193 04/18/2019   TRIG 234 (H) 04/18/2019   HDL 31 (L) 04/18/2019   CHOLHDL 6.2 (H) 04/18/2019   LDLCALC 124 (H) 04/18/2019    Target Date: 31st   Assessment: [place] presents today for their maintenance Cabenuva  injections. Past injections were tolerated well without issues. Last HIV RNA was undetectable on 06/07/2024. Doing well with no issues today.  Lab work:  ***Oral/urine/rectal cytologies for GC/Chlamydia, RPR ***   Eligible vaccinations: Influenza, Covid, PCV20, Menveo 1/2, HPV 1/3, Havrix 1/2, Shingrix 1/2 ***  Cabenuva : Administered cabotegravir  400mg /2mL in left upper outer quadrant of the gluteal muscle. Administered rilpivirine  600mg /61mL in the right upper outer quadrant of the gluteal muscle. No issues with injections. Jay Smith will follow up in 1 month for next set of injections.  Plan: - Cabenuva  injections administered - Oral/urine/rectal cytologies for GC/Chlamydia, RPR *** - Next injections scheduled for 10/08/2024 and 11/05/2024 with Alan - Call with any issues or questions  Feliciano Close, PharmD PGY2 Infectious Diseases Pharmacy Resident

## 2024-09-02 NOTE — Telephone Encounter (Signed)
 [facility] Patient Advocate Encounter  Patient's medications Cabenuva  have been couriered to [facility] from [facility] and will be administered at the patients appointment on 09/03/24.  Arland Hutchinson, CPhT Specialty Pharmacy Patient [facility] for Infectious Disease Phone: [phone] Fax:  [phone]

## 2024-09-03 ENCOUNTER — Other Ambulatory Visit: Payer: Self-pay

## 2024-09-03 ENCOUNTER — Encounter: Payer: Self-pay | Admitting: Pharmacist

## 2024-09-03 ENCOUNTER — Ambulatory Visit: Admitting: Pharmacist

## 2024-09-03 DIAGNOSIS — Z113 Encounter for screening for infections with a predominantly sexual mode of transmission: Secondary | ICD-10-CM

## 2024-09-03 DIAGNOSIS — B20 Human immunodeficiency virus [HIV] disease: Secondary | ICD-10-CM

## 2024-09-03 MED ORDER — CABOTEGRAVIR & RILPIVIRINE ER 400 & 600 MG/2ML IM SUER
1.0000 | Freq: Once | INTRAMUSCULAR | Status: AC
  Administered 2024-09-03: 1 via INTRAMUSCULAR

## 2024-09-06 LAB — SYPHILIS: RPR W/REFLEX TO RPR TITER AND TREPONEMAL ANTIBODIES, TRADITIONAL SCREENING AND DIAGNOSIS ALGORITHM: RPR Ser Ql: REACTIVE — AB

## 2024-09-06 LAB — T PALLIDUM AB: T Pallidum Abs: POSITIVE — AB

## 2024-09-06 LAB — RPR TITER: RPR Titer: 1:4 {titer} — ABNORMAL HIGH

## 2024-09-10 ENCOUNTER — Ambulatory Visit: Payer: Self-pay | Admitting: Pharmacist

## 2024-09-12 ENCOUNTER — Other Ambulatory Visit: Payer: Self-pay

## 2024-09-12 ENCOUNTER — Other Ambulatory Visit (HOSPITAL_COMMUNITY): Payer: Self-pay

## 2024-09-12 NOTE — Progress Notes (Signed)
 Patient's refill has been scheduled in [place].

## 2024-09-12 NOTE — Progress Notes (Signed)
 Specialty Pharmacy Refill Coordination Note  Jay Smith is a 33 y.o. male assessed today regarding refills of clinic administered specialty medication(s) Cabotegravir  & Rilpivirine  (Cabenuva )   Clinic requested Courier to Provider Office   Delivery date: 09/30/24   Verified address: [address] [place] [address]   Medication will be filled on 09/27/24.

## 2024-09-27 ENCOUNTER — Other Ambulatory Visit: Payer: Self-pay

## 2024-09-27 NOTE — Progress Notes (Addendum)
 "  HPI: Jay Smith is a 33 y.o. male who presents to the [facility] pharmacy clinic for Cabenuva  administration.  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   LIDOCAINE  (LIDODERM ) 5 %    Place 1 patch onto the skin daily. Remove & Discard patch within 12 hours or as directed by MD  Modified Medications   No medications on file  Discontinued Medications   No medications on file    Allergies: Allergies[1]  Past Medical History: Past Medical History:  Diagnosis Date   Asthma    Eczema    HIV infection (HCC)     Social History: Social History   Socioeconomic History   Marital status: Single    Spouse name: Not on file   Number of children: Not on file   Years of education: Not on file   Highest education level: Not on file  Occupational History   Occupation: [employer] Manager  Tobacco Use   Smoking status: Never   Smokeless tobacco: Never  Vaping Use   Vaping status: Never Used  Substance and Sexual Activity   Alcohol use: Yes    Comment: rare   Drug use: Yes    Types: Marijuana    Comment: socially   Sexual activity: Yes    Partners: Male    Birth control/protection: Condom    Comment: declined condoms  Other Topics Concern   Not on file  Social History Narrative   Not on file   Social Drivers of Health   Tobacco Use: Low Risk (06/07/2024)   Patient History    Smoking Tobacco Use: Never    Smokeless Tobacco Use: Never    Passive Exposure:  Not on file  Financial Resource Strain: Not on file  Food Insecurity: Not on file  Transportation Needs: Not on file  Physical Activity: Not on file  Stress: Not on file  Social Connections: Unknown (04/28/2023)   Received from [facility]   Social Network    Social Network: Not on file  Depression (PHQ2-9): Low Risk (06/07/2024)   Depression (PHQ2-9)    PHQ-2 Score: 3  Alcohol Screen: Not on file  Housing: Not on file  Utilities: Not on file  Health Literacy: Not on file    Labs: Lab Results  Component Value Date   HIV1RNAQUANT NOT DETECTED 06/07/2024   HIV1RNAQUANT Not Detected 12/07/2023   HIV1RNAQUANT Not Detected 05/15/2023   CD4TABS 930 12/07/2023   CD4TABS 707 05/15/2023   CD4TABS 1,111 08/11/2022    RPR and STI Lab Results  Component Value Date   LABRPR REACTIVE (A) 09/03/2024   LABRPR REACTIVE (A) 06/07/2024   LABRPR REACTIVE (A) 11/10/2023   LABRPR REACTIVE (A) 05/15/2023   LABRPR NON-REACTIVE 10/13/2022   RPRTITER 1:4 (H) 09/03/2024   RPRTITER 1:2 (H) 06/07/2024   RPRTITER 1:4 (H) 11/10/2023   RPRTITER 1:1 (H) 05/15/2023   RPRTITER 1:1 (H) 02/23/2022    STI Results GC CT  06/06/2023 10:19 AM Negative    Negative    Negative  Negative    Negative    Negative   05/15/2023 11:33 AM Negative  Negative   10/13/2022  3:35 PM Negative  Negative   05/21/2021  8:59 AM Negative  Negative   12/07/2020  4:19 PM Negative  Negative   06/01/2018 12:00 AM Negative  Negative   02/12/2015 12:00 AM Negative  Negative     Hepatitis B Lab Results  Component Value Date   HEPBSAB BORDERLINE (A) 06/01/2018   HEPBSAG NON-REACTIVE 06/01/2018   HEPBCAB NON-REACTIVE 06/01/2018   Hepatitis C Lab Results  Component Value Date   HEPCAB NON-REACTIVE 06/01/2018   Hepatitis A Lab Results  Component Value Date   HAV NON-REACTIVE 06/01/2018   Lipids: Lab Results  Component Value Date   CHOL 193 04/18/2019   TRIG 234 (H) 04/18/2019   HDL 31 (L) 04/18/2019    CHOLHDL 6.2 (H) 04/18/2019   LDLCALC 124 (H) 04/18/2019    TARGET DATE:  The 31st of the month  Assessment: [place] presents today for their maintenance Cabenuva  injections. Initial/past injections were tolerated well without issues. No problems with systemic effects of injections.   Administered cabotegravir  400mg /71mL in left upper outer quadrant of the gluteal muscle. Administered rilpivirine  600 mg/2mL in the right upper outer quadrant of the gluteal muscle. Monitored patient for 10 minutes after injection. Injections were tolerated well without issue. Patient will follow up in one month for next injections.  Patient would like to repeat RPR today given it was slightly increased last time. No symptoms or new partners noted. Reviewed the RPR would need to increase to 1:8 to call it a new infection at this point.   Plan: - Administer Cabenuva  injections  - Check RPR  - Next injections scheduled for 1/27 and 2/26 with me  - Call with any issues or questions  Alan Geralds, PharmD, CPP, BCIDP, AAHIVP Clinical Pharmacist Practitioner Infectious Diseases Clinical Pharmacist [facility]      [1]  Allergies Allergen Reactions   Dovato  [Dolutegravir-Lamivudine] Other (See Comments)    Hypersensitivity reaction with diarrhea, skin peeling and headache.    Hydrocodone Swelling   "

## 2024-09-30 ENCOUNTER — Telehealth: Payer: Self-pay

## 2024-09-30 NOTE — Telephone Encounter (Signed)
 [facility] Patient Advocate Encounter  Patient's medications CABENUVA  have been couriered to [facility] from [facility] and will be administered at the patients appointment on 10/08/24.  Charmaine Sharps, CPhT Specialty Pharmacy Patient [facility] for Infectious Disease Phone: [phone] Fax:  [phone]

## 2024-10-08 ENCOUNTER — Ambulatory Visit (INDEPENDENT_AMBULATORY_CARE_PROVIDER_SITE_OTHER): Admitting: Pharmacist

## 2024-10-08 ENCOUNTER — Other Ambulatory Visit: Payer: Self-pay

## 2024-10-08 DIAGNOSIS — B20 Human immunodeficiency virus [HIV] disease: Secondary | ICD-10-CM | POA: Diagnosis present

## 2024-10-08 DIAGNOSIS — Z113 Encounter for screening for infections with a predominantly sexual mode of transmission: Secondary | ICD-10-CM

## 2024-10-08 MED ORDER — CABOTEGRAVIR & RILPIVIRINE ER 400 & 600 MG/2ML IM SUER
1.0000 | Freq: Once | INTRAMUSCULAR | Status: AC
  Administered 2024-10-08: 1 via INTRAMUSCULAR

## 2024-10-08 MED ORDER — CABOTEGRAVIR & RILPIVIRINE ER 600 & 900 MG/3ML IM SUER: 1.0000 | Freq: Once | INTRAMUSCULAR | Status: DC

## 2024-10-08 NOTE — Addendum Note (Signed)
 Addended by: WADDELL ALAN PARAS on: 10/08/2024 11:04 AM   Modules accepted: Orders

## 2024-10-22 ENCOUNTER — Other Ambulatory Visit (HOSPITAL_COMMUNITY): Payer: Self-pay

## 2024-10-22 ENCOUNTER — Other Ambulatory Visit: Payer: Self-pay

## 2024-10-22 NOTE — Progress Notes (Signed)
 Specialty Pharmacy Refill Coordination Note  Jay [place] is a 34 y.o. male assessed today regarding refills of clinic administered specialty medication(s) Cabotegravir  & Rilpivirine  (Cabenuva )   Clinic requested Courier to Provider Office   Delivery date: 10/28/24   Verified address: [address] [place] [address]   Medication will be filled on 10/25/24.

## 2024-10-25 ENCOUNTER — Other Ambulatory Visit: Payer: Self-pay

## 2024-10-28 ENCOUNTER — Telehealth: Payer: Self-pay

## 2024-10-28 NOTE — Telephone Encounter (Signed)
 [facility] Patient Advocate Encounter  Patient's medications Cabenuva  have been couriered to [facility] from [facility] and will be administered at the patients appointment on 11/05/24.  Arland Hutchinson, CPhT Specialty Pharmacy Patient [facility] for Infectious Disease Phone: [phone] Fax:  [phone]

## 2024-11-04 ENCOUNTER — Encounter: Payer: Self-pay | Admitting: Pharmacist

## 2024-11-04 NOTE — Progress Notes (Unsigned)
 "  HPI: SHAHIEM BEDWELL is a 34 y.o. male who presents to the [facility] pharmacy clinic for Cabenuva  administration.  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   LIDOCAINE  (LIDODERM ) 5 %    Place 1 patch onto the skin daily. Remove & Discard patch within 12 hours or as directed by MD  Modified Medications   No medications on file  Discontinued Medications   No medications on file    Allergies: Allergies[1]  Labs: Lab Results  Component Value Date   HIV1RNAQUANT NOT DETECTED 06/07/2024   HIV1RNAQUANT Not Detected 12/07/2023   HIV1RNAQUANT Not Detected 05/15/2023   CD4TABS 930 12/07/2023   CD4TABS 707 05/15/2023   CD4TABS 1,111 08/11/2022    RPR and STI Lab Results  Component Value Date   LABRPR REACTIVE (A) 09/03/2024   LABRPR REACTIVE (A) 06/07/2024   LABRPR REACTIVE (A) 11/10/2023   LABRPR REACTIVE (A) 05/15/2023   LABRPR NON-REACTIVE 10/13/2022   RPRTITER 1:4 (H) 09/03/2024   RPRTITER 1:2 (H) 06/07/2024   RPRTITER 1:4 (H) 11/10/2023   RPRTITER 1:1 (H) 05/15/2023   RPRTITER 1:1 (H) 02/23/2022    STI Results GC CT  06/06/2023 10:19 AM Negative    Negative    Negative  Negative    Negative    Negative   05/15/2023 11:33 AM Negative  Negative   10/13/2022  3:35 PM Negative  Negative   05/21/2021  8:59 AM Negative  Negative   12/07/2020  4:19 PM Negative  Negative   06/01/2018 12:00 AM Negative  Negative   02/12/2015 12:00 AM Negative  Negative      Hepatitis B Lab Results  Component Value Date   HEPBSAB BORDERLINE (A) 06/01/2018   HEPBSAG NON-REACTIVE 06/01/2018   HEPBCAB NON-REACTIVE 06/01/2018   Hepatitis C Lab Results  Component Value Date   HEPCAB NON-REACTIVE 06/01/2018   Hepatitis A Lab Results  Component Value Date   HAV NON-REACTIVE 06/01/2018   Lipids: Lab Results  Component Value Date   CHOL 193 04/18/2019   TRIG 234 (H) 04/18/2019   HDL 31 (L) 04/18/2019   CHOLHDL 6.2 (H) 04/18/2019   LDLCALC 124 (H) 04/18/2019    Target Date: 31st  Assessment: [name] presents today for his maintenance Cabenuva  injections. Past injections were tolerated well without issues. Last HIV RNA was undetectable in August. Doing well with no issues today.  Lab work:  - Due for HIV RNA/annual labs at next visit - Overdue for lipid panel but could draw with other labs at next visit - Offered STI testing; accepts urine STI and RPR testing  Eligible vaccinations:  PCV20, MenACWY, flu, HPV, Shingles, HAV; declines all today  Cabenuva : Administered cabotegravir  400mg /35mL in left upper outer quadrant of the gluteal muscle. Administered rilpivirine  600mg /63mL in the right upper outer quadrant of the gluteal muscle. No issues with injections. [name] will follow up in 1 month for next set of injections.  Plan: - Cabenuva  injections administered -  Urine STI testing and RPR test - Next injections scheduled for 2/26 with Alan - Call with any issues or questions  Maurilio Patten, PharmD PGY1 Pharmacy Resident [facility] 11/04/2024      [1]  Allergies Allergen Reactions   Dovato  [Dolutegravir-Lamivudine] Other (See Comments)    Hypersensitivity reaction with diarrhea, skin peeling and headache.    Hydrocodone Swelling   "

## 2024-11-05 ENCOUNTER — Other Ambulatory Visit (HOSPITAL_COMMUNITY)
Admission: RE | Admit: 2024-11-05 | Discharge: 2024-11-05 | Disposition: A | Discharge status: Home / Self Care | Source: Ambulatory Visit | Attending: Family | Admitting: Family

## 2024-11-05 ENCOUNTER — Encounter: Admitting: Pharmacist

## 2024-11-05 ENCOUNTER — Other Ambulatory Visit: Payer: Self-pay

## 2024-11-05 ENCOUNTER — Ambulatory Visit: Admitting: Pharmacist

## 2024-11-05 DIAGNOSIS — Z113 Encounter for screening for infections with a predominantly sexual mode of transmission: Secondary | ICD-10-CM

## 2024-11-05 DIAGNOSIS — B20 Human immunodeficiency virus [HIV] disease: Secondary | ICD-10-CM

## 2024-11-05 MED ORDER — CABOTEGRAVIR & RILPIVIRINE ER 400 & 600 MG/2ML IM SUER
1.0000 | Freq: Once | INTRAMUSCULAR | Status: AC
  Administered 2024-11-05: 1 via INTRAMUSCULAR

## 2024-11-05 NOTE — Progress Notes (Signed)
 SABRA

## 2024-11-06 LAB — URINE CYTOLOGY ANCILLARY ONLY
Chlamydia: NEGATIVE
Comment: NEGATIVE
Comment: NORMAL
Neisseria Gonorrhea: NEGATIVE

## 2024-11-07 LAB — RPR TITER: RPR Titer: 1:2 {titer} — ABNORMAL HIGH

## 2024-11-07 LAB — SYPHILIS: RPR W/REFLEX TO RPR TITER AND TREPONEMAL ANTIBODIES, TRADITIONAL SCREENING AND DIAGNOSIS ALGORITHM: RPR Ser Ql: REACTIVE — AB

## 2024-11-07 LAB — T PALLIDUM AB: T Pallidum Abs: POSITIVE — AB

## 2024-12-05 ENCOUNTER — Encounter: Admitting: Pharmacist

## 2025-01-02 ENCOUNTER — Ambulatory Visit: Payer: Self-pay | Admitting: Pharmacist
